# Patient Record
Sex: Male | Born: 1935 | Race: Black or African American | Hispanic: No | Marital: Married | Smoking: Never smoker
Health system: Southern US, Community
[De-identification: ages and names within clinical notes are randomized; demographics above are authoritative.]

## PROBLEM LIST (undated history)

## (undated) DIAGNOSIS — I651 Occlusion and stenosis of basilar artery: Secondary | ICD-10-CM

## (undated) DIAGNOSIS — R296 Repeated falls: Secondary | ICD-10-CM

## (undated) DIAGNOSIS — I639 Cerebral infarction, unspecified: Secondary | ICD-10-CM

## (undated) DIAGNOSIS — Q892 Congenital malformations of other endocrine glands: Secondary | ICD-10-CM

## (undated) DIAGNOSIS — IMO0002 Reserved for concepts with insufficient information to code with codable children: Secondary | ICD-10-CM

## (undated) DIAGNOSIS — D649 Anemia, unspecified: Secondary | ICD-10-CM

## (undated) DIAGNOSIS — I663 Occlusion and stenosis of cerebellar arteries: Secondary | ICD-10-CM

## (undated) DIAGNOSIS — F05 Delirium due to known physiological condition: Secondary | ICD-10-CM

## (undated) DIAGNOSIS — I10 Essential (primary) hypertension: Secondary | ICD-10-CM

## (undated) DIAGNOSIS — I6629 Occlusion and stenosis of unspecified posterior cerebral artery: Secondary | ICD-10-CM

## (undated) DIAGNOSIS — Z66 Do not resuscitate: Secondary | ICD-10-CM

## (undated) DIAGNOSIS — G9340 Encephalopathy, unspecified: Secondary | ICD-10-CM

## (undated) DIAGNOSIS — D696 Thrombocytopenia, unspecified: Secondary | ICD-10-CM

## (undated) DIAGNOSIS — F0390 Unspecified dementia without behavioral disturbance: Secondary | ICD-10-CM

## (undated) DIAGNOSIS — G5630 Lesion of radial nerve, unspecified upper limb: Secondary | ICD-10-CM

## (undated) DIAGNOSIS — R945 Abnormal results of liver function studies: Secondary | ICD-10-CM

## (undated) DIAGNOSIS — I2510 Atherosclerotic heart disease of native coronary artery without angina pectoris: Secondary | ICD-10-CM

## (undated) DIAGNOSIS — R7989 Other specified abnormal findings of blood chemistry: Secondary | ICD-10-CM

## (undated) DIAGNOSIS — G2401 Drug induced subacute dyskinesia: Secondary | ICD-10-CM

## (undated) DIAGNOSIS — K831 Obstruction of bile duct: Secondary | ICD-10-CM

## (undated) HISTORY — PX: CORONARY ANGIOPLASTY: SHX604

## (undated) HISTORY — PX: CARDIAC SURGERY: SHX584

---

## 2002-08-25 ENCOUNTER — Inpatient Hospital Stay (HOSPITAL_COMMUNITY): Admission: EM | Admit: 2002-08-25 | Discharge: 2002-08-27 | Payer: Self-pay | Admitting: Emergency Medicine

## 2002-08-25 ENCOUNTER — Encounter: Payer: Self-pay | Admitting: Emergency Medicine

## 2002-08-26 ENCOUNTER — Encounter: Payer: Self-pay | Admitting: Family Medicine

## 2002-08-26 ENCOUNTER — Encounter: Payer: Self-pay | Admitting: Neurology

## 2002-08-28 ENCOUNTER — Encounter (HOSPITAL_COMMUNITY): Admission: RE | Admit: 2002-08-28 | Discharge: 2002-09-27 | Payer: Self-pay | Admitting: Family Medicine

## 2002-09-30 ENCOUNTER — Encounter (HOSPITAL_COMMUNITY): Admission: RE | Admit: 2002-09-30 | Discharge: 2002-10-30 | Payer: Self-pay | Admitting: Family Medicine

## 2003-02-24 ENCOUNTER — Ambulatory Visit (HOSPITAL_COMMUNITY): Admission: RE | Admit: 2003-02-24 | Discharge: 2003-02-24 | Payer: Self-pay | Admitting: Family Medicine

## 2003-09-25 ENCOUNTER — Ambulatory Visit (HOSPITAL_COMMUNITY): Admission: RE | Admit: 2003-09-25 | Discharge: 2003-09-25 | Payer: Self-pay | Admitting: Family Medicine

## 2004-03-23 ENCOUNTER — Ambulatory Visit (HOSPITAL_COMMUNITY): Admission: RE | Admit: 2004-03-23 | Discharge: 2004-03-23 | Payer: Self-pay | Admitting: Family Medicine

## 2004-08-12 ENCOUNTER — Ambulatory Visit: Payer: Self-pay | Admitting: Internal Medicine

## 2004-08-24 ENCOUNTER — Ambulatory Visit (HOSPITAL_COMMUNITY): Admission: RE | Admit: 2004-08-24 | Discharge: 2004-08-24 | Payer: Self-pay | Admitting: Internal Medicine

## 2004-08-24 ENCOUNTER — Encounter (INDEPENDENT_AMBULATORY_CARE_PROVIDER_SITE_OTHER): Payer: Self-pay | Admitting: Internal Medicine

## 2004-08-24 ENCOUNTER — Ambulatory Visit: Payer: Self-pay | Admitting: Internal Medicine

## 2004-08-26 ENCOUNTER — Ambulatory Visit (HOSPITAL_COMMUNITY): Admission: RE | Admit: 2004-08-26 | Discharge: 2004-08-26 | Payer: Self-pay | Admitting: Internal Medicine

## 2004-09-10 DIAGNOSIS — I2510 Atherosclerotic heart disease of native coronary artery without angina pectoris: Secondary | ICD-10-CM

## 2004-09-10 HISTORY — DX: Atherosclerotic heart disease of native coronary artery without angina pectoris: I25.10

## 2004-09-22 ENCOUNTER — Encounter: Payer: Self-pay | Admitting: Emergency Medicine

## 2004-09-22 ENCOUNTER — Ambulatory Visit: Payer: Self-pay | Admitting: Internal Medicine

## 2004-09-22 ENCOUNTER — Inpatient Hospital Stay (HOSPITAL_COMMUNITY): Admission: AD | Admit: 2004-09-22 | Discharge: 2004-09-26 | Payer: Self-pay | Admitting: Cardiovascular Disease

## 2004-09-23 ENCOUNTER — Encounter (INDEPENDENT_AMBULATORY_CARE_PROVIDER_SITE_OTHER): Payer: Self-pay | Admitting: Cardiology

## 2005-05-26 ENCOUNTER — Ambulatory Visit: Payer: Self-pay | Admitting: Internal Medicine

## 2005-07-07 ENCOUNTER — Ambulatory Visit: Payer: Self-pay | Admitting: Internal Medicine

## 2005-09-18 IMAGING — US US ABDOMEN COMPLETE
1 series · 13 of 25 positions shown · non-contrast
Comparison: none

CLINICAL DATA: Anorexia.  Indigestion.  Weight loss.  Attention to gallbladder.
ABDOMEN ULTRASOUND:
TECHNIQUE: Complete abdominal ultrasound examination was performed including evaluation of the liver, gallbladder, bile ducts, pancreas, kidneys, spleen, IVC, and abdominal aorta.
Gallbladder is contracted and appears to have a thickened wall.  Small amount of pericholecystic fluid.  Negative ultrasonographic Murphy?s sign.  Reportedly, the patient has not eaten.  Common duct measures 3.1 mm in diameter.  Unremarkable pancreas and spleen.  Spleen measures 8.7 cm in length.  Right and left kidneys measure 10.8 and 10.2 cm in length respectively.  There are two cysts in the upper pole of the left kidney, one measuring 1.0 x 1.5 x 1.6 cm and the other 1.5 x 1.5 x 1.6 cm.  There is a cyst in the lower pole left kidney measuring 1.0 x 1.0 x 1.2 cm.  Right and left kidneys measure 10.8 cm and 10.2 cm in length respectively.  Patent IVC.  Abdominal aortic maximum transverse diameter is 1.9 cm.

[Series 1: unknown · 0.33mm/px · 13 of 93 slices shown]
[im 1/93]
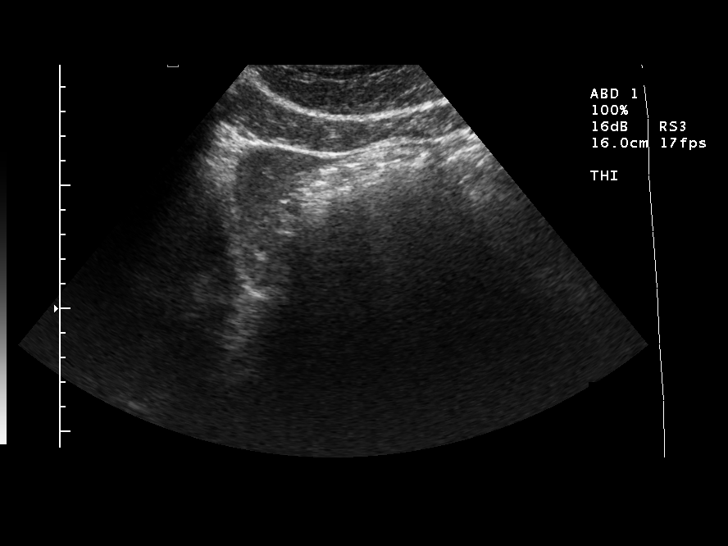
[im 8/93]
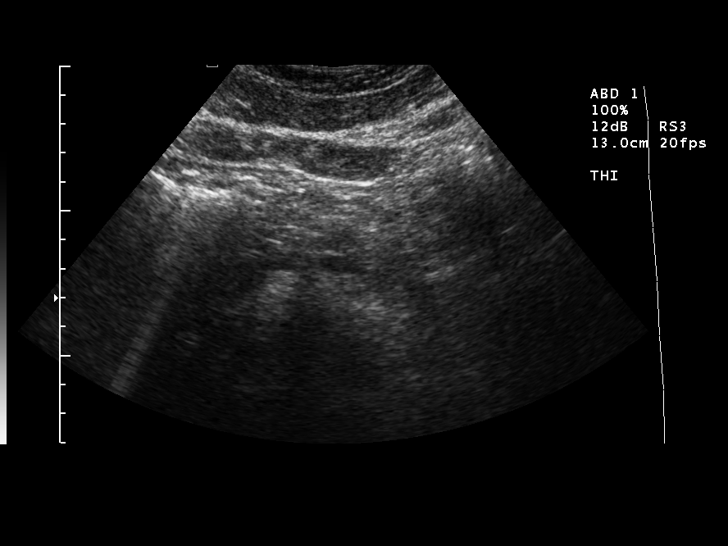
[im 16/93]
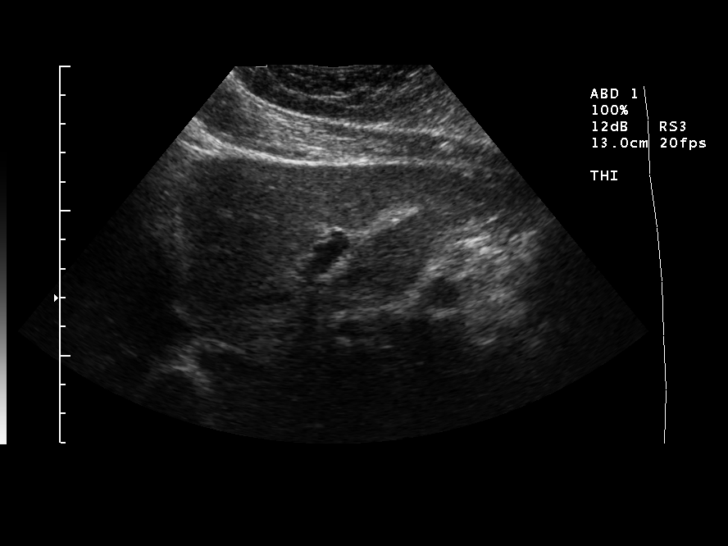
[im 24/93]
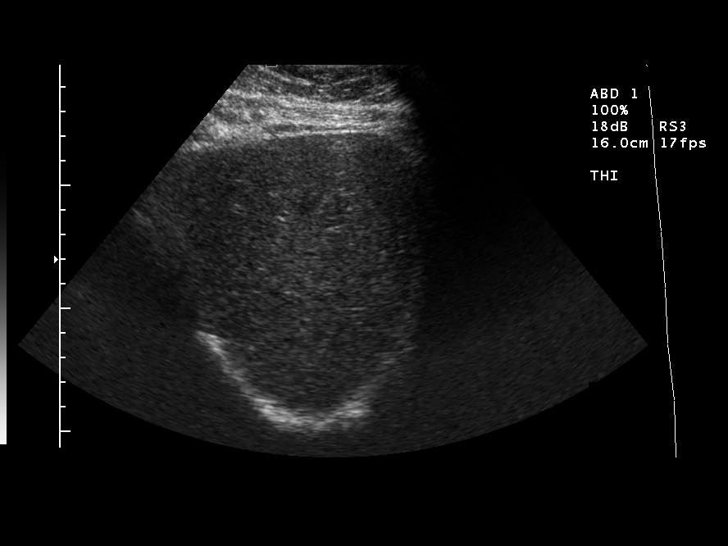
[im 31/93]
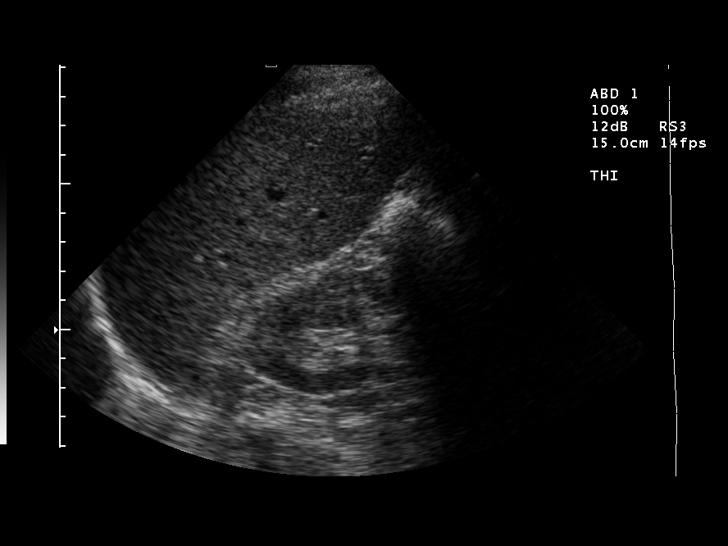
[im 39/93]
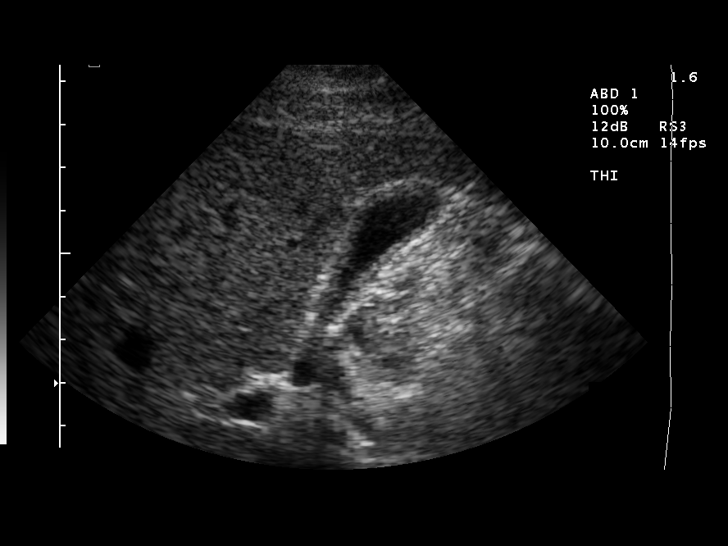
[im 47/93]
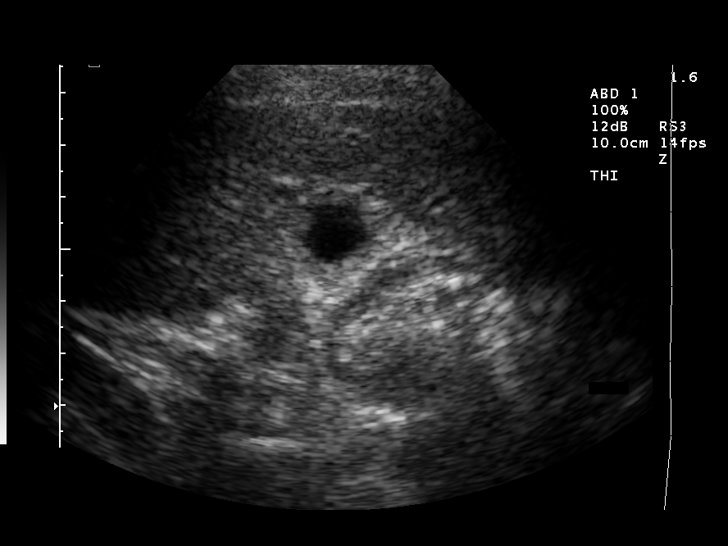
[im 54/93]
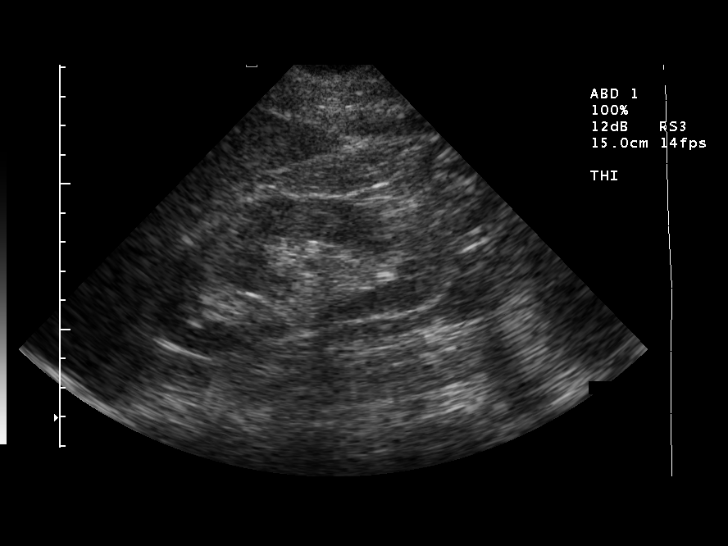
[im 62/93]
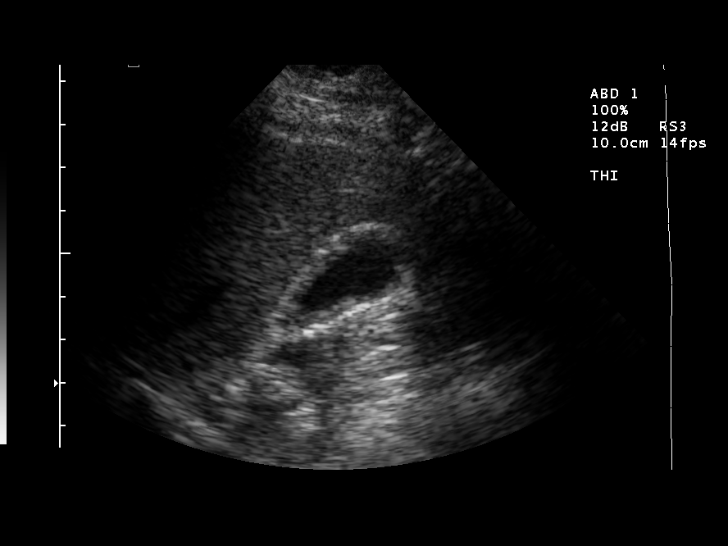
[im 70/93]
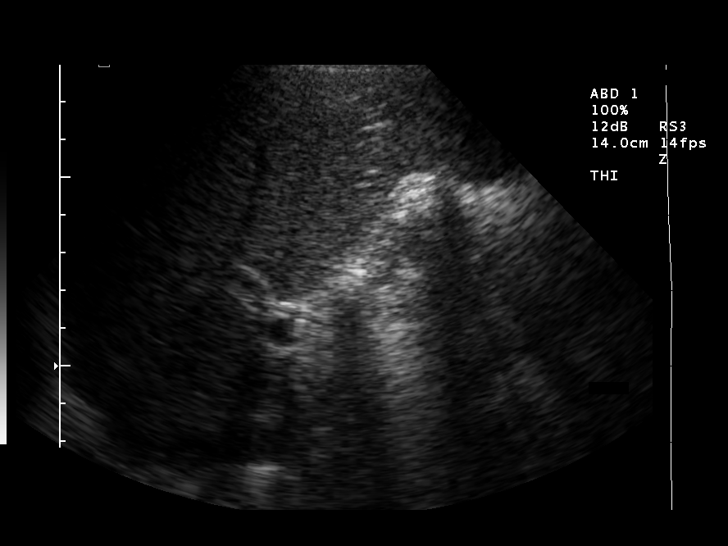
[im 77/93]
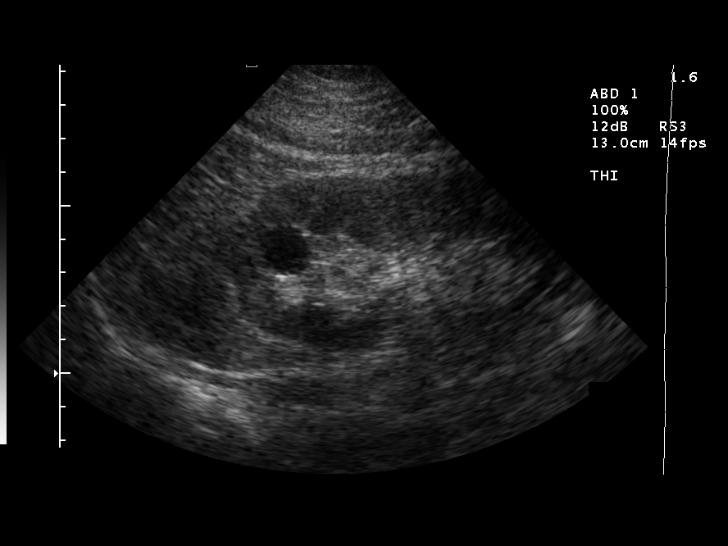
[im 85/93]
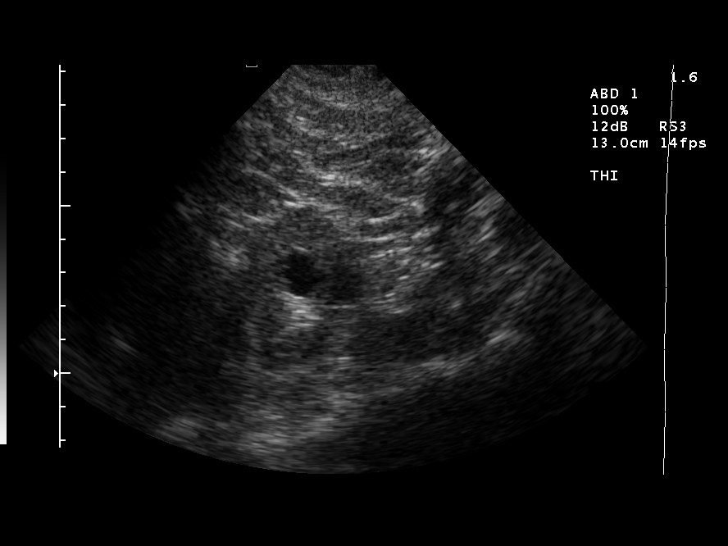
[im 93/93]
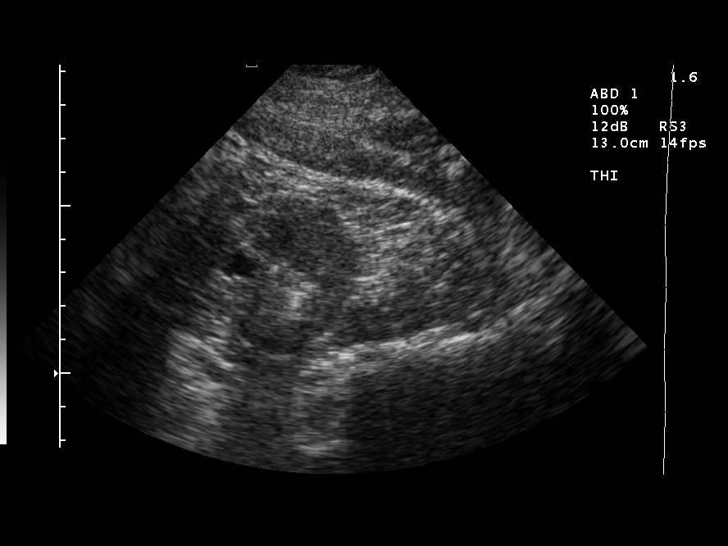

[13 of 25 positions shown; findings below may reference images not displayed]

IMPRESSION: Contracted thick walled gallbladder in a patient who is reportedly fasting.  Is there clinical concern for chronic gallbladder disease?  No gallstones.  No biliary ductal dilatation.  Unremarkable pancreas.  See comments above.

## 2005-12-23 ENCOUNTER — Ambulatory Visit: Payer: Self-pay | Admitting: Internal Medicine

## 2006-05-05 ENCOUNTER — Observation Stay (HOSPITAL_COMMUNITY): Admission: EM | Admit: 2006-05-05 | Discharge: 2006-05-08 | Payer: Self-pay | Admitting: Emergency Medicine

## 2006-09-13 ENCOUNTER — Ambulatory Visit: Payer: Self-pay | Admitting: Internal Medicine

## 2006-09-19 ENCOUNTER — Encounter: Payer: Self-pay | Admitting: Internal Medicine

## 2006-09-19 ENCOUNTER — Ambulatory Visit (HOSPITAL_COMMUNITY): Admission: RE | Admit: 2006-09-19 | Discharge: 2006-09-19 | Payer: Self-pay | Admitting: Internal Medicine

## 2006-09-19 ENCOUNTER — Ambulatory Visit: Payer: Self-pay | Admitting: Internal Medicine

## 2006-10-11 ENCOUNTER — Ambulatory Visit: Payer: Self-pay | Admitting: Internal Medicine

## 2007-01-17 ENCOUNTER — Ambulatory Visit (HOSPITAL_COMMUNITY): Admission: RE | Admit: 2007-01-17 | Discharge: 2007-01-17 | Payer: Self-pay | Admitting: Family Medicine

## 2007-12-14 ENCOUNTER — Inpatient Hospital Stay (HOSPITAL_COMMUNITY): Admission: EM | Admit: 2007-12-14 | Discharge: 2007-12-17 | Payer: Self-pay | Admitting: Emergency Medicine

## 2007-12-17 ENCOUNTER — Encounter (INDEPENDENT_AMBULATORY_CARE_PROVIDER_SITE_OTHER): Payer: Self-pay | Admitting: Internal Medicine

## 2007-12-25 ENCOUNTER — Encounter (HOSPITAL_COMMUNITY): Admission: RE | Admit: 2007-12-25 | Discharge: 2008-01-10 | Payer: Self-pay | Admitting: Internal Medicine

## 2008-01-15 ENCOUNTER — Encounter (HOSPITAL_COMMUNITY): Admission: RE | Admit: 2008-01-15 | Discharge: 2008-01-22 | Payer: Self-pay | Admitting: Internal Medicine

## 2008-11-28 ENCOUNTER — Emergency Department (HOSPITAL_COMMUNITY): Admission: EM | Admit: 2008-11-28 | Discharge: 2008-11-28 | Payer: Self-pay | Admitting: Emergency Medicine

## 2008-12-15 ENCOUNTER — Ambulatory Visit (HOSPITAL_COMMUNITY): Admission: RE | Admit: 2008-12-15 | Discharge: 2008-12-15 | Payer: Self-pay | Admitting: Family Medicine

## 2009-04-02 ENCOUNTER — Ambulatory Visit (HOSPITAL_COMMUNITY): Admission: RE | Admit: 2009-04-02 | Discharge: 2009-04-02 | Payer: Self-pay | Admitting: Family Medicine

## 2009-06-05 ENCOUNTER — Encounter (HOSPITAL_COMMUNITY): Admission: RE | Admit: 2009-06-05 | Discharge: 2009-07-05 | Payer: Self-pay | Admitting: Family Medicine

## 2009-06-05 ENCOUNTER — Ambulatory Visit (HOSPITAL_COMMUNITY): Admission: RE | Admit: 2009-06-05 | Discharge: 2009-06-05 | Payer: Self-pay | Admitting: Family Medicine

## 2009-09-07 ENCOUNTER — Emergency Department (HOSPITAL_COMMUNITY): Admission: EM | Admit: 2009-09-07 | Discharge: 2009-09-07 | Payer: Self-pay | Admitting: Emergency Medicine

## 2010-03-13 ENCOUNTER — Emergency Department (HOSPITAL_COMMUNITY)
Admission: EM | Admit: 2010-03-13 | Discharge: 2010-03-13 | Disposition: A | Discharge status: Home / Self Care | Payer: Medicare Other | Attending: Emergency Medicine | Admitting: Emergency Medicine

## 2010-03-13 ENCOUNTER — Emergency Department (HOSPITAL_COMMUNITY): Payer: Medicare Other

## 2010-03-13 DIAGNOSIS — W1809XA Striking against other object with subsequent fall, initial encounter: Secondary | ICD-10-CM | POA: Insufficient documentation

## 2010-03-13 DIAGNOSIS — Y92009 Unspecified place in unspecified non-institutional (private) residence as the place of occurrence of the external cause: Secondary | ICD-10-CM | POA: Insufficient documentation

## 2010-03-13 DIAGNOSIS — I10 Essential (primary) hypertension: Secondary | ICD-10-CM | POA: Insufficient documentation

## 2010-03-13 DIAGNOSIS — S0990XA Unspecified injury of head, initial encounter: Secondary | ICD-10-CM | POA: Insufficient documentation

## 2010-03-13 DIAGNOSIS — I252 Old myocardial infarction: Secondary | ICD-10-CM | POA: Insufficient documentation

## 2010-03-13 DIAGNOSIS — S0100XA Unspecified open wound of scalp, initial encounter: Secondary | ICD-10-CM | POA: Insufficient documentation

## 2010-03-13 DIAGNOSIS — F0390 Unspecified dementia without behavioral disturbance: Secondary | ICD-10-CM | POA: Insufficient documentation

## 2010-03-13 DIAGNOSIS — Z79899 Other long term (current) drug therapy: Secondary | ICD-10-CM | POA: Insufficient documentation

## 2010-04-14 LAB — DIFFERENTIAL
Basophils Absolute: 0 K/uL (ref 0.0–0.1)
Basophils Relative: 0 % (ref 0–1)
Eosinophils Absolute: 0 K/uL (ref 0.0–0.7)
Eosinophils Relative: 0 % (ref 0–5)
Lymphocytes Relative: 4 % — ABNORMAL LOW (ref 12–46)
Lymphs Abs: 0.5 K/uL — ABNORMAL LOW (ref 0.7–4.0)
Monocytes Absolute: 1.3 10*3/uL — ABNORMAL HIGH (ref 0.1–1.0)
Monocytes Relative: 10 % (ref 3–12)
Neutro Abs: 11.3 10*3/uL — ABNORMAL HIGH (ref 1.7–7.7)
Neutrophils Relative %: 86 % — ABNORMAL HIGH (ref 43–77)

## 2010-04-14 LAB — URINALYSIS, ROUTINE W REFLEX MICROSCOPIC
Bilirubin Urine: NEGATIVE
Glucose, UA: NEGATIVE mg/dL
Hgb urine dipstick: NEGATIVE
Nitrite: NEGATIVE
Protein, ur: NEGATIVE mg/dL
Specific Gravity, Urine: 1.015 (ref 1.005–1.030)
Urobilinogen, UA: 0.2 mg/dL (ref 0.0–1.0)
pH: 5.5 (ref 5.0–8.0)

## 2010-04-14 LAB — CBC
HCT: 39.1 % (ref 39.0–52.0)
Hemoglobin: 13.1 g/dL (ref 13.0–17.0)
MCHC: 33.6 g/dL (ref 30.0–36.0)
MCV: 95.2 fL (ref 78.0–100.0)
Platelets: 103 10*3/uL — ABNORMAL LOW (ref 150–400)
RBC: 4.1 MIL/uL — ABNORMAL LOW (ref 4.22–5.81)
RDW: 11.8 % (ref 11.5–15.5)
WBC: 13.1 K/uL — ABNORMAL HIGH (ref 4.0–10.5)

## 2010-04-14 LAB — COMPREHENSIVE METABOLIC PANEL
AST: 35 U/L (ref 0–37)
Albumin: 3.8 g/dL (ref 3.5–5.2)
BUN: 10 mg/dL (ref 6–23)
Creatinine, Ser: 1.21 mg/dL (ref 0.4–1.5)
GFR calc Af Amer: >60 mL/min (ref >60)
Potassium: 4 mEq/L (ref 3.5–5.1)
Total Protein: 7.1 g/dL (ref 6.0–8.3)

## 2010-04-14 LAB — COMPREHENSIVE METABOLIC PANEL WITH GFR
ALT: 48 U/L (ref 0–53)
Alkaline Phosphatase: 67 U/L (ref 39–117)
CO2: 29 meq/L (ref 19–32)
Calcium: 9.2 mg/dL (ref 8.4–10.5)
Chloride: 105 meq/L (ref 96–112)
GFR calc non Af Amer: 58 mL/min — ABNORMAL LOW (ref >60)
Glucose, Bld: 95 mg/dL (ref 70–99)
Sodium: 141 meq/L (ref 135–145)
Total Bilirubin: 0.9 mg/dL (ref 0.3–1.2)

## 2010-04-14 LAB — CULTURE, BLOOD (ROUTINE X 2)
Culture: NO GROWTH
Culture: NO GROWTH
Report Status: 11242010
Report Status: 11242010

## 2010-04-14 LAB — URINE CULTURE
Colony Count: NO GROWTH
Culture: NO GROWTH

## 2010-05-25 NOTE — Assessment & Plan Note (Signed)
NAMEMarland Kitchen  [name], [name]               CHART#:  [id number]   DATE:  10/11/2006                       DOB:  May 15, 1929   CHIEF COMPLAINT:  Followup colonoscopy.   The patient is a 77 year old African-American male with a history of  alternating bowel habits between normal and constipation with some  intermittent bright red blood per rectum.  He underwent a colonoscopy by  Dr. Jena Gauss on 09/19/2006.  He was found to have sigmoid diverticula and a  tubular adenoma in the mid sigmoid colon which was removed.  He had an  otherwise normal exam.  His wife tells me he has been doing well since  colonoscopy.  He denies any complaints today.  He has been eating less  and felt that he needed to lose weight due to his gastroesophageal  reflux disease.  His now down to 140 pounds.  He denies any abdominal  pain, denies any nausea or vomiting, denies any heartburn or indigestion  so long as he takes his Zegerid 40 mg daily.  He denies any further  rectal bleeding or melena.  He tells me he is trying to exercise daily  on the treadmill.   CURRENT MEDICATIONS:  See the list from October 11, 2006.   ALLERGIES:  VOLTAREN AND ARTHRITIS MEDS.   OBJECTIVE:  VITAL SIGNS:  Weight 140 pounds, height 63 inches, temp  97.7, blood pressure 110/60 and pulse 60.  GENERAL:  The patient is a 77 year old African-American male who is  alert, oriented, pleasant and cooperative, no acute distress.  HEENT:  Sclerae clear, anicteric.  Conjunctivae pink.  Oropharynx pink  and moist without any lesions.  NECK:  Supple.  He does have a large lipoma.  CHEST:  Heart regular rate and rhythm, normal S1-S2.  ABDOMEN:  Positive bowel sounds x4, no bruits auscultated.  Soft,  nontender, nondistended,.  No palpable masses or hepatosplenomegaly.  No  rebound, tenderness, guarding.  EXTREMITIES:  Without clubbing or edema bilaterally.  He does have right-  sided weakness.   ASSESSMENT:  1. The patient is a 77 year old male with  chronic gastroesophageal      reflux disease, well controlled on proton pump inhibitor.  2. He has chronic constipation, much better with the use of stool      softeners.  He can add dietary fiber.  3. I suspect his bright red rectal bleeding was related to benign      anorectal source.  Recent colonoscopy was reassuring.  He does have      a tubular adenoma and will need followup in 5 years.   PLAN:  1. Colonoscopy, September of 2013.  2. Continue Zegerid 40 mg daily.  I have given his wife a rebate      today.  3. Continue stool softeners and can add fiber supplement daily.  4. He is to attempt to maintain his weight as his ideal body weight at      this time.       Lorenza Burton, N.P.  Electronically Signed     R. Roetta Sessions, M.D.  Electronically Signed    KJ/MEDQ  D:  10/11/2006  T:  10/11/2006  Job:  [id number]   cc:   [name], M.D.

## 2010-05-25 NOTE — H&P (Signed)
NAMEGREGOR, [name]:  [id number]   MEDICAL RECORD NO.:  [id number]          PATIENT TYPE:  INP   LOCATION:  A318                          FACILITY:  [facility]   PHYSICIAN:  Osvaldo Shipper, MD     DATE OF BIRTH:  02-Jun-1929   DATE OF ADMISSION:  12/14/2007  DATE OF DISCHARGE:  LH                              HISTORY & PHYSICAL   The patient's family doctor is Dr. Yetta Numbers, and he also followed by  [facility] cardiologist on an as-needed basis.   ADMISSION DIAGNOSES:  1. Lower extremity weakness and confusion, possible stroke.  2. History of coronary artery disease, status-post myocardial      infraction.  3. History of hypertension.  4. History of stroke 3 or 4 years ago.   CHIEF COMPLAINT:  Unsteady gait and confusion since this morning.   HISTORY OF PRESENT ILLNESS:  This patient is a 77 year old African  American male who has a history of coronary artery disease status post  MI.  He sustained a stroke which affected his right side a few years  ago, maybe 5-6 years ago, and has a history of hypertension, who was in  his usual state of health until this morning when he woke and his wife  found him to be somewhat confused.  She felt that his speech was  abnormal although she is unable to clearly specify what kind of  abnormality it was.  She also found him unsteady on his gait with a  tendency to fall, and he indeed did fall when he was outside his house  this morning.  Did not have any syncopal episodes.  He denies any chest  pain, shortness of breath, headache, nausea, vomiting.  Denies any fever  or chills.  According to his wife, he was also disoriented since this  morning.  He had similar symptoms when he was diagnosed with a stroke a  few years ago.   MEDICATIONS AT HOME:  1. Simvastatin 40 mg once a day.  2. Xanax 0.5 mg b.i.d. as needed.  3. Ramipril 10 mg once a day.  4. Metoprolol 25 mg t.i.d.  5. Plavix 75 mg once a  day.  6. Aspirin 162 mg once a day.  7. Omeprazole 20 mg once a day.  8. Stool softeners once a day.   ALLERGIES:  NSAIDs WHICH CAUSE NEPHROTOXICITY.   PAST MEDICAL HISTORY:  1. Coronary artery disease status post MI in 2006.  He underwent a      stent placement to his proximal LAD.  This was done by [facility] cardiologist group.  2. He also was diagnosed with a stroke in August of 2004, and at that      time he had right hemiataxia.  He, during that admission, had an      MRA of his brain which revealed no evidence for large vessel      occlusion or significant stenosis, so basically no significant      abnormalities were noted at that time.  Carotid Dopplers were  done,      too, which revealed no significant stenosis.  He had an      echocardiogram done at that time which revealed no embolic source,      mild LVH was noted with normal systolic function.  3. He has had no other surgical procedures done.  4. He had a colonoscopy done last year, September 2008, which showed a      polyp in the midsigmoid colon.  He had the colonoscopy for rectal      bleeding. This polyp was found to be a tubular adenoma.   SOCIAL HISTORY:  He lives in [place] with his wife.  He, prior to  today, was independent with his daily activities, did not require any  assistive devices.  He does not smoke nor consumption of alcohol.  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   FAMILY HISTORY:  Consists of strokes in his sisters and mother.  One of  his brothers also had coronary artery disease.   REVIEW OF SYMPTOMS:  Unable to do because of the patient's confusion.   PHYSICAL EXAMINATION:  VITAL SIGNS:  Temperature 97.7, blood pressure  157/69, heart rate 56 to 65, respiratory rate 16, saturation 99% on room  air.  GENERAL:  Well-developed, well-nourished black male who appears to be  confused but in no distress.  HEENT:  There is pallor, no icterus, oral mucous  membrane is moist, no  oral lesions are noted.  NECK:  Soft and supple.  No thyromegaly is appreciated.  He has about an  8-cm x 8-cm cystic lesion over the anterior part of his upper chest  which is quite to soft palpation, apparently has been present since  childhood.  LUNGS:  Clear to auscultation bilaterally.  No wheezing, rales, or  rhonchi.  CARDIOVASCULAR:  S1, S2 are normal and regular.  No murmurs appreciated.  No S3, S4.  No rubs.  No carotid bruits noted.  No pedal edema is  present.  ABDOMEN:  Soft, nontender, nondistended.  Bowel sounds are present.  No  masses or organomegaly were appreciated.  GU:  Deferred.  MUSCULOSKELETAL:  Unremarkable.  NEUROLOGIC:  He is alert, oriented to place, person, month, but not to  year, date, or time.  His cranial nerve examination did not reveal any  abnormalities.  He did not have any pronator drift although his finger-  to-nose was somewhat deficient on the right side.  Cerebellar signs were  remarkably abnormal in the right lower extremity, not so much in the  right upper extremity compared to the left.  Sensory deficits are  appreciated in the right lower extremity. Motor strength:  He has 4/5  strength in the right lower extremity, 5/5 in the left.  Upper extremity  strength was equal.  Plantars were equivocal bilaterally.  Reflexes  normal bilaterally.  At this time I did not assess the gait.   LABORATORY DATA:  His CBC reveals hemoglobin of 12.7, MCV 96, platelet  count is 132.  Rest of the parameters are normal.  Coagulations are  normal.  Glucose was 82.  His CMET is basically unremarkable except for  an albumin of 3.3.  Cardiac markers are negative x2.  BNP is 31.  He had  a CT scan of his head which revealed advanced chronic small vessel  ischemic changes.  No evidence of acute stroke or other acute  intracranial process.  Chest x-ray did not show any acute disease as  well.  EKG was done which shows evidence for sinus rhythm  at a rate of  64.  Intervals appear to be in the normal range.  Normal axis is  present.  No Q-waves are identified.  No acute ST or T-wave changes are  identified.   ASSESSMENT:  This is a 77 year old African American male who was in his  usual state of health until this morning when he started developing  confusion and lower extremity weakness and then gait impairment.  His  symptomatology is consistent with possible acute stroke.   PLAN:  1. Gait abnormality and confusion:  Possible stroke.  We will obtain      an MRI of the brain in this individual.  Carotid Dopplers, 2-D      echocardiogram will also be checked.  We will check homocysteine      levels, B12, RPR.  Continue with aspirin and Plavix for now.      Depending on the MRI findings, we may have to obtain further input      from neurology.  PT, OT, and speech therapy will be involved as      well.  We will put on a dysphagia 3 diet for now.  Blood pressure      will be controlled using his current antihypertensive agent.  2. Coronary artery disease, status post MI, stable.  Continue with his      current cardiac medications.  3. DVT prophylaxis will be initiated.   Further management decisions will be depend on results of further  testing and patient's response to treatment.      Osvaldo Shipper, MD  Electronically Signed     GK/MEDQ  D:  12/14/2007  T:  12/14/2007  Job:  [id number]   cc:   Kirk Ruths, M.D.  Fax: [phone]

## 2010-05-25 NOTE — H&P (Signed)
NAME:  [name], [name]              ACCOUNT NO.:  [id number]   MEDICAL RECORD NO.:  [id number]          PATIENT TYPE:  AMB   LOCATION:                                FACILITY:  [facility]   PHYSICIAN:  R. Roetta Sessions, M.D. DATE OF BIRTH:  [birth year]   DATE OF ADMISSION:  DATE OF DISCHARGE:  LH                              HISTORY & PHYSICAL   CHIEF COMPLAINT:  Change in bowel habits, Hemoccult-positive stool.   [name] is a 77 year old gentleman followed here for  gastroesophageal reflux disease.  Also complains of progressing  constipation over the past year or so.  His reflux symptoms have been  well controlled on Zegerid 40 mg orally daily.  He notes progressive  constipation over the past year.  He has not really been taking anything  for it aside from a stool softener daily.  He has had some intermittent  blood per rectum on occasion but denies any in the past several months.  He was last seen here December 23, 2005.  He was instructed to return  some Hemoccult cards which never came back.  It is notable he had a  colonoscopy back in August 2006 at the time of the EGD.  He was found to  have a normal rectum and a mini-polyp in the hepatic flexure which was  removed, which turned out to be benign.  Otherwise, the rectum and colon  and terminal ileum appeared normal.  He has had issues with weight loss  in the past.  In fact, he has lost down from 164 pounds in December to  146 pounds currently.  He denies early satiety, nausea or vomiting.  He  says since his stroke, things have never really been the same.  He is  not having any odynophagia or dysphagia.   PAST MEDICAL HISTORY:  Significant for:  1. Hypertension.  2. Seasonal allergies.  3. GERD.  4. History of left hemisphere CVA, right-sided weakness.  5. Renal insufficiency.  6. 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   CURRENT MEDICATIONS:  1. Zegerid 40 mg daily.  2. Metoprolol 25 mg t.i.d.  3. Plavix 75 mg  once daily.  4. BuSpar 10 mg b.i.d.  5. Altace 10 mg daily.  6. NitroQuick p.r.n.  7. ASA 81 mg daily.  8. Xanax 0.5 mg b.i.d.  9. Zocor 40 mg daily.  10.Lyrica 50 mg t.i.d.  11.Stool softener twice daily.  12.Antivert 25 mg t.i.d.  13.Razadyne ER 8 mg daily.   ALLERGIES:  No known drug allergies aside from he is intolerant for  NSAIDs.   FAMILY HISTORY:  Negative for chronic GI or liver disease.  Father died  age 68 with head and neck cancer.  Mother died age 53 with CVA.   REVIEW OF SYSTEMS:  No chest pain, no dyspnea on exertion, no fever,  chills, weight loss as outlined above.   PHYSICAL EXAMINATION:  A pleasant 77 year old gentleman resting,  accompanied by his wife.  Weight 146, height 5 feet 3, temp 98, BP  118/68, pulse 60.  SKIN:  Warm and  dry.  HEENT EXAM:  No scleral icterus.  Conjunctivae are pink.  CHEST:  Lungs are clear to auscultation.  CARDIAC EXAM:  Regular rate and rhythm without murmur, gallop, rub.  ABDOMEN:  Nondistended.  Positive bowel sounds.  Soft, nontender,  without appreciable mass or organomegaly.  EXTREMITIES:  Have no edema.  RECTAL EXAM:  Good sphincter tone.  No mass in the rectal vault.  Scant  brown stool is Hemoccult positive.   ADMITTING IMPRESSION:  Mr. [name] is a 77 year old gentleman  with alteration in his bowel habits in the way of constipation.  He has  had some intermittent blood per rectum but none in the past several  months.  He is Hemoccult positive.  It is good to know he had a  colonoscopy just over 2 years ago.  However, in this setting, I think it  would be wise to go ahead and recheck his colon given that he is  Hemoccult positive at this time.  His gastroesophageal reflux disease  symptoms seem to be well controlled on Zegerid.   He has lost a notable amount of weight in less than a year, the reasons  for this unclear at this time.  Will plan to perform colonoscopy in the  very near future.  Potential risks,  benefits, alternatives have been  reviewed, questions answered, he is agreeable.  Will make further  recommendations once colonoscopy has been performed.      [name], M.D.  Electronically Signed     RMR/MEDQ  D:  09/13/2006  T:  09/13/2006  Job:  [id number]   cc:   [name], M.D.  Fax: [phone]

## 2010-05-25 NOTE — Consult Note (Signed)
[name], [name]              ACCOUNT NO.:  [id number]   MEDICAL RECORD NO.:  [id number]          PATIENT TYPE:  INP   LOCATION:  A318                          FACILITY:  [facility]   PHYSICIAN:  Kofi A. Gerilyn Pilgrim, M.D. DATE OF BIRTH:  01-29-1929   DATE OF CONSULTATION:  12/17/2007  DATE OF DISCHARGE:                                 CONSULTATION   REASON FOR CONSULTATION:  Altered mental status and gait impairment.   HISTORY OF PRESENT ILLNESS:  The patient is a 77 year old black male who  has a baseline history of stroke in 2004 with the patient having right  hemiataxia.  The patient has continued to have the ataxia on the right  side, but the wife also indicates that the patient has had memory  problems since his stroke in 2004.  However, he has been cognitively  intact without much problems.  A few days ago, the patient developed the  acute onset of worsening cognition and gait impairment.  This happened  relatively acutely after the patient apparently went outside and  apparently got really cold.  He also has had more problems with gait  impairment over baseline and, indeed, did have 1 fall outside.  No clear  syncopal episode or loss of consciousness, although it appears that the  patient may have been outside by himself.  No headaches are reported.  No shortness of breath, nausea, vomiting, and no clear worsening of  focal neurological deficits.   PAST MEDICAL HISTORY:  Significant for left thalamic infarct in 2004,  hypertension, coronary artery disease, status post myocardial infarction  and mild memory impairment.   MEDICATIONS:  1. Simvastatin 40 mg daily.  2. Xanax 0.25 mg b.i.d.  3. Ramipril 10 mg daily.  4. Metoprolol 25 mg t.i.d.  5. Plavix 75 mg daily.  6. Aspirin 160 mg daily.  7. Omeprazole 20 mg daily.  8. Stool softeners.   ALLERGIES:  NON-STEROIDAL ANTI-INFLAMMATORY DRUGS, which cause  nephrotoxicity.   FAMILY HISTORY:  Positive for strokes and coronary  artery disease.   SOCIAL HISTORY:  He lives in [place] with his wife.  Again, the  patient ambulates very well without assistive devices.  No alcohol or  tobacco use.  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   REVIEW OF SYSTEMS:  As stated in the history of present illness.  The  wife also reports that he has had some continued confusion and problems  after being hospitalized.  He has been in the hospital now for 2 to 3  days, which makes the symptoms lasting for about 3 days or so.   PHYSICAL EXAMINATION:  CONSTITUTIONAL:  This is a pleasant elderly white  man in no acute distress.  VITAL SIGNS:  Temperature 98.4, pulse 60, respirations 16, blood  pressure 169/76.  MENTAL STATUS:  The patient currently is awake, alert, and lucid.  He is  oriented x2.  No dysarthria is noted.  Follows commands well.  Seems  cognitively unremarkable.  He does sort of talk a lot and is somewhat  talkative and tangential  at times.  ABDOMEN:  Soft.  EXTREMITIES:  Normal.  No edema.  CRANIAL NERVES:  Pupils are equal, round, and reactive to light and  accommodation.  Extraocular movements are full.  Visual fields are  intact.  Face and muscle strength symmetric.  Tongue midline.  Uvula  midline.  Should shrug is normal.  MOTOR EXAMINATION:  Shows slightly increased tone involving the right  arm and to some extend in the right leg.  He has normal bulk and  strength, however, throughout.  No pronator drift.  COORDINATION:  The patient is noted to have mild dysmetria of the right  upper extremity.  There is some dysmetria to some extent involving the  right leg, although this is less.  There is mild postural and action  tremor, high frequency, low amplitude involving the right upper  extremity.  Reflexes are slightly brisk.  Both toes go down.  SENSATION:  Shows hyper-analgesia involving the right upper and lower  extremity.  Gait is relatively unrevealing.  He is  slightly unsteady  starting out initially.  Otherwise unrevealing.   DIAGNOSTIC STUDIES:  MRI of the brain shows severe periventricular and  white matter ischemic changes that are chronic.  This scan was reviewed  in person.  There is no significant atrophy noted.  There is no evidence  of ventriculomegaly.  No evidence of acute infarct on diffusion imaging.  Sodium 138, potassium 4.3, chloride 104, CO2 30, BUN 11, creatinine 1.2.  Glucose 88.  Calcium 8.9.  Liver enzymes normal.  Urinalysis negative.  INR 1.1.  WBC 7.7, hemoglobin 12.7, platelet count 132,000.  RPR  nonreactive.  Vitamin B12 level 546.  TSH 0.95.   ASSESSMENT:  Somewhat of an enigma given the unremarkable MRI scan.  There is also nothing metabolic to explain the patient's symptoms.  He  has improved spontaneously, always raising the possibility of possible  seizures, which can occur after an infarct.  The time frame seems  inconsistent with a transient ischemic attack.  This, however, does not  rule out ischemic event.   RECOMMENDATIONS:  1. Continue with aspirin and Plavix for now.  Certainly can get rid of      one of these after a couple months unless this is needed for      coronary protection.  2. EEG.  3. Low dose Keppra.  4. The patient does not have to be kept in the hospital for the EEG.      Should follow up with me in about 2 to 4 weeks.  Thanks for this      consultation.      Kofi A. Gerilyn Pilgrim, M.D.  Electronically Signed     KAD/MEDQ  D:  12/17/2007  T:  12/17/2007  Job:  [id number]

## 2010-05-25 NOTE — Discharge Summary (Signed)
NAMEVIVAN, [name]:  [id number]   MEDICAL RECORD NO.:  [id number]          PATIENT TYPE:  INP   LOCATION:  A318                          FACILITY:  [facility]   PHYSICIAN:  Osvaldo Shipper, MD     DATE OF BIRTH:  1929/02/01   DATE OF ADMISSION:  12/14/2007  DATE OF DISCHARGE:  12/07/2009LH                               DISCHARGE SUMMARY   PRIMARY CARE PHYSICIAN:  Kirk Ruths, M.D. with [facility]   DISCHARGE DIAGNOSES:  1. Altered mental status, etiology unclear, rule out seizure activity.  2. Ataxia, unclear etiology.  3. History of coronary artery disease status post myocardial      infarction.  4. History of hypertension.  5. History of stroke.   Please review H and P dictated at the time of admission.   HISTORY OF PRESENT ILLNESS:  The patient is a 77 year old African  American male who was brought in for unsteady gait and confusion.  According to the wife this was a sudden change though she was not able  to give a clear history.  The patient was evaluated in the ED where he  underwent a CT head which did not show any acute stroke.  The patient  did have disorientation, he did not know the year or the date.  He was  able to identify his wife and he did know where he was.  He did have  periods of confusion during the hospitalization as well.  He underwent  MRI of the brain which did not show any acute intracranial  abnormalities.  However, advanced cerebral white matter disease was  noted which was thought to be likely secondary to severe chronic small  vessel ischemic but was note changed since 2008.  The patient was  ambulated by myself 2 days ago and he was able to walk though he was a  little bit unsteady.  The PT evaluation is pending at this time.  The  patient was seen by Dr. Gerilyn Pilgrim today who has ordered Keppra and has  ordered an EEG to be done as an outpatient.  Carotid Dopplers also  pending.  An echocardiogram was  done today, this is also pending.   So, actually the patient can go home once the Doppler study is done.  There is no need to wait for the results because the patient did not  have a stroke at this time.  Etiology for his symptoms are not very  clear.  The patient could have developed dementia, though according to  wife these symptoms were quite sudden.  The patient needs follow-up with  Dr. Gerilyn Pilgrim in 2-3 weeks which will be arranged.  The patient will be  sent home after the carotid Dopplers are done and right after the  patient is seen by physical therapy.   At this time wife says that she feels comfortable taking care of the  patient at home. His other medical issues remained completely stable  during this admission.   DISCHARGE PHYSICAL EXAMINATION:  VITAL SIGNS:  On the day of discharge  the patient is feeling  well, is still confused at times.  His heart rate  is 79 and his temperature 98.4, respiratory rate 18 blood pressure is  160/78, saturation 96% on room air.  GENERAL:  This is a well-developed, well-nourished black male in no  apparent distress.  LUNGS:  Clear to auscultation bilaterally.  No wheezing, rales or  rhonchi.  CARDIOVASCULAR:  S1 and S2 normal regular.  No murmurs appreciated.  No  S3-S4, no rubs, no bruits.  ABDOMEN:  Soft, nontender, nondistended.  Bowel sounds present.  No  masses or organomegaly is appreciated.  NEUROLOGY:  He does not have any clear focal deficits that are changed  from the time of admission.  His gait appears to have improved.   No labs have been done today.   So the patient is okay for discharge with close follow-up with his PMD  and the neurologist.   DISCHARGE MEDICATIONS:  1. Keppra 250 mg p.o. b.i.d. this is a new medication.  Otherwise he      may continue his older medications which include:  2. Simvastatin 40 mg daily.  3. Xanax 0.5 mg b.i.d. as needed.  4. Ramipril 10 mg daily.  5. Nitroglycerin as needed for chest  pain.  6. Metoprolol 25 mg t.i.d.  7. Plavix 75 mg daily.  8. Aspirin 162 mg daily.  9. Omeprazole 20 mg daily.  10.Stool softener once a day.   FOLLOWUP:  1. With Dr. Regino Schultze by the end of this week.  2. With Dr. Gerilyn Pilgrim in 2-3 weeks.  3. EEG to be arranged for some time this week.  We will make an      appointment.   STUDIES PENDING:  Include carotid Doppler, 2-D echocardiogram.   DIET:  Heart healthy.   PHYSICAL ACTIVITY:  Depending on what physical therapy tells [place].   Total time on this discharge encounter 35 minutes.      Osvaldo Shipper, MD  Electronically Signed     GK/MEDQ  D:  12/17/2007  T:  12/17/2007  Job:  [id number]   cc:   Kirk Ruths, M.D.  Fax: [phone]   Kofi A. Gerilyn Pilgrim, M.D.  Fax: [phone]

## 2010-05-25 NOTE — Op Note (Signed)
NAME:  [name], [name]              ACCOUNT NO.:  [id number]   MEDICAL RECORD NO.:  [id number]          PATIENT TYPE:  AMB   LOCATION:  DAY                           FACILITY:  [facility]   PHYSICIAN:  R. Roetta Sessions, M.D. DATE OF BIRTH:  11-Jul-1929   DATE OF PROCEDURE:  09/19/2006  DATE OF DISCHARGE:                               OPERATIVE REPORT   PROCEDURE:  Colonoscopy and snare polypectomy.   INDICATIONS FOR PROCEDURE:  A 77 year old gentleman with alternating  bowel habits between normal and constipation, some intermittent blood  per rectum recently.  He is hemoccult positive.  Colonoscopy a couple of  years ago demonstrated a tiny polyp with __________  which turned out to  be benign.  He has lost some weight recently.  He has no upper GI tract  symptoms.  Colonoscopy is now being done.  This approach has been  discussed with the patient at length.  Potential risks, benefits, and  alternatives have been reviewed and questions answered.  He is  agreeable.  Please see documentation in medical record.   DESCRIPTION OF PROCEDURE:  O2 saturation, blood pressure, pulse on this  patient were monitored throughout the entire procedure.  Conscious  sedation was Versed 4 mg IV, Demerol 100 mg IV in divided doses.   INSTRUMENT:  Pentax video chip system.   FINDINGS:  Digital rectal examination revealed no abnormalities.   Endoscopic findings; prep was adequate.  Colonic mucosa was surveyed  from the retrosigmoid junction through the left transverse and right  colon to the appendiceal orifice, ileocecal valve, and cecum and these  structures were well seen and photographed for the record.  Terminal  ileum was then noted at 10 cm.  From this level the scope was slowly and  cautiously withdrawn.  All previously mentioned mucosal surfaces were  again seen.  The patient had sigmoid diverticula and a 6 mm sessile  polyp in the mid sigmoid colon that was resected with hot snare cautery,  recovered through the scope.  The scope was pulled down to the rectum  and thorough examination of the rectal mucosa including a retroflexed  view of the anal verge demonstrated no abnormalities.  The patient  tolerated the procedure well and was reactivated in endoscopy.   IMPRESSION:  1. Normal rectum.  2. Sigmoid diverticula.  3. Polyp in mid sigmoid colon, status post hot snare resection.  4. Otherwise normal colon and normal terminal ileum.   I suspect the scant brown stool he has seen per rectum is likely  secondary to benign anorectal bleeding.  These findings are reassuring.   RECOMMENDATIONS:  Diverticulosis literature is provided to [name].  No aspirin or arthritis medications for 10 days.  I will plan to see him  back in 1 month and see how he is doing and we will go from there.      [name], M.D.  Electronically Signed     RMR/MEDQ  D:  09/19/2006  T:  09/20/2006  Job:  [id number]   cc:   Kirk Ruths, M.D.  Fax: [phone]

## 2010-05-28 NOTE — H&P (Signed)
NAME:  [name], [name]                        ACCOUNT NO.:  [id number]   MEDICAL RECORD NO.:  [id number]                   PATIENT TYPE:  INP   LOCATION:  A211                                 FACILITY:  [facility]   PHYSICIAN:  Corrie Mckusick, M.D.               DATE OF BIRTH:  1929-10-05   DATE OF ADMISSION:  08/25/2002  DATE OF DISCHARGE:                                HISTORY & PHYSICAL   ADMITTING DIAGNOSIS:  Question transient ischemic attack, right sided  numbness.   ADDITIONAL DIAGNOSIS:  Hypertension.   PRIMARY PHYSICIAN:  Kirk Ruths, M.D.   HISTORY OF PRESENT ILLNESS:  This 77 year old gentleman saw Dr. Regino Schultze last  week with right-sided upper and lower extremity numbness.  It was thought  that this could be a TIA and he was started on Plavix and aspirin.  He came  in to the emergency department with continued numbness and it was felt like  he should be admitted for further evaluation.  He had no other motor  deficits whatsoever.  He has no weakness, no visual changes, no speech  difficulties.  No other neurological complaints, at all, other than the  right upper and lower extremity numbness which is somewhat subjective.  No  headaches or other symptomatology. No chest pain or shortness of breath.  No  GI or GU symptomatology.   The patient does not visit the doctor too frequently and has not had blood  work or other workups done in the past.   PAST MEDICAL HISTORY:  Hypertension.   PAST SURGICAL HISTORY:  None.   MEDICATIONS:  1. Norvasc 5 mg daily.  2. Aspirin 325 daily.  3. Plavix 75 daily.   ALLERGIES:  No known drug allergies.   SOCIAL HISTORY:  Does not drink or smoke.   FAMILY HISTORY:  Significant for diabetes and cardiovascular including MIs  as well as CVA in his mother.   PHYSICAL EXAMINATION:  VITAL SIGNS:  Blood pressure 145/67.  O2 saturation  100% on room air.  Pulse 62, respirations 18.  GENERAL:  When I saw the patient he was  pleasant, talkative in no acute  distress.  HEENT:  Normocephalic, atraumatic.  Pupils equal react.  Extraocular  movements are intact.  Nasal and oropharynx are clear.  NECK:  Supple.  No lymphadenopathy.  There is a 5 cm clearly, fluid  collected cyst right at the sternal notch.  He said that this has been there  for his life.  CHEST:  Clear to auscultation bilaterally.  CARDIOVASCULAR:  Regular rate and rhythm.  Normal S1 and S2. No S3, S4,  murmurs or rubs.  ABDOMEN:  Soft, nontender, nondistended.  EXTREMITIES:  No signs of clubbing or erythema.  No edema.  NEUROLOGIC:  Cranial nerves II-XII are intact.  Strength is 5/5 throughout  and quite equal.  Reflexes +1 throughout.  Negative Babinski's.  More of a  subjective  numbness on the right side.  Pinprick and gross sensation is  intact.   LABORATORIES ON ADMISSION:  Head CT reported by the emergency department as  normal.  Negative for intracranial bleeds per Dr. Ernestina Penna.  White blood cell  count 7.9, hemoglobin 13.3, hematocrit 41.3, platelets 171.  PT 13.0, PTT  37, sodium 138, potassium 3.6, chloride 106, bicarb 28, glucose 95, BUN 14,  creatinine 1.1. LFTs normal.  CPK 112, CK/MB 1.6, troponin 0.05.   ASSESSMENT:  A 77 year old gentleman with hypertension and question of  hyperlipidemia who presents with right-sided numbness.   PLAN:  1. Admit for low-molecular weight heparin, 1 mg/kg dose q.12h.  2. Continue on current medications.  3. MRI of the brain.  4. Carotid Dopplers.  5. Ultrasound of the cyst in the neck and possible drainage even though it     has been lifelong.  6. Check TSH.  7. Echocardiogram.  8. Consult neurology.  9. Will continue to check 2 additional CPK, MB, and troponins.  10.      Will monitor.                                               Corrie Mckusick, M.D.    JCG/MEDQ  D:  08/25/2002  T:  08/25/2002  Job:  [id number]

## 2010-05-28 NOTE — Discharge Summary (Signed)
[name], [name] NO.:  [id number]   MEDICAL RECORD NO.:  [id number]          PATIENT TYPE:  OBV   LOCATION:  A214                          FACILITY:  [facility]   PHYSICIAN:  [name], M.D.    DATE OF BIRTH:  Aug 22, 1929   DATE OF ADMISSION:  05/05/2006  DATE OF DISCHARGE:  04/28/2008LH                               DISCHARGE SUMMARY   DISCHARGE DIAGNOSES:  1. Acute vertigo of questionable etiology.  No recurrence.  2. Intermittent mental status change compatible sundowning.  3. 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  4. History of atherosclerotic cardiovascular disease status post      stenting of his left anterior descending in 2006 following an      episode of successfully cardioverted ventricular tachycardia.  5. Gastroesophageal reflux disease.  6. Longstanding hypertension.  7. Mild anxiety disorder.   HISTORY OF PRESENT ILLNESS:  For details regarding admission, please  refer to the admitter.  Briefly, this is a 77 year old male with a  history as noted above who has had intermittent episodes of vertigo  possibly related to Lyrica after being put on Lyrica for apparent  neuropathic symptoms.  His dose had been recently decreased.   The day of admission, the patient took his medication and promptly fell  asleep.  Upon awakening, he went to mow the grass and while performing  this, he became overheated and very dizzy.  He was unable to stand and  was brought to the emergency room by his wife.  He had no chest pain,  shortness of breath, palpitations, headache, neurologic deficits other  than what is noted.   In the Emergency department, he underwent CT scanning of the brain which  was unremarkable except for old thalamic infarct with white matter  ischemic changes.  His blood pressure was essentially normal with no  significant orthostatic changes.  Hemogram and chemistries were  normal  except for a hematocrit of 0.8 which is baseline.  Cardiac markers  negative.  EKG nonacute.   The patient's symptoms did respond spontaneously.   The patient was admitted for further evaluation and therapy of above  noted symptom complex.   COURSE IN THE HOSPITAL:  The patient has had no significant recurrence  of his vertigo.  He underwent MRI scanning of his brain to rule out  recurrent infarct.  This was negative except for progressive increase in  small vessel disease type changes and old infarct in left thalamus.  He  also has some polypoid opacification of the inferior aspect of the right  maxillary sinus.   The patient has been getting confused in the evenings and been alert  during the day.  There has been no recurrent vertigo, syncope.  His rule  out MI protocol is negative and his rhythm has remained stable.   Currently, the patient is stable for discharge and will be followed  closely as an outpatient.  He may require neurologic consultation in the  future, though currently it appears that it would not be helpful.  DISCHARGE MEDICATIONS:  He is to continue his home medications which  include:  1. Zocor 40 mg daily.  2. Xanax 0.5 daily.  3. Docusate sodium 100 mg b.i.d.  4. Nitroglycerin p.r.n.  5. Zegerid 40 daily.  6. Ramipril 40/10 once daily.  7. Toprol succinate 100 mg once daily.  8. Plavix 75 mg daily.  9. Aspirin 81 daily.  He will discontinue Lyrica.   DISCHARGE FOLLOWUP:  He will be followed in 3 weeks as an outpatient.      [name], M.D.  Electronically Signed     MC/MEDQ  D:  05/08/2006  T:  05/08/2006  Job:  [id number]

## 2010-05-28 NOTE — Discharge Summary (Signed)
[name], BREW NO.:  [id number]   MEDICAL RECORD NO.:  [id number]          PATIENT TYPE:  OBV   LOCATION:  A214                          FACILITY:  [facility]   PHYSICIAN:  [name], M.D.    DATE OF BIRTH:  [birth year]   DATE OF ADMISSION:  05/05/2006  DATE OF DISCHARGE:  04/28/2008LH                               DISCHARGE SUMMARY   DISCHARGE DIAGNOSES:  1. Acute vertigo of questionable etiology.  No recurrence.  2. Intermittent mental status change compatible sundowning.  3. 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  4. History of atherosclerotic cardiovascular disease status post      stenting of his left anterior descending in 2006 following an      episode of successfully cardioverted ventricular tachycardia.  5. Gastroesophageal reflux disease.  6. Longstanding hypertension.  7. Mild anxiety disorder.   HISTORY OF PRESENT ILLNESS:  For details regarding admission, please  refer to the admitter.  Briefly, this is a 77 year old male with a  history as noted above who has had intermittent episodes of vertigo  possibly related to Lyrica after being put on Lyrica for apparent  neuropathic symptoms.  His dose had been recently decreased.   The day of admission, the patient took his medication and promptly fell  asleep.  Upon awakening, he went to mow the grass and while performing  this, he became overheated and very dizzy.  He was unable to stand and  was brought to the emergency room by his wife.  He had no chest pain,  shortness of breath, palpitations, headache, neurologic deficits other  than what is noted.   In the Emergency department, he underwent CT scanning of the brain which  was unremarkable except for old thalamic infarct with white matter  ischemic changes.  His blood pressure was essentially normal with no  significant orthostatic changes.  Hemogram and chemistries were  normal  except for a hematocrit of 0.8 which is baseline.  Cardiac markers  negative.  EKG nonacute.   The patient's symptoms did respond spontaneously.   The patient was admitted for further evaluation and therapy of above  noted symptom complex.   COURSE IN THE HOSPITAL:  The patient has had no significant recurrence  of his vertigo.  He underwent MRI scanning of his brain to rule out  recurrent infarct.  This was negative except for progressive increase in  small vessel disease type changes and old infarct in left thalamus.  He  also has some polypoid opacification of the inferior aspect of the right  maxillary sinus.   The patient has been getting confused in the evenings and been alert  during the day.  There has been no recurrent vertigo, syncope.  His rule  out MI protocol is negative and his rhythm has remained stable.   Currently, the patient is stable for discharge and will be followed  closely as an outpatient.  He may require neurologic consultation in the  future, though currently it appears that it would not be helpful.  DISCHARGE MEDICATIONS:  He is to continue his home medications which  include:  1. Zocor 40 mg daily.  2. Xanax 0.5 daily.  3. Docusate sodium 100 mg b.i.d.  4. Nitroglycerin p.r.n.  5. Zegerid 40 daily.  6. Ramipril 40/10 once daily.  7. Toprol succinate 100 mg once daily.  8. Plavix 75 mg daily.  9. Aspirin 81 daily.  He will discontinue Lyrica.   DISCHARGE FOLLOWUP:  He will be followed in 3 weeks as an outpatient.      [name], M.D.     MC/MEDQ  D:  05/08/2006  T:  05/08/2006  Job:  [id number]

## 2010-05-28 NOTE — Consult Note (Signed)
NAME:  [name], [name]              ACCOUNT NO.:  [id number]   MEDICAL RECORD NO.:  [id number]          PATIENT TYPE:  AMB   LOCATION:                                FACILITY:  [facility]   PHYSICIAN:  R. Roetta Sessions, M.D. DATE OF BIRTH:  31-Mar-1929   DATE OF CONSULTATION:  08/12/2004  DATE OF DISCHARGE:                                   CONSULTATION   REASON FOR CONSULTATION:  Indigestion, anorexia, nausea, abdominal pain.   HISTORY OF PRESENT ILLNESS:  [name] is a 77 year old African-American  male who reports a 52-month history of anorexia.  He also has had some  abdominal pain, although today he notes that he is not having much to  describe to me.  His wife, however, reports that he had about 7 months of  severe indigestion and nonspecific abdominal pain, along with anorexia and  nausea, without any emesis.  He had several weeks of diarrhea, as well.  He  lost a total of about 15 pounds.  He was found to have a kidney problem,  was seen by a urologist, and this is resolved per the patient's wife.  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  He was having daily nausea.  He had been taking Tums at  home with minimal relief.  He was started Zegerid 40 mg daily, and has had  some relief in his abdominal symptoms.  He denies any heartburn today.  Denies any regurgitation, dysphagia, or odynophagia.  His bowel movements  have been normal, soft, and brown, without any rectal bleeding or melena.  He does, however, complain of abdominal bloating, flatus, and belching.  Mr.  [name] has a history of CVA with right-sided deficits, and he is a very  difficult historian.   PAST MEDICAL HISTORY:  1.  Hypertension.  2.  Seasonal allergies.  3.  GERD.  4.  CVA with right-sided weakness.  5.  Renal insufficiency.  6.  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   CURRENT MEDICATIONS:  1.  Zegerid 40 mg daily.  2.  Allegra 180 mg daily.  3.   Metoprolol 25 mg daily.  4.  Plavix 75 mg daily.  5.  BuSpar 10 mg b.i.d.   ALLERGIES:  1.  No known drug allergies.  2.  He is unable to take NSAIDS.   FAMILY HISTORY:  Noncontributory.  Father deceased at age 22 secondary to  HEENT cancer.  Mother deceased at age 6 with history of CVA.  He has one  brother deceased secondary to myocardial infarction.  Two siblings with  CVAs, as well.   SOCIAL HISTORY:  Mr. Stilley has been married for 43 years.  He has one grown  healthy child.  He is currently retired.  He denies any tobacco, alcohol, or  drug use.   REVIEW OF SYSTEMS:  CONSTITUTIONAL:  Denies any fever or chills.  See HPI.  CARDIOVASCULAR:  Denies any chest pain or palpitations.  PULMONARY:  Denies  any shortness of breath, cough, or hemoptysis.  GI:  See HPI.  MUSCULOSKELETAL:  He does complain of significant stiffness and constant  right-sided pain.   PHYSICAL EXAMINATION:  VITAL SIGNS:  Weight 151 pounds, height 63 inches,  temperature 98.2, blood pressure 152/78, pulse 64.  GENERAL:  [name] is a 77 year old African-American male who is alert,  oriented, pleasant, cooperative, in no acute distress.  HEENT:  Sclerae are clear, nonicteric.  Conjunctivae pink.  Oropharynx pink  and moist without any lesions.  NECK:  Supple.  He does have a large fatty tumor at the base of his neck.  CHEST:  Heart regular rate and rhythm.  Normal S1 and S2.  Without murmurs,  rubs, or gallops.  LUNGS:  Clear to auscultation bilaterally.  ABDOMEN:  Positive bowel sounds x4.  No bruits auscultated.  Soft,  nontender, nondistended, without palpable mass or hepatosplenomegaly.  No  rebound tenderness or guarding.  RECTAL:  Deferred.  EXTREMITIES:  Without edema or clubbing bilaterally.   LABORATORY STUDIES:  He had a CT of the abdomen and pelvis with contrast on  March 23, 2004.  There are several simple cysts on the left kidney, and  aortoiliac calcification.  Otherwise, normal CT.    IMPRESSION:  [name] is a 77 year old African-American male with a 7-  month history of nausea, anorexia, 15 pound loss (some of which he has  regained), and what sounds like indigestion.  He has had a good response to  Zegerid, although it is very difficult to ascertain Mr. Soth symptoms.  I suspect he may have had indigestion or refractory GERD-type symptoms.  He  possibly could have even had peptic ulcer disease.  Of course, gallbladder  etiology should remain in the differential, as well.  Will proceed with  further evaluation of the upper GI tract, and, given his age, will do a  screening colonoscopy at the same time, as well.   PLAN:  1.  EGD and screening colonoscopy to be performed by Dr. Jena Gauss in the near      future.  I have discussed both procedures, including risks and benefits      which include, but are limited to, bleeding, infection, perforation,      drug reaction.  He agrees with the plan and consent will be obtained.      We will hold his Plavix 3 days prior to the procedure.  2.  Zegerid 40 mg daily.  Two weeks worth of samples.  A prescription for      #30 with 3 refills.  3.  Further recommendations pending procedures.   We would like to thank Dr. Regino Schultze for allowing [place] to participate in the  care of [name].       KC/MEDQ  D:  08/12/2004  T:  08/12/2004  Job:  [id number]   cc:   Kirk Ruths, M.D.  [address]  [place]  [place] [address]  Fax: [phone]

## 2010-05-28 NOTE — Discharge Summary (Signed)
[name], [name] NO.:  [id number]   MEDICAL RECORD NO.:  [id number]          PATIENT TYPE:  INP   LOCATION:  2033                         FACILITY:  [facility]   PHYSICIAN:  [name], M.D.   DATE OF BIRTH:  [birth year]   DATE OF ADMISSION:  09/22/2004  DATE OF DISCHARGE:  09/26/2004                                 DISCHARGE SUMMARY   DISCHARGE DIAGNOSES:  1.  Acute anterior myocardial infarction.      1.  Emergent cardiac catheterization with rescue percutaneous          transluminal coronary angioplasty and stent deployment to the left          anterior descending.  2.  Hypertension.  3.  Dyslipidemia.  4.  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  5.  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  6.  History of lipoma on his right shoulder, asymptomatic.  7.  Chronic renal insufficiency with aggravation of this by nonsteroidals.   DISCHARGE CONDITION:  Improved.   PROCEDURES:  1.  On September 22, 2004, emergent cardiac catheterization by Dr. Julieanne [name].  2.  September 22, 2004, percutaneous transluminal coronary angioplasty and      stent deployment to the left anterior descending using drug-eluting      stent, both CYPHER and TAXUS.  Please see catheterization report.   DISCHARGE MEDICATIONS:  1.  BuSpar 10 mg twice a day as before.  2.  Plavix 75 mg daily.  3.  Enteric-coated aspirin 81 mg daily.  4.  Zegerid 40 mg daily as before.  5.  Metoprolol 50 mg one-half tab twice a day.  6.  Zocor 40 mg once daily at bedtime.  7.  Altace 5 mg daily.  8.  Fexofenadine HCl 180 mg as needed as before.  9.  Nitroglycerin sublingual 1/150 under tongue q.5 min. p.r.n. chest pain.   DISCHARGE INSTRUCTIONS:  1.  Low-fat, low-salt diet.  2.  Follow cardiac rehab instructions for activity.  No driving until you      see Dr. Alanda [name].  3.  Wash right groin cath site with soap and water.   Call if any bleeding,      swelling or drainage.  4.  Follow up with Dr. Alanda [name] in [place]; our office will call you for      that date and time.  You should see him in the next 2 weeks.  5.  Cardiac Rehab should call you for an appointment date and time.   HISTORY OF PRESENT ILLNESS:  [age]-year-old married African American  male presented to [facility] ER with acute chest pain; it had been going on  since Sunday.   Initially, it occurred with exertion, would rest and would get better, but  then it started coming at rest and the night before admission, the pain  increased; he was short of breath and diaphoretic, no nausea and vomiting,  but he would not come to the hospital with both wife and daughter asking him  to.  He did present to [facility], September 22, 2004, with chest pain.  He  had positive enzymes, ST elevation in anterolateral leads.  He was sent to  [facility] with [facility].  En route, he had a V-tach arrest; he was cardioverted  into a sinus bradycardia and then in sinus rhythm.  The patient was on IV  nitroglycerin, IV heparin and he had gotten IV morphine in the ER, also  received his aspirin at [facility].  On arrival to the cath lab, he was  without symptoms of chest pain.  He was alert and oriented x3, answered  questions appropriately.   PAST MEDICAL HISTORY:  1.  The patient denies any prior cardiac history.  2.  Hypertension.  3.  History of left thalamic CVA with right-sided weakness he had in      February 2005.  4.  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  5.  He also has a lipoma of his right shoulder.  6.  He, at times in the past, has been on nonsteroidals with increased renal      insufficiency; he has chronic renal insufficiency and at the time of      admission, had been taken off his Avapro.  7.  His cholesterol had been stable.   ALLERGIES:  NONSTEROIDALS have caused renal  insufficiency.  The patient gets  nonsteroidals confused with aspirin.  He does not, per his wife, have an  allergy to aspirin.   OUTPATIENT MEDICATIONS:  1.  Metoprolol 50 mg daily.  2.  Allegra 180 mg daily.  3.  BuSpar 10 mg b.i.d.  4.  Zegerid 40 mg daily.   FAMILY HISTORY:  See H&P.   SOCIAL HISTORY:  See H&P.   REVIEW OF SYSTEMS:  See H&P.   PHYSICAL EXAMINATION AT DISCHARGE:  VITAL SIGNS:  Blood pressure 129/74,  pulse 76, respirations 20, temperature 98.3.  Oxygen saturation on room air  98%.  HEART:  S1 and S2, regular rate and rhythm.  LUNGS:  Clear.  ABDOMEN:  Abdomen soft and nontender.  Positive bowel sounds.  EXTREMITIES:  Extremities without edema.   LABORATORY DATA:  Hemoglobin on admission 9.8, hematocrit 29.1, WBC 10.8,  platelets 164,000.  Followup hemoglobin 10.1, hematocrit 29.8.   Chemistries:  Sodium 137, potassium 3.9, chloride 106, CO2 25, glucose 91,  BUN 11, creatinine 1.2, calcium 8.5, magnesium 1.8 and these remained  stable.  Glycosylated hemoglobin was 5.1.   Cardiac enzymes:  CK 518, MB 29.6 and troponin 16.42.  On followup prior to  discharge, troponin was down 5.16.  Lipids:  Total cholesterol 145,  triglycerides 63, HDL 33 and LDL 99; he was started on Zocor.   TSH 0.930.   Urinalysis was clear.   IMAGING STUDIES:  Chest x-ray:  No acute abnormalities.   Two-dimensional echo was done on September 23, 2004.  Overall LV systolic  function was normal, EF 55% to 65% with akinesis of the periapical wall,  mild hypokinesis of the mid-distal anterolateral wall, moderate focal basal  septal hypertrophy.  There was no systolic anterior motion of the mitral  valve, consider papillary muscle dysfunction.  There was mild mitral valve  regurgitation.  Mitral regurgitation jet was eccentric and directed  posteriorly.   ELECTROCARDIOGRAMS:  Initial EKG at [facility] revealed sinus rhythm with ST elevations in V2 and 3 and V6.  On arrival at [facility],  ST elevations, V2, V3,  V6, and depression in V4 and 5.   Followup EKGs revealed evolving MI, anterolateral.   HOSPITAL COURSE:  Mr. [name] was admitted by Dr. Allyson Sabal and Dr. Clarene Duke after  presenting to [facility] with an acute anterior MI.  He was brought  to [facility] by [facility] and transported directly to the cath lab, where he  underwent cardiac cath by Dr. Caprice Kluver and was found to have significant  LAD stenosis.  He underwent PTCA and drug-eluting stents to the LAD.  He has  continual disease, both in the RCA and the circumflex.   The patient tolerated the procedure well.  En route, he did have a V-tach  arrest in [facility]; 1 cardioversion resulted in sinus rhythm/sinus  bradycardia.  The patient was admitted to the CCU, continued to do well.   The patient was ambulated with Cardiac Rehab without problems.  By the 16th,  he was essentially stable and then transferred to telemetry, but his blood  pressure remained somewhat elevated and his ACE was titrated up.  By  September 26, 2004, Dr. Tresa Endo saw him and felt he was ready for discharge.  He will need his renal function followed as an outpatient secondary to renal  insufficiency and now back on ACE.  He will follow up with Dr. Alanda [name] in  [place].      Darcella Gasman. Annie Paras, N.P.      [name], M.D.  Electronically Signed    LRI/MEDQ  D:  09/26/2004  T:  09/27/2004  Job:  [id number]   cc:   Thereasa Solo. Little, M.D.  Fax: [phone]   Richard A. [name], M.D.  Fax: [phone]   Kirk Ruths, M.D.  Fax: [phone]

## 2010-05-28 NOTE — Procedures (Signed)
   NAME:  [name], [name]                        ACCOUNT NO.:  [id number]   MEDICAL RECORD NO.:  [id number]                   PATIENT TYPE:  INP   LOCATION:  A211                                 FACILITY:  [facility]   PHYSICIAN:  Madaline Savage, M.D.             DATE OF BIRTH:  09/03/1929   DATE OF PROCEDURE:  08/26/2002  DATE OF DISCHARGE:                                  ECHOCARDIOGRAM   INDICATIONS FOR PROCEDURE:  Transient ischemic attack.   TECHNICAL QUALITY:  Technically, this study is adequate for interpretation.   RESULTS:  1. Aortic valve. The aortic valve is basically tri-leaflet. There is mild to     moderate calcification on 1 of the commissures between 2 valve leaflets     that is not stenotic, only sclerotic.  2. Mitral valve.  Mild mitral annular calcification. Normal mitral valve     movement. No stenosis. No significant regurgitation.  3. Tricuspid valve. Structurally normal. No regurgitation.  4. Pulmonic valve. Incompletely seen. Probably normal.  5. Left atrium. Left atrial size normal at 3.9. No evidence of atrial myxoma     or atrial thrombus.  6. Aorta. Normal aortic root dimension of 3.1.  7. Right ventricle. Normal RV end-systolic dimension. There is a prominent     papillary muscle seen in the RV, which is a variant of normal.  8. Left ventricle. The left ventricular end-systolic dimension is 2.8. The     end-diastolic dimension is 3.8. There is mild concentric left ventricular     hypertrophy with septum and posterior wall, both measuring 1.2 to 1.3. No     LV thrombus noted.  9. Pericardium. No effusion seen.   FINAL DIAGNOSES:  1. No embolic source noted.  2. Mild left ventricular hypertrophy concentric with normal left ventricular     systolic function.  3. Unremarkable valvular function. See specific description above.                                               Madaline Savage, M.D.    WHG/MEDQ  D:  08/27/2002  T:  08/27/2002  Job:   [id number]   cc:   Corrie Mckusick, M.D.  [address] [place]urell Josephs. [place]  [place]  [place] [address]  Fax: [phone]

## 2010-05-28 NOTE — Op Note (Signed)
NAME:  [name], [name]              ACCOUNT NO.:  [id number]   MEDICAL RECORD NO.:  [id number]          PATIENT TYPE:  AMB   LOCATION:  DAY                           FACILITY:  [facility]   PHYSICIAN:  R. Roetta Sessions, M.D. DATE OF BIRTH:  April 11, 1929   DATE OF PROCEDURE:  08/24/2004  DATE OF DISCHARGE:                                 OPERATIVE REPORT   PROCEDURES:  Esophagogastroduodenoscopy, diagnostic, followed by colonoscopy  with biopsy.   INDICATION FOR PROCEDURE:  A 77 year old gentleman with a seven-month  history of anorexia, nausea, 15-pound weight loss, and symptoms consistent  with dyspepsia/reflux.  EGD and colonoscopy are now being done.  Colonoscopy  is done predominantly as a screening maneuver.  This approach has been  discussed with the patient at length, the potential risks, benefits, and  alternatives have been reviewed, questions answered.  Please see  documentation in the medical record.   PROCEDURE NOTE:  O2 saturation, blood pressure, pulse, and respiration were  monitored throughout the entirety of the procedure.   CONSCIOUS SEDATION:  Versed 3 mg IV, Demerol 75 mg IV in divided doses.   INSTRUMENT USED:  Olympus video chip system.   FINDINGS:  EGD:  Examination of the tubular esophagus revealed an  incomplete, noncritical Schatzki's ring.  Otherwise the esophageal mucosa  appeared normal.  EG junction easily traversed.   Stomach:  The gastric cavity was empty and insufflated well with air.  A  thorough examination of the gastric mucosa including a retroflexed view of  the proximal stomach and esophagogastric junction demonstrated only a small  hiatal hernia.  Pylorus patent and easily traversed.  Examination of the  bulb and second portion revealed multiple bulbar erosions.  There was no  ulcer.   Therapeutic/diagnostic maneuvers performed:  None.   The patient tolerated the procedure well, was prepared for colonoscopy.  Digital rectal exam revealed no  abnormalities.  Endoscopic findings:  Prep  was excellent.   Rectum:  Examination of the rectal mucosa including retroflexed view of the  anal verge revealed no abnormalities.   Colon:  The colonic mucosa was surveyed from the rectosigmoid junction  through the left, transverse and right colon to the area of the appendiceal  orifice, ileocecal valve and cecum.  There was a straight shot to the cecum.  The colonoscope was very easily advanced to the cecum.  These structures  were well-seen and photographed for the record.  From this level the scope  was slowly and cautiously withdrawn.  All previously-mentioned mucosal  surfaces were again seen.  The terminal ileum, too, was intubated to 10 cm.  The terminal ileum mucosa appeared normal.  The colonic mucosa appeared  entirely normal except for a 4 mm pedunculated polyp at the hepatic flexure,  which was removed totally with cold biopsy forceps technique.  The patient  tolerated both procedures well, was reacted in endoscopy.   IMPRESSION:  1.  Normal esophagus aside from an incomplete, noncritical Schatzki's ring.  2.  Small hiatal hernia, otherwise normal stomach.  3.  Bulbar erosions, otherwise normal D1, D2.   Colonoscopy findings:  1.  Normal rectum.  2.  Diminutive polyp of hepatic flexure, removed with cold biopsy forceps      technique.  The remainder of the colonic mucosa appeared normal.  3.  Normal terminal ileum.   RECOMMENDATIONS:  1.  Today's findings do not explain patient's recent symptoms.  Will obtain      H. pylori serologies, obtain a hepatic profile, CBC, amylase, and pursue      a gallbladder ultrasound in the very near future.  2.  Follow up on pathology.  3.  Further recommendations to follow.      [name], M.D.  Electronically Signed     RMR/MEDQ  D:  08/24/2004  T:  08/24/2004  Job:  [id number]   cc:   Kirk Ruths, M.D.  [address]  [place]  [place] [address]  Fax: [phone]

## 2010-05-28 NOTE — Consult Note (Signed)
NAME:  [name], [name]                        ACCOUNT NO.:  [id number]   MEDICAL RECORD NO.:  [id number]                   PATIENT TYPE:  INP   LOCATION:  A211                                 FACILITY:  [facility]   PHYSICIAN:  Kofi A. Gerilyn Pilgrim, M.D.              DATE OF BIRTH:  02-Apr-1929   DATE OF CONSULTATION:  DATE OF DISCHARGE:                                   CONSULTATION   IMPRESSION:  Acute cerebrovascular event with right hemiataxia likely  representing a right cerebellar and/or right brain stem stroke.   RECOMMENDATIONS:  I think aspirin and Plavix are sufficient.  I would  therefore discontinue the Lovenox as this increases the risk of bleeding  without any significant benefit.  We are also going to go ahead and add a  magnetic resonance angiography to evaluate for intracranial occlusive  disease.  Lipid-lowering agents, in particular statins, reduce the risk of  recurrent stroke and we will therefore add this regardless of the lipid  profile.  Will go ahead and also do a lipid profile.  Homocystine levels  will be ordered.  He has a carotid and an echocardiogram ordered.  Will  follow the results of this.   HISTORY:  This is a 77 year old, left-handed, African American gentleman who  has a history of hypertension.  He reports developing onset of left foot  numbness about two days ago.  He saw his primary care physician and was  started on aspirin and Plavix combination, but decided to come to the  emergency room when the symptoms progressed and got worse.  It not only  involves the right lower extremity, but also involves the right upper  extremity.  No headaches are reported.  There is no dysphagia, no  dysarthria, and no diplopia.  He does report having some sensory symptoms in  the right lower facial area.  No history of recurrent strokes.   PAST MEDICAL HISTORY:  Relatively unremarkable.  There is a history of  hypertension.  No other medical problems are  reported.   SOCIAL HISTORY:  He is married.  He does not smoke or use tobacco.   REVIEW OF SYSTEMS:  As stated in the history of present illness.   CURRENT MEDICATIONS:  1. Lovenox 1 mg/kg every 12 hours.  2. Plavix 75 mg once a day.  3. Aspirin 325 mg daily.   PHYSICAL EXAMINATION:  GENERAL APPEARANCE:  A pleasant gentleman in no acute  distress.  NECK:  Supple.  VITAL SIGNS:  He is afebrile.  The blood pressure is 145/67.  Saturation 98%  on room air.  NEUROLOGIC:  He is awake and alert.  He converse fluently and coherently  with no dysarthria or language problems.  The cranial nerve evaluation shows  that visual fields are intact.  Extraocular movements are full.  Facial  muscle strength is normal.  The tongue is midline.  The uvula is midline.  He has slightly reduced sensation in the right lower facial region to light  touch, but does not appear to be impaired to temperature.  Shoulder shrugs  are normal.  The motor examination shows normal tone, bulk, and strength.  There is no pronator drift.  Coordination indicates marked dysmetria of the  right upper extremity and mild to marked dysmetria of the right lower  extremity.  Reflexes +2.  Plantar reflexes downgoing.  The sensory  examination is normal to temperature, however, impaired to light touch in  the right upper extremity, right lower extremity, and right facial region.  Gait is slightly ataxic.   CT scan of the brain shows severe chronic ischemic white matter changes and  no acute intracranial pathology noted.   Thanks for this consultation.                                               Kofi A. Gerilyn Pilgrim, M.D.    KAD/MEDQ  D:  08/25/2002  T:  08/25/2002  Job:  [id number]

## 2010-05-28 NOTE — H&P (Signed)
NAMEEMMITTE, SURGEON NO.:  [id number]   MEDICAL RECORD NO.:  [id number]          PATIENT TYPE:  OBV   LOCATION:  A214                          FACILITY:  [facility]   PHYSICIAN:  [name], M.D.    DATE OF BIRTH:  [birth year]   DATE OF ADMISSION:  05/05/2006  DATE OF DISCHARGE:  LH                              HISTORY & PHYSICAL   CHIEF COMPLAINT:  Vertigo.   HISTORY OF PRESENT ILLNESS:  This is a very pleasant 77 year old African  American male with a history of myocardial infarction in 2006.  En route  from [facility] to [facility], the patient experienced an episode of  ventricular tachycardia and was successfully resuscitated.  An emergent  cardiac catheterization with rescue percutaneous angioplasty and stent  deployment to the left anterior descending was undertaken successfully.  He also has hypertension, dyslipidemia and chronic renal insufficiency.  There is a history of a left thalamic cerebrovascular accident with  right-sided weakness in 2006 as well.   The patient has been doing quite well and remaining active.  He was put  on Lyrica for apparent neuropathic symptoms.  He stated this made him  dizzyas well as hypersomnolent.  The dose was recently decreased to  37.5 mg.   The patient took his medication and promptly fell asleep.  Upon  awakening he went to mow the grass.  While performing this,  the patient  became overheated and very dizzy.  He was unable to stand and was  brought to the emergency department by his wife.   There is no history of chest pain, shortness of breath, palpitations,  headache, neurologic deficits other than what is noted above.   In the emergency department the patient underwent CT scanning of the  brain, which was essentially unremarkable except for the remote left  thalamic infarct and white matter ischemic changes.  His blood pressure  was essentially normal without significant orthostatic changes.  Hemogram and  chemistries normal except for a creatinine of 1.8, which is  near his baseline.  Cardiac markers were also negative, and EKG was not  acute.   The patient's symptoms resolved spontaneously.  Of note, the patient  states that he thinks that the Lyrica has caused his symptoms  recurrently.   The patient is admitted with acute vertigo, which currently is resolved.  Consider recurrent CVA, though medication effect seems most likely.   There is no history of nausea, vomiting, abdominal pain, melena,  hematochezia or hematemesis.  It is noted that he denies any chest pain,  palpitations, shortness of breath.   CURRENT MEDICATIONS:  1. Zocor 40 mg daily.  2. Lyrica 75 mg (one-half tablet) t.i.d.  3. Xanax 0.5 mg b.i.d. p.r.n.  4. Docusate sodium 100 mg b.i.d.  5. Nitroglycerin p.r.n. (not used)I.  6. Zegerid 40 mg daily.  7. Ramipril 10 mg daily.  8. Metoprolol tartrate 100 mg daily.  9. Plavix 75 mg daily.  10.Aspirin 81 mg daily.   ALLERGIES:  None, except nonsteroidals have aggravated his renal  insufficiency.   PAST HISTORY:  As noted above.  He also has gastroesophageal reflux  disease and longstanding hypertension.  No surgeries in the past.   REVIEW OF SYSTEMS:  Negative except as mentioned.   FAMILY HISTORY:  Noncontributory.   SOCIAL HISTORY:  Nonsmoker, nondrinker.   PHYSICAL EXAMINATION:  GENERAL:  A very pleasant, fully alert male in no  acute distress.  VITAL SIGNS:  Currently BP is 120/80.  He is afebrile, heart rate 60 and  regular.  There has been no arrhythmia noted on the monitor overnight.  Respirations 20 and unlabored.  HEENT: Normocephalic, atraumatic.  The pupils are equal.  Ears, nose,  throat are benign.  NECK:  Supple.  No audible bruits or masses except for a large  thyroglossal duct cyst, which is chronic.  LUNGS:  Clear to AP.  CARDIAC:  Heart sounds are somewhat distant but no murmurs, rubs or  gallops were audible at this time.  ABDOMEN:   Nontender, nondistended.  Bowel sounds intact.  EXTREMITIES:  No clubbing, cyanosis or edema.  NEUROLOGIC:  Some weakness and hyporeflexivity of the right upper and  lower extremities, which is chronic.  He does have some past-pointing,  but no other cerebellar signs noted on the right.   ASSESSMENT:  Acute vertigo, which has been recurrent since beginning  Lyrica, and this seems the most likely culprit.  He is at risk for  recurrent cerebrovascular accident.  There is no evidence of acute  coronary syndrome or arrhythmia contributing to his symptoms.   PLAN:  Monitor closely.  MRI of the brain to rule out CVA, though this  seems unlikely.  Will continue his current medications except for  discontinuation of the Lyrica.  Will follow and treat expectantly.      [name], M.D.  Electronically Signed     MC/MEDQ  D:  05/06/2006  T:  05/06/2006  Job:  [id number]

## 2010-05-28 NOTE — Cardiovascular Report (Signed)
[name], [name]:  [id number]   MEDICAL RECORD NO.:  [id number]          PATIENT TYPE:  INP   LOCATION:  2931                         FACILITY:  [facility]   PHYSICIAN:  Thereasa Solo. Little, M.D. DATE OF BIRTH:  10-06-1929   DATE OF PROCEDURE:  09/22/2004  DATE OF DISCHARGE:                              CARDIAC CATHETERIZATION   INDICATIONS:  This 77 year old male that began having chest pain three days  prior to this. It would wax and wane but the night prior to this  catheterization had more severe pain all night long and finally presented to  [facility] Emergency Room where he had positive cardiac enzymes and ST-T  changes in the anterior leads consistent with a non-Q-wave anterior  myocardial infarction. His cardiac enzymes were positive. En route to [facility] he had a ventricular fibrillation event and had to  be defibrillated. Upon arrival in the cath lab, he is now painfree. Vital  signs are stable.   The patient is in a sinus rhythm.   After discussing the cardiac catheterization with the patient, he was  prepped and draped in the usual sterile fashion exposing the right groin.  Following local anesthetic with 1% Xylocaine, the Seldinger technique was  employed and the 6-French introducer sheath was placed into the right  femoral vein and a 6-French introducer sheath in the right femoral artery.   Left and right coronary arteriography, ventriculography:  A descending  aortic root injection was performed and then a complex percutaneous  intervention to his entire mid and proximal LAD was performed.   COMPLICATIONS:  None.   A total contrast 330 mL of Omnipaque.   RESULTS:  1.  Hemodynamic monitoring:  Central aortic pressure was 140/74. Left      ventricular pressure was 142/13 with no significant aortic valve      gradient at the time of pullback.  2.  Ventriculography:  Ventriculography in the RAO projection revealed the      anterior and posterior basilar segments to be normal to hyperdynamic.      The mid anterior and the distal posterior wall of the left ventricle was      severely hypokinetic and the apex was akinetic. No obvious thrombus was      seen. The ejection fraction was around 35% and the left ventricular end-      diastolic pressure was 31.  3.  Distal aorta: A distal aortogram was performed. I was concerned the      patient may need an intra-aortic balloon pump. No evidence of a      descending or an abdominal aneurysm was noted. His iliacs only had mild      irregularities.   CORONARY ARTERIOGRAPHY:  On fluoroscopy calcification was seen in the  distribution of the mid and distal LAD.  1.  Left main normal and bifurcated.  2.  Circumflex: The circumflex gave rise to a very large proximal OM vessel      which had only mild irregularities. OM2 was free of disease but right  after OM2 was an area of 50% focal narrowing that proceeded on the OM3.  3.  LAD:  The LAD was a large vessel that crossed the apex of the heart. In      the proximal segment was a long area of moderate to severe      irregularities ranging from 40-60% was some aneurysmal dilatation. This      extended all the way down into the midportion of the vessel. There was      an ulcerated plaque just after the first diagonal that was probably      culprit event for this infarct. There was flow through there at about      TIMI I. Distal to this, after another 15 mm segment, was a 90% focal      area of narrowing with a distal LAD having only mild irregularities. The      ostium of the second diagonal had an area of 60% narrowing. This was a      small vessel and it came off in the middle of the high-grade stenosis of      the ulcerated plaque.  4.  Right coronary artery:  The right coronary had minimal mid      irregularities. The PDA was free of significant disease. The posterior      lateral branch with a long vessel of  about 2 mm max in diameter. There      was a 20 mm long area of 70-80% narrowing.   After trying to obtain surgical opinion regarding bypass surgery versus  percutaneous intervention, I finally proceeded on with intervention. Surgery  was not available to discuss this with me.   A 6-French JL-4 guide catheter was used and a short Luge was used. The  patient's ACT at the beginning of the procedure was 220. He was given an  additional 1000 units of heparin and the ACT at the termination of the case  was 242. He was also given double bolus Integrilin and maintained on an  Integrilin drip which will be continued for 18 additional hours.   A short Luge wire was placed on the LAD. There was considerable difficulty  getting it pass the ulcerated plaque. Once the short Luge wire was in the  distal portion of the vessel, a 2.0 x 12 Voyager balloon was made ready. The  ulcerated plaque area had a single inflation of 11 atmospheres of 55  seconds, the distal 90% focal area had two inflations 11 x 60 and 12 x 60.  There were still high-grade stenoses after the angioplasties but there was  TIMI II flow and the vessel appeared to be large enough to accommodate  stenting.   This was a long diffuse area that extended all the way back past the first  septal perforator and in reality represented around 50-55 mm in length. The  most distal segment was stented with a 2.5 x 28 Cypher stent. It was  deployed at 13 x 60 with a final inflation 14 x 60. The second area was 2.75  x 32 Taxus stent was used (we had no 2.75 Cypher stents). With appropriate  overlap, the stent was extended back all way past the first septal  perforators so that the entire proximal and mid segments were well covered  with stents. The initial inflation was 13 atmospheres for 60 seconds with a  final inflation being 12 atmospheres for 60 seconds.   Once the stents were placed, there was  brisk TIMI III flow.   Post dilatation was  performed with a 3.0 x 8 Maverick balloon. The  overlapped area was treated with 12 atmospheres for 45 seconds and the most  proximal segment of the Taxus stent had a single inflation of 12 atmospheres  for 50 seconds in an attempt to flare at the more proximal segment.   Once stenting was accomplished, the vessel appeared to be basically normal.  There was brisk TIMI III flow without any evidence of dissection or  thrombus. The area of narrowing that involved the ostium of the small  diagonal branch was still unchanged about 60-70%. Attempts to enter this was  not undertaken.   The patient tolerated the procedure well and was painfree, alert and  oriented when he left the cath lab. The sheaths were sewn in place. They  will be removed later today. He is already on Plavix and I will continue  this.   His apical abnormality is worrisome to me. I do not think that Coumadin  would be an additional add-on drug since he will have to be on aspirin and  Plavix for the stents and I did not see anything that represented a left  ventricular thrombus at the time of his cath. Follow up with an  echocardiogram will be of benefit.           ______________________________  Thereasa Solo. Little, M.D.     ABL/MEDQ  D:  09/22/2004  T:  09/22/2004  Job:  [id number]   cc:   Catheter Lab   Kirk Ruths, M.D.  [id number]  [place]  [place] [address]  Fax: [phone]   Dani Gobble, MD  Fax: [phone]

## 2010-05-28 NOTE — Discharge Summary (Signed)
NAME:  [name], [name]                        ACCOUNT NO.:  [id number]   MEDICAL RECORD NO.:  [id number]                   PATIENT TYPE:  INP   LOCATION:  A211                                 FACILITY:  [facility]   PHYSICIAN:  Corrie Mckusick, M.D.               DATE OF BIRTH:  Jul 18, 1929   DATE OF ADMISSION:  08/25/2002  DATE OF DISCHARGE:  08/27/2002                                 DISCHARGE SUMMARY   DISCHARGE DIAGNOSIS:  Left thalamic cerebrovascular accident.   HISTORY OF PRESENTING ILLNESS, PAST MEDICAL HISTORY:  Please see admission  H&P.   HOSPITAL COURSE:  This is a 77 year old gentleman with hypertension who  presented with right upper and lower extremity numbness.  He was admitted  for proper workup and was started on low molecular weight heparin.  Neurology was also consulted.  CPK-MB and troponins were drawn q.8h. x3,  which were all negative.  The troponins all were 0.05.  The initial CT on  admission showed no acute changes.  The MRI subsequently showed an acute  left thalamic stroke.  It also showed some diffuse chronic small-vessel  disease in the periventricular white matter.  There was no apparent large-  vessel occlusion.  The carotid Doppler showed scattered plaque and intimal  thickening.  No definitive hemodynamically significant stenosis.  There was  slight increased velocity in the left common carotid artery.  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  Echocardiogram done and reported, not on the chart but  will be obtained prior to discharge.  The patient has done remarkably well  in the hospital.  The numbness on the right side has improved during  hospital stay.  Also, his gait has near normalized.  Will set up for  physical therapy as an outpatient.   DISCHARGE PHYSICAL:  VITAL SIGNS:  T-max 99, blood pressure is 129-140/79-  85, heart rate in the 60s-70s, respiratory rate 20s.  GENERAL:   Pleasant, talkative gentleman in no acute distress.  CHEST:  Clear to auscultation bilaterally.  CARDIOVASCULAR:  Regular rate and rhythm.  With no murmurs.  ABDOMEN:  Soft, nontender.  NEUROLOGIC:  Intact.  No focal deficits appreciated.   DISCHARGE MEDICATIONS:  1. Coated aspirin 81 mg daily.  2. Plavix 75 mg daily.  3. Norvasc 5 mg daily.  4. Vitorin 10/20 p.o. q.p.m. daily.   DISCHARGE CONDITION:  Improved and stable.    FOLLOW-UP:  Discharge follow-up ith Dr. Gerilyn Pilgrim in one to two weeks.  Discharge follow-up with [facility] in one week.  Discharge physical  therapy as an outpatient.                                               Ryan Ruiz  Dionisio [name], M.D.    [name]  D:  08/27/2002  T:  08/27/2002  Job:  [id number]

## 2010-09-03 ENCOUNTER — Other Ambulatory Visit (HOSPITAL_COMMUNITY): Payer: Self-pay | Admitting: Internal Medicine

## 2010-09-03 ENCOUNTER — Ambulatory Visit (HOSPITAL_COMMUNITY)
Admission: RE | Admit: 2010-09-03 | Discharge: 2010-09-03 | Disposition: A | Discharge status: Home / Self Care | Payer: Medicare Other | Source: Ambulatory Visit | Attending: Internal Medicine | Admitting: Internal Medicine

## 2010-09-03 DIAGNOSIS — R1084 Generalized abdominal pain: Secondary | ICD-10-CM

## 2010-09-03 DIAGNOSIS — R109 Unspecified abdominal pain: Secondary | ICD-10-CM | POA: Insufficient documentation

## 2010-09-03 DIAGNOSIS — N200 Calculus of kidney: Secondary | ICD-10-CM | POA: Insufficient documentation

## 2010-09-03 MED ORDER — IOHEXOL 300 MG/ML  SOLN
100.0000 mL | Freq: Once | INTRAMUSCULAR | Status: AC | PRN
  Administered 2010-09-03: 100 mL via INTRAVENOUS

## 2010-09-04 ENCOUNTER — Emergency Department (HOSPITAL_COMMUNITY): Payer: Medicare Other

## 2010-09-04 ENCOUNTER — Encounter: Payer: Self-pay | Admitting: *Deleted

## 2010-09-04 ENCOUNTER — Other Ambulatory Visit: Payer: Self-pay

## 2010-09-04 ENCOUNTER — Inpatient Hospital Stay (HOSPITAL_COMMUNITY)
Admission: EM | Admit: 2010-09-04 | Discharge: 2010-09-06 | DRG: 872 | Disposition: A | Discharge status: Home / Self Care | Payer: Medicare Other | Attending: Internal Medicine | Admitting: Internal Medicine

## 2010-09-04 DIAGNOSIS — R509 Fever, unspecified: Secondary | ICD-10-CM

## 2010-09-04 DIAGNOSIS — I69959 Hemiplegia and hemiparesis following unspecified cerebrovascular disease affecting unspecified side: Secondary | ICD-10-CM

## 2010-09-04 DIAGNOSIS — B9689 Other specified bacterial agents as the cause of diseases classified elsewhere: Secondary | ICD-10-CM | POA: Diagnosis present

## 2010-09-04 DIAGNOSIS — I10 Essential (primary) hypertension: Secondary | ICD-10-CM | POA: Diagnosis present

## 2010-09-04 DIAGNOSIS — R1084 Generalized abdominal pain: Secondary | ICD-10-CM | POA: Diagnosis present

## 2010-09-04 DIAGNOSIS — I69922 Dysarthria following unspecified cerebrovascular disease: Secondary | ICD-10-CM

## 2010-09-04 DIAGNOSIS — F05 Delirium due to known physiological condition: Secondary | ICD-10-CM | POA: Diagnosis present

## 2010-09-04 DIAGNOSIS — I69328 Other speech and language deficits following cerebral infarction: Secondary | ICD-10-CM | POA: Insufficient documentation

## 2010-09-04 DIAGNOSIS — R7881 Bacteremia: Principal | ICD-10-CM | POA: Diagnosis present

## 2010-09-04 DIAGNOSIS — I2510 Atherosclerotic heart disease of native coronary artery without angina pectoris: Secondary | ICD-10-CM | POA: Diagnosis present

## 2010-09-04 HISTORY — DX: Cerebral infarction, unspecified: I63.9

## 2010-09-04 HISTORY — DX: Atherosclerotic heart disease of native coronary artery without angina pectoris: I25.10

## 2010-09-04 HISTORY — DX: Essential (primary) hypertension: I10

## 2010-09-04 LAB — DIFFERENTIAL
Basophils Absolute: 0 10*3/uL (ref 0.0–0.1)
Lymphocytes Relative: 5 % — ABNORMAL LOW (ref 12–46)
Lymphs Abs: 0.6 10*3/uL — ABNORMAL LOW (ref 0.7–4.0)
Monocytes Absolute: 2 10*3/uL — ABNORMAL HIGH (ref 0.1–1.0)
Neutro Abs: 9.8 10*3/uL — ABNORMAL HIGH (ref 1.7–7.7)

## 2010-09-04 LAB — CBC
HCT: 34.5 % — ABNORMAL LOW (ref 39.0–52.0)
Hemoglobin: 11.6 g/dL — ABNORMAL LOW (ref 13.0–17.0)
RBC: 3.63 MIL/uL — ABNORMAL LOW (ref 4.22–5.81)
RDW: 12.1 % (ref 11.5–15.5)
WBC: 12.4 10*3/uL — ABNORMAL HIGH (ref 4.0–10.5)

## 2010-09-04 LAB — BASIC METABOLIC PANEL
Chloride: 96 mEq/L (ref 96–112)
GFR calc Af Amer: >60 mL/min (ref >60)
Potassium: 3.4 mEq/L — ABNORMAL LOW (ref 3.5–5.1)
Sodium: 134 mEq/L — ABNORMAL LOW (ref 135–145)

## 2010-09-04 LAB — URINE MICROSCOPIC-ADD ON

## 2010-09-04 LAB — URINALYSIS, ROUTINE W REFLEX MICROSCOPIC
Bilirubin Urine: NEGATIVE
Leukocytes, UA: NEGATIVE
Nitrite: NEGATIVE
Specific Gravity, Urine: 1.015 (ref 1.005–1.030)
pH: 6.5 (ref 5.0–8.0)

## 2010-09-04 MED ORDER — RAMIPRIL 10 MG PO CAPS
10.0000 mg | ORAL_CAPSULE | Freq: Every day | ORAL | Status: DC
  Administered 2010-09-04 – 2010-09-06 (×3): 10 mg via ORAL
  Filled 2010-09-04 (×3): qty 1

## 2010-09-04 MED ORDER — BISACODYL 10 MG RE SUPP: 10.0000 mg | RECTAL | Status: DC | PRN

## 2010-09-04 MED ORDER — POLYETHYLENE GLYCOL 3350 17 G PO PACK: 17.0000 g | PACK | Freq: Every day | ORAL | Status: DC | PRN

## 2010-09-04 MED ORDER — SIMVASTATIN 20 MG PO TABS
40.0000 mg | ORAL_TABLET | Freq: Every day | ORAL | Status: DC
  Administered 2010-09-04 – 2010-09-05 (×2): 40 mg via ORAL
  Filled 2010-09-04 (×2): qty 2

## 2010-09-04 MED ORDER — ONDANSETRON HCL 4 MG/2ML IJ SOLN: 4.0000 mg | Freq: Four times a day (QID) | INTRAMUSCULAR | Status: DC | PRN

## 2010-09-04 MED ORDER — SODIUM CHLORIDE 0.9 % IV SOLN
INTRAVENOUS | Status: DC
  Administered 2010-09-04 – 2010-09-05 (×2): via INTRAVENOUS
  Filled 2010-09-04 (×6): qty 1000

## 2010-09-04 MED ORDER — ENOXAPARIN SODIUM 40 MG/0.4ML ~~LOC~~ SOLN
40.0000 mg | Freq: Every day | SUBCUTANEOUS | Status: DC
  Administered 2010-09-04 – 2010-09-05 (×2): 40 mg via SUBCUTANEOUS
  Filled 2010-09-04 (×3): qty 0.4

## 2010-09-04 MED ORDER — ACETAMINOPHEN 650 MG RE SUPP: 650.0000 mg | Freq: Four times a day (QID) | RECTAL | Status: DC | PRN

## 2010-09-04 MED ORDER — ALPRAZOLAM 1 MG PO TABS
1.0000 mg | ORAL_TABLET | Freq: Every evening | ORAL | Status: DC | PRN
  Administered 2010-09-05: 1 mg via ORAL
  Filled 2010-09-04: qty 1

## 2010-09-04 MED ORDER — ASPIRIN EC 81 MG PO TBEC
81.0000 mg | DELAYED_RELEASE_TABLET | Freq: Every day | ORAL | Status: DC
  Administered 2010-09-04 – 2010-09-06 (×3): 81 mg via ORAL
  Filled 2010-09-04 (×3): qty 1

## 2010-09-04 MED ORDER — DEXTROSE 5 % IV SOLN
INTRAVENOUS | Status: AC
  Filled 2010-09-04: qty 1

## 2010-09-04 MED ORDER — LEVOFLOXACIN IN D5W 500 MG/100ML IV SOLN
500.0000 mg | INTRAVENOUS | Status: DC
  Administered 2010-09-04 – 2010-09-06 (×3): 500 mg via INTRAVENOUS
  Filled 2010-09-04 (×4): qty 100

## 2010-09-04 MED ORDER — ONDANSETRON HCL 4 MG PO TABS: 4.0000 mg | ORAL_TABLET | Freq: Four times a day (QID) | ORAL | Status: DC | PRN

## 2010-09-04 MED ORDER — FLEET ENEMA 7-19 GM/118ML RE ENEM: 1.0000 | ENEMA | RECTAL | Status: DC | PRN

## 2010-09-04 MED ORDER — DOCUSATE SODIUM 100 MG PO CAPS
100.0000 mg | ORAL_CAPSULE | Freq: Two times a day (BID) | ORAL | Status: DC
  Administered 2010-09-04 – 2010-09-06 (×5): 100 mg via ORAL
  Filled 2010-09-04 (×5): qty 1

## 2010-09-04 MED ORDER — ACETAMINOPHEN 325 MG PO TABS
650.0000 mg | ORAL_TABLET | Freq: Four times a day (QID) | ORAL | Status: DC | PRN
  Administered 2010-09-04: 650 mg via ORAL
  Filled 2010-09-04: qty 2

## 2010-09-04 MED ORDER — POTASSIUM CHLORIDE IN NACL 40-0.9 MEQ/L-% IV SOLN
INTRAVENOUS | Status: AC
  Filled 2010-09-04: qty 1000

## 2010-09-04 MED ORDER — VANCOMYCIN HCL IN DEXTROSE 1-5 GM/200ML-% IV SOLN
1000.0000 mg | INTRAVENOUS | Status: DC
  Administered 2010-09-04 – 2010-09-05 (×2): 1000 mg via INTRAVENOUS
  Filled 2010-09-04: qty 200

## 2010-09-04 MED ORDER — PANTOPRAZOLE SODIUM 40 MG PO TBEC
40.0000 mg | DELAYED_RELEASE_TABLET | Freq: Two times a day (BID) | ORAL | Status: DC
  Administered 2010-09-04 – 2010-09-06 (×5): 40 mg via ORAL
  Filled 2010-09-04 (×5): qty 1

## 2010-09-04 MED ORDER — ACETAMINOPHEN 650 MG RE SUPP
650.0000 mg | Freq: Once | RECTAL | Status: AC
  Administered 2010-09-04: 650 mg via RECTAL
  Filled 2010-09-04: qty 1

## 2010-09-04 MED ORDER — SENNOSIDES-DOCUSATE SODIUM 8.6-50 MG PO TABS: 1.0000 | ORAL_TABLET | Freq: Every day | ORAL | Status: DC | PRN

## 2010-09-04 MED ORDER — METOPROLOL TARTRATE 50 MG PO TABS
50.0000 mg | ORAL_TABLET | Freq: Two times a day (BID) | ORAL | Status: DC
  Administered 2010-09-04 – 2010-09-06 (×5): 50 mg via ORAL
  Filled 2010-09-04 (×5): qty 1

## 2010-09-04 MED ORDER — DEXTROSE 5 % IV SOLN
1.0000 g | Freq: Every day | INTRAVENOUS | Status: DC
  Administered 2010-09-04: 1 g via INTRAVENOUS
  Filled 2010-09-04 (×3): qty 1

## 2010-09-04 NOTE — ED Notes (Signed)
Patient given jello; patient wanting to go home. Explained to patient that he needed to stay for additional testing and treatment.

## 2010-09-04 NOTE — Progress Notes (Signed)
Subjective: This man was admitted yesterday with abdominal pain and was found to be feverish. The etiology of the fever is not clear. He does not appear to have pneumonia. CT scan of his abdomen does not show any abnormalities. He has no specific complaints this morning.           Physical Exam: Blood pressure 172/80, pulse 72, temperature 101.2 F (38.4 C), temperature source Oral, resp. rate 20, height 5\' 4"  (1.626 m), weight 51.4 kg (113 lb 5.1 oz), SpO2 98.00%. He is feverish again this morning. He is possibly slightly delirious as he seems to be somewhat agitated. Heart sounds are present and normal without murmurs. Lung fields are clear, abdomen is soft and nontender. He does have a cystic structure in the anterior neck which I am sure has been present for a long time.   Investigations: Results for orders placed during the hospital encounter of 09/04/10 (from the past 48 hour(s))  BASIC METABOLIC PANEL     Status: Abnormal   Collection Time   09/04/10 12:42 AM      Component Value Range Comment   Sodium 134 (*) 135 - 145 (mEq/L)    Potassium 3.4 (*) 3.5 - 5.1 (mEq/L)    Chloride 96  96 - 112 (mEq/L)    CO2 27  19 - 32 (mEq/L)    Glucose, Bld 93  70 - 99 (mg/dL)    BUN 19  6 - 23 (mg/dL)    Creatinine, Ser 1.19  0.50 - 1.35 (mg/dL)    Calcium 8.7  8.4 - 10.5 (mg/dL)    GFR calc non Af Amer >60  >60 (mL/min)    GFR calc Af Amer >60  >60 (mL/min)   CBC     Status: Abnormal   Collection Time   09/04/10 12:42 AM      Component Value Range Comment   WBC 12.4 (*) 4.0 - 10.5 (K/uL)    RBC 3.63 (*) 4.22 - 5.81 (MIL/uL)    Hemoglobin 11.6 (*) 13.0 - 17.0 (g/dL)    HCT 14.7 (*) 82.9 - 52.0 (%)    MCV 95.0  78.0 - 100.0 (fL)    MCH 32.0  26.0 - 34.0 (pg)    MCHC 33.6  30.0 - 36.0 (g/dL)    RDW 56.2  13.0 - 86.5 (%)    Platelets 87 (*) 150 - 400 (K/uL)   DIFFERENTIAL     Status: Abnormal   Collection Time   09/04/10 12:42 AM      Component Value Range Comment   Neutrophils  Relative 79 (*) 43 - 77 (%)    Neutro Abs 9.8 (*) 1.7 - 7.7 (K/uL)    Lymphocytes Relative 5 (*) 12 - 46 (%)    Lymphs Abs 0.6 (*) 0.7 - 4.0 (K/uL)    Monocytes Relative 17 (*) 3 - 12 (%)    Monocytes Absolute 2.0 (*) 0.1 - 1.0 (K/uL)    Eosinophils Relative 0  0 - 5 (%)    Eosinophils Absolute 0.0  0.0 - 0.7 (K/uL)    Basophils Relative 0  0 - 1 (%)    Basophils Absolute 0.0  0.0 - 0.1 (K/uL)   URINALYSIS, ROUTINE W REFLEX MICROSCOPIC     Status: Abnormal   Collection Time   09/04/10  1:43 AM      Component Value Range Comment   Color, Urine YELLOW  YELLOW     Appearance CLEAR  CLEAR     Specific  Gravity, Urine 1.015  1.005 - 1.030     pH 6.5  5.0 - 8.0     Glucose, UA NEGATIVE  NEGATIVE (mg/dL)    Hgb urine dipstick LARGE (*) NEGATIVE     Bilirubin Urine NEGATIVE  NEGATIVE     Ketones, ur NEGATIVE  NEGATIVE (mg/dL)    Protein, ur 30 (*) NEGATIVE (mg/dL)    Urobilinogen, UA 1.0  0.0 - 1.0 (mg/dL)    Nitrite NEGATIVE  NEGATIVE     Leukocytes, UA NEGATIVE  NEGATIVE    URINE MICROSCOPIC-ADD ON     Status: Normal   Collection Time   09/04/10  1:43 AM      Component Value Range Comment   Squamous Epithelial / LPF RARE  RARE     WBC, UA 0-2  <3 (WBC/hpf)    RBC / HPF 3-6  <3 (RBC/hpf)    Bacteria, UA RARE  RARE    CULTURE, BLOOD (ROUTINE X 2)     Status: Normal (Preliminary result)   Collection Time   09/04/10  2:34 AM      Component Value Range Comment   Specimen Description BLOOD LEFT ANTECUBITAL      Special Requests BOTTLES DRAWN AEROBIC AND ANAEROBIC 8CC      Culture NO GROWTH <24 HRS      Report Status PENDING     CULTURE, BLOOD (ROUTINE X 2)     Status: Normal (Preliminary result)   Collection Time   09/04/10  2:41 AM      Component Value Range Comment   Specimen Description BLOOD RIGHT HAND      Special Requests BOTTLES DRAWN AEROBIC AND ANAEROBIC 6CC      Culture NO GROWTH <24 HRS      Report Status PENDING      Recent Results (from the past 240 hour(s))  CULTURE,  BLOOD (ROUTINE X 2)     Status: Normal (Preliminary result)   Collection Time   09/04/10  2:34 AM      Component Value Range Status Comment   Specimen Description BLOOD LEFT ANTECUBITAL   Final    Special Requests BOTTLES DRAWN AEROBIC AND ANAEROBIC 8CC   Final    Culture NO GROWTH <24 HRS   Final    Report Status PENDING   Incomplete   CULTURE, BLOOD (ROUTINE X 2)     Status: Normal (Preliminary result)   Collection Time   09/04/10  2:41 AM      Component Value Range Status Comment   Specimen Description BLOOD RIGHT HAND   Final    Special Requests BOTTLES DRAWN AEROBIC AND ANAEROBIC 6CC   Final    Culture NO GROWTH <24 HRS   Final    Report Status PENDING   Incomplete     Dg Chest 2 View  09/04/2010  *RADIOLOGY REPORT*  Clinical Data: Fever.  CHEST - 2 VIEW  Comparison: 04/02/2009  Findings: Mild hyperinflation of the lungs. Heart and mediastinal contours are within normal limits.  No focal opacities or effusions.  No acute bony abnormality.  IMPRESSION: Hyperinflation. No active cardiopulmonary disease.  Original Report Authenticated By: Cyndie Chime, M.D.   Ct Abdomen Pelvis W Contrast  09/03/2010  *RADIOLOGY REPORT*  Clinical Data: Generalized abdominal pain, confusion  CT ABDOMEN AND PELVIS WITH CONTRAST  Technique:  Multidetector CT imaging of the abdomen and pelvis was performed following the standard protocol during bolus administration of intravenous contrast. Sagittal and coronal MPR images reconstructed from axial  data set.  Contrast: Dilute oral contrast. 100 ml Omnipaque 300 IV.  Comparison: 03/23/2004  Findings: Lung bases clear. Atherosclerotic calcifications aorta, iliac arteries, coronary arteries, and SMA. Multiple bilateral renal cysts. Tiny nonobstructing calculus left kidney image 30. Liver, spleen, pancreas, kidneys, and adrenal glands otherwise unremarkable. Normal appendix. Enlarged prostate gland.  Numerous pelvic phleboliths. Minimal bladder wall thickening, question  related underdistension versus chronic outlet obstruction. Small amount of dependent free pelvic fluid. Large and small bowel loops unremarkable. Food debris, fluid and contrast in stomach. No definite mass, adenopathy, free fluid, or inflammatory process. Bones unremarkable.  IMPRESSION: Scattered atherosclerotic disease. Bilateral renal cysts and tiny nonobstructing left renal calculus. Prostatic enlargement. No acute intra-abdominal or intrapelvic abnormalities.  Original Report Authenticated By: Lollie Marrow, M.D.      Medications: I have reviewed the patient's current medications.  Impression: 1. Fever of unclear etiology. 2. Abdominal pain, generalized, now no longer a concern. 3. Hypertension. 4. Coronary artery disease. 5. Cerebrovascular disease with history of strokes, multiple in the past.     Plan: 1. Stop intravenous ceftriaxone and replace with intravenous Levaquin for better gram-negative coverage. 2. Ultrasound of the cystic structure in his anterior neck.     LOS: 0 days   GOSRANI,NIMISH C 09/04/2010, 10:03 AM

## 2010-09-04 NOTE — ED Provider Notes (Signed)
History     CSN: [id number] Arrival date & time: 09/04/2010 12:19 AM  Chief Complaint  Patient presents with  . Cerebrovascular Accident   HPI Comments: Per wife, pt came to hospital today for outpatient workup including ct abd/pelvis as he has had recent abd pain Pt was at home tonight and "rolled out of bed" and appeared confused, and also she was concerned he was having a stroke.  He has h/o CVA in the past. Pt is now back to baseline per wife.  Patient is a 77 y.o. male presenting with Acute Neurological Problem. The history is provided by the patient and the spouse. The history is limited by the condition of the patient.  Cerebrovascular Accident This is a new problem. The current episode started 6 to 12 hours ago. The problem occurs constantly. The problem has been gradually improving. Pertinent negatives include no chest pain, no abdominal pain, no headaches and no shortness of breath. The symptoms are aggravated by nothing. The symptoms are relieved by nothing.    Past Medical History  Diagnosis Date  . Stroke   . Hypertension   . Coronary artery disease     Past Surgical History  Procedure Date  . Cardiac surgery     No family history on file.  History  Substance Use Topics  . Smoking status: Never Smoker   . Smokeless tobacco: Not on file  . Alcohol Use: No      Review of Systems  Unable to perform ROS: Dementia  Respiratory: Negative for shortness of breath.   Cardiovascular: Negative for chest pain.  Gastrointestinal: Negative for abdominal pain.  Neurological: Negative for headaches.    Physical Exam  BP 172/78  Pulse 85  Temp(Src) 102.9 F (39.4 C) (Oral)  Resp 18  Ht 5\' 4"  (1.626 m)  Wt 153 lb (69.4 kg)  BMI 26.26 kg/m2  SpO2 100%  Physical Exam CONSTITUTIONAL: Well developed/well nourished HEAD AND FACE: Normocephalic/atraumatic EYES: EOMI/PERRL ENMT: Mucous membranes dry NECK: supple no meningeal signs, goiter noted (baseline for  patient) SPINE:entire spine nontender CV: S1/S2 noted, no murmurs/rubs/gallops noted LUNGS: Lungs are clear to auscultation bilaterally, no apparent distress ABDOMEN: soft, nontender, no rebound or guarding  NEURO: Pt is awake/alert, moves all extremitiesx4, no focal motor deficit is noted, no arm/leg drift.  He is a&O x2 (baseline per wife) EXTREMITIES: pulses normal, full ROM SKIN: warm, color normal PSYCH: no abnormalities of mood noted    ED Course  Procedures  MDM Nursing notes reviewed and considered in documentation Previous records reviewed and considered xrays reviewed and considered All labs/vitals reviewed and considered   12:50 AM pt has no focal motor weakness, baseline mental status per wife, but he is febrile at this time, no signs of CVA at this time Ct abd/pelvis from earlier today was negative      Date: 09/04/2010  Rate: 81  Rhythm: normal sinus rhythm  QRS Axis: normal  Intervals: normal  ST/T Wave abnormalities: nonspecific ST changes  Conduction Disutrbances:none  Narrative Interpretation:   Old EKG Reviewed: unchanged  On reassessment, no clear source of fever.  Wife admits he has had fever for past 24 hours, and had ct performed today due to abd pain Pt is at baseline per wife, he denies headache/cp/sob.   They do not want any further ct head imaging However, she is uncomfortable taking him home due to unclear course of fever I checked his prostate and it was nontender Will admit  D/w dr Orvan Falconer,  will admit  Joya Gaskins, MD 09/04/10 [phone]

## 2010-09-04 NOTE — ED Notes (Signed)
Dr. Orvan Falconer at bedside to assess for admission.

## 2010-09-04 NOTE — H&P (Signed)
PCP:   [name]., MD   Chief Complaint:  fever and abdominal pain since Thursday  HPI: 77yo AAM with h/o multiple strokes, htn, cad, presents with above.Was evaluated by his PCP earlier and CT of abdoment done, Thsi showed no acte problems; Pt on keflex. Pt themn today ahs been showing signs  Of worsenening hysarthria, weakness, so he was brought to the Ed. He is already started on Keflex .     Review of Systems:  The patient denies anorexia, fever, weight loss,, vision loss, decreased hearing, hoarseness, chest pain, syncope, dyspnea on exertion, peripheral edema, balance deficits, hemoptysis, abdominal pain, melena, hematochezia, severe indigestion/heartburn, hematuria, incontinence, genital sores, muscle weakness, suspicious skin lesions, transient blindness, difficulty walking, depression, unusual weight change, abnormal bleeding, enlarged lymph nodes, angioedema, and breast masses.  Past Medical History: Past Medical History  Diagnosis Date  . Stroke   . Hypertension   . Coronary artery disease    Past Surgical History  Procedure Date  . Cardiac surgery     Medications: Prior to Admission medications   Medication Sig Start Date End Date Taking? Authorizing Provider  ALPRAZolam Prudy Feeler) 1 MG tablet Take 1 mg by mouth at bedtime as needed.     Yes Historical Provider, MD  aspirin 81 MG tablet Take 81 mg by mouth daily.     Yes Historical Provider, MD  cephALEXin (KEFLEX) 500 MG capsule Take 500 mg by mouth 4 (four) times daily.     Yes Historical Provider, MD  metoprolol (LOPRESSOR) 50 MG tablet Take 50 mg by mouth 2 (two) times daily.     Yes Historical Provider, MD  omeprazole (PRILOSEC) 20 MG capsule Take 20 mg by mouth daily.     Yes Historical Provider, MD  ramipril (ALTACE) 10 MG tablet Take 10 mg by mouth daily.     Yes Historical Provider, MD  simvastatin (ZOCOR) 40 MG tablet Take 40 mg by mouth at bedtime.     Yes Historical Provider, MD    Allergies:  No  Known Allergies  Social History:  reports that he has never smoked. He does not have any smokeless tobacco history on file. He reports that he does not drink alcohol. His drug history not on file.  Family History: No family history on file.  Physical Exam: Filed Vitals:   09/04/10 0016 09/04/10 0331  BP: 172/78 141/74  Pulse: 85 76  Temp: 102.9 F (39.4 C) 99.1 F (37.3 C)  TempSrc: Oral Oral  Resp: 18 16  Height: 5\' 4"  (1.626 m)   Weight: 69.4 kg (153 lb)   SpO2: 100% 96%   General appearance: alert and slowed mentation Head: Normocephalic, without obvious abnormality, atraumatic Eyes: conjunctivae/corneas clear. PERRL mm dry Nose: Nares normal. . No drainage or sinus tenderness. Neck: no adenopathy, no carotid bruit, no JVD, supple, symmetrical; 6cm cyst/lipoma anterior neck. Back: symmetric, no curvature. ROM normal. No CVA tenderness. Resp: clear to auscultation bilaterally Chest wall: no tenderness Cardio: regular rate and rhythm, S1, S2 normal, no murmur, click, rub or gallop GI: soft, non-tender; bowel sounds increased; no masses,  no organomegaly Extremities: extremities normal, atraumatic, no cyanosis or edema Pulses: 2+ and symmetric Skin: Skin color, texture, turgor normal. No rashes or lesions Neurologic: dysarthric speech; mild right hemiplegia.   Labs on Admission:   [facility] 09/04/10 0042  NA 134*  K 3.4*  CL 96  CO2 27  GLUCOSE 93  BUN 19  CREATININE 1.12  CALCIUM 8.7  MG --  PHOS --    Basename 09/04/10 0042  WBC 12.4*  NEUTROABS 9.8*  HGB 11.6*  HCT 34.5*  MCV 95.0  PLT 87*     Radiological Exams on Admission: Dg Chest 2 View  09/04/2010  *RADIOLOGY REPORT*  Clinical Data: Fever.  CHEST - 2 VIEW  Comparison: 04/02/2009  Findings: Mild hyperinflation of the lungs. Heart and mediastinal contours are within normal limits.  No focal opacities or effusions.  No acute bony abnormality.  IMPRESSION: Hyperinflation. No active cardiopulmonary  disease.  Original Report Authenticated By: Cyndie Chime, M.D.   Ct Abdomen Pelvis W Contrast  09/03/2010  *RADIOLOGY REPORT*  Clinical Data: Generalized abdominal pain, confusion  CT ABDOMEN AND PELVIS WITH CONTRAST  Technique:  Multidetector CT imaging of the abdomen and pelvis was performed following the standard protocol during bolus administration of intravenous contrast. Sagittal and coronal MPR images reconstructed from axial data set.  Contrast: Dilute oral contrast. 100 ml Omnipaque 300 IV.  Comparison: 03/23/2004  Findings: Lung bases clear. Atherosclerotic calcifications aorta, iliac arteries, coronary arteries, and SMA. Multiple bilateral renal cysts. Tiny nonobstructing calculus left kidney image 30. Liver, spleen, pancreas, kidneys, and adrenal glands otherwise unremarkable. Normal appendix. Enlarged prostate gland.  Numerous pelvic phleboliths. Minimal bladder wall thickening, question related underdistension versus chronic outlet obstruction. Small amount of dependent free pelvic fluid. Large and small bowel loops unremarkable. Food debris, fluid and contrast in stomach. No definite mass, adenopathy, free fluid, or inflammatory process. Bones unremarkable.  IMPRESSION: Scattered atherosclerotic disease. Bilateral renal cysts and tiny nonobstructing left renal calculus. Prostatic enlargement. No acute intra-abdominal or intrapelvic abnormalities.  Original Report Authenticated By: Lollie Marrow, M.D.    Assessment/Plan Present on Admission:  .Fever .Abdominal pain, generalized .HTN (hypertension) .CAD (coronary artery disease)  No obvious cause for pts symptoms; possible viral ilness; Wife uncomfortable taking him home; will bring in on obsv and await culture results. Will switch Keflex to Ceftriaxone.  Other plans as per orders.   [name] 09/04/2010, 4:44 AM

## 2010-09-04 NOTE — ED Notes (Signed)
Wife reports pt had lab work and x rays earlier today to eval abd pain, it is reported pt fell out of the bed tonight and appeared to be very weak and unable to ambulate, pt has hx of cva in past with left sided weakness, pt A&0x4 at this time, c/o abd pain

## 2010-09-05 LAB — COMPREHENSIVE METABOLIC PANEL
Albumin: 3 g/dL — ABNORMAL LOW (ref 3.5–5.2)
BUN: 19 mg/dL (ref 6–23)
CO2: 25 mEq/L (ref 19–32)
Chloride: 100 mEq/L (ref 96–112)
Creatinine, Ser: 1 mg/dL (ref 0.50–1.35)
GFR calc Af Amer: >60 mL/min (ref >60)
GFR calc non Af Amer: >60 mL/min (ref >60)
Total Bilirubin: 0.9 mg/dL (ref 0.3–1.2)

## 2010-09-05 LAB — CBC
HCT: 36.4 % — ABNORMAL LOW (ref 39.0–52.0)
MCV: 95.8 fL (ref 78.0–100.0)
RDW: 12.2 % (ref 11.5–15.5)
WBC: 10.8 10*3/uL — ABNORMAL HIGH (ref 4.0–10.5)

## 2010-09-05 MED ORDER — HALOPERIDOL LACTATE 5 MG/ML IJ SOLN
1.0000 mg | Freq: Four times a day (QID) | INTRAMUSCULAR | Status: DC | PRN
  Administered 2010-09-05 – 2010-09-06 (×3): 1 mg via INTRAVENOUS
  Filled 2010-09-05 (×3): qty 1

## 2010-09-05 MED ORDER — VANCOMYCIN HCL IN DEXTROSE 1-5 GM/200ML-% IV SOLN
INTRAVENOUS | Status: AC
  Filled 2010-09-05: qty 200

## 2010-09-05 NOTE — Progress Notes (Signed)
Subjective: This man was admitted with abdominal pain and was found to be feverish. The etiology of the fever is not clear although he is growing gram-positive cocci in his blood in one bottle. He does not appear to have pneumonia. CT scan of his abdomen does not show any abnormalities. He is trying to get out of his bed and walk and is clearly unsteady on his feet. He remains delirious.          Physical Exam: Blood pressure 141/77, pulse 80, temperature 98.2 F (36.8 C), temperature source Oral, resp. rate 18, height 5\' 4"  (1.626 m), weight 51.4 kg (113 lb 5.1 oz), SpO2 95.00%. He has been afebrile for over 24 hour as. He is delirious. Heart sounds are present and normal without murmurs. Lung fields are clear, abdomen is soft and nontender. He does have a cystic structure in the anterior neck which I am sure has been present for a long time.   Investigations: Results for orders placed during the hospital encounter of 09/04/10 (from the past 48 hour(s))  BASIC METABOLIC PANEL     Status: Abnormal   Collection Time   09/04/10 12:42 AM      Component Value Range Comment   Sodium 134 (*) 135 - 145 (mEq/L)    Potassium 3.4 (*) 3.5 - 5.1 (mEq/L)    Chloride 96  96 - 112 (mEq/L)    CO2 27  19 - 32 (mEq/L)    Glucose, Bld 93  70 - 99 (mg/dL)    BUN 19  6 - 23 (mg/dL)    Creatinine, Ser 1.61  0.50 - 1.35 (mg/dL)    Calcium 8.7  8.4 - 10.5 (mg/dL)    GFR calc non Af Amer >60  >60 (mL/min)    GFR calc Af Amer >60  >60 (mL/min)   CBC     Status: Abnormal   Collection Time   09/04/10 12:42 AM      Component Value Range Comment   WBC 12.4 (*) 4.0 - 10.5 (K/uL)    RBC 3.63 (*) 4.22 - 5.81 (MIL/uL)    Hemoglobin 11.6 (*) 13.0 - 17.0 (g/dL)    HCT 09.6 (*) 04.5 - 52.0 (%)    MCV 95.0  78.0 - 100.0 (fL)    MCH 32.0  26.0 - 34.0 (pg)    MCHC 33.6  30.0 - 36.0 (g/dL)    RDW 40.9  81.1 - 91.4 (%)    Platelets 87 (*) 150 - 400 (K/uL)   DIFFERENTIAL     Status: Abnormal   Collection Time   09/04/10 12:42 AM      Component Value Range Comment   Neutrophils Relative 79 (*) 43 - 77 (%)    Neutro Abs 9.8 (*) 1.7 - 7.7 (K/uL)    Lymphocytes Relative 5 (*) 12 - 46 (%)    Lymphs Abs 0.6 (*) 0.7 - 4.0 (K/uL)    Monocytes Relative 17 (*) 3 - 12 (%)    Monocytes Absolute 2.0 (*) 0.1 - 1.0 (K/uL)    Eosinophils Relative 0  0 - 5 (%)    Eosinophils Absolute 0.0  0.0 - 0.7 (K/uL)    Basophils Relative 0  0 - 1 (%)    Basophils Absolute 0.0  0.0 - 0.1 (K/uL)   TSH     Status: Normal   Collection Time   09/04/10 12:42 AM      Component Value Range Comment   TSH 1.832  0.350 - 4.500 (  uIU/mL)   URINALYSIS, ROUTINE W REFLEX MICROSCOPIC     Status: Abnormal   Collection Time   09/04/10  1:43 AM      Component Value Range Comment   Color, Urine YELLOW  YELLOW     Appearance CLEAR  CLEAR     Specific Gravity, Urine 1.015  1.005 - 1.030     pH 6.5  5.0 - 8.0     Glucose, UA NEGATIVE  NEGATIVE (mg/dL)    Hgb urine dipstick LARGE (*) NEGATIVE     Bilirubin Urine NEGATIVE  NEGATIVE     Ketones, ur NEGATIVE  NEGATIVE (mg/dL)    Protein, ur 30 (*) NEGATIVE (mg/dL)    Urobilinogen, UA 1.0  0.0 - 1.0 (mg/dL)    Nitrite NEGATIVE  NEGATIVE     Leukocytes, UA NEGATIVE  NEGATIVE    URINE MICROSCOPIC-ADD ON     Status: Normal   Collection Time   09/04/10  1:43 AM      Component Value Range Comment   Squamous Epithelial / LPF RARE  RARE     WBC, UA 0-2  <3 (WBC/hpf)    RBC / HPF 3-6  <3 (RBC/hpf)    Bacteria, UA RARE  RARE    CULTURE, BLOOD (ROUTINE X 2)     Status: Normal (Preliminary result)   Collection Time   09/04/10  2:34 AM      Component Value Range Comment   Specimen Description BLOOD LEFT ANTECUBITAL      Special Requests BOTTLES DRAWN AEROBIC AND ANAEROBIC 8CC      Culture        Value: GRAM POSITIVE COCCI     Gram Stain Report Called to,Read Back By and Verified With: GIBSON,K @ 1845 ON 09/04/10 BY WOODIE,J     GS DONE @ [facility]   Report Status PENDING     CULTURE, BLOOD (ROUTINE X  2)     Status: Normal (Preliminary result)   Collection Time   09/04/10  2:41 AM      Component Value Range Comment   Specimen Description BLOOD RIGHT HAND      Special Requests BOTTLES DRAWN AEROBIC AND ANAEROBIC 6CC      Culture NO GROWTH 1 DAY      Report Status PENDING     CBC     Status: Abnormal   Collection Time   09/05/10  4:35 AM      Component Value Range Comment   WBC 10.8 (*) 4.0 - 10.5 (K/uL)    RBC 3.80 (*) 4.22 - 5.81 (MIL/uL)    Hemoglobin 12.1 (*) 13.0 - 17.0 (g/dL)    HCT 08.6 (*) 57.8 - 52.0 (%)    MCV 95.8  78.0 - 100.0 (fL)    MCH 31.8  26.0 - 34.0 (pg)    MCHC 33.2  30.0 - 36.0 (g/dL)    RDW 46.9  62.9 - 52.8 (%)    Platelets 104 (*) 150 - 400 (K/uL)   COMPREHENSIVE METABOLIC PANEL     Status: Abnormal   Collection Time   09/05/10  4:35 AM      Component Value Range Comment   Sodium 135  135 - 145 (mEq/L)    Potassium 3.5  3.5 - 5.1 (mEq/L)    Chloride 100  96 - 112 (mEq/L)    CO2 25  19 - 32 (mEq/L)    Glucose, Bld 82  70 - 99 (mg/dL)    BUN 19  6 -  23 (mg/dL)    Creatinine, Ser 1.61  0.50 - 1.35 (mg/dL)    Calcium 8.5  8.4 - 10.5 (mg/dL)    Total Protein 6.4  6.0 - 8.3 (g/dL)    Albumin 3.0 (*) 3.5 - 5.2 (g/dL)    AST 64 (*) 0 - 37 (U/L)    ALT 41  0 - 53 (U/L)    Alkaline Phosphatase 71  39 - 117 (U/L)    Total Bilirubin 0.9  0.3 - 1.2 (mg/dL)    GFR calc non Af Amer >60  >60 (mL/min)    GFR calc Af Amer >60  >60 (mL/min)    Recent Results (from the past 240 hour(s))  CULTURE, BLOOD (ROUTINE X 2)     Status: Normal (Preliminary result)   Collection Time   09/04/10  2:34 AM      Component Value Range Status Comment   Specimen Description BLOOD LEFT ANTECUBITAL   Final    Special Requests BOTTLES DRAWN AEROBIC AND ANAEROBIC 8CC   Final    Culture     Final    Value: GRAM POSITIVE COCCI     Gram Stain Report Called to,Read Back By and Verified With: GIBSON,K @ 1845 ON 09/04/10 BY WOODIE,J     GS DONE @ [facility]   Report Status PENDING   Incomplete     CULTURE, BLOOD (ROUTINE X 2)     Status: Normal (Preliminary result)   Collection Time   09/04/10  2:41 AM      Component Value Range Status Comment   Specimen Description BLOOD RIGHT HAND   Final    Special Requests BOTTLES DRAWN AEROBIC AND ANAEROBIC 6CC   Final    Culture NO GROWTH 1 DAY   Final    Report Status PENDING   Incomplete     Dg Chest 2 View  09/04/2010  *RADIOLOGY REPORT*  Clinical Data: Fever.  CHEST - 2 VIEW  Comparison: 04/02/2009  Findings: Mild hyperinflation of the lungs. Heart and mediastinal contours are within normal limits.  No focal opacities or effusions.  No acute bony abnormality.  IMPRESSION: Hyperinflation. No active cardiopulmonary disease.  Original Report Authenticated By: Cyndie Chime, M.D.   Ct Abdomen Pelvis W Contrast  09/03/2010  *RADIOLOGY REPORT*  Clinical Data: Generalized abdominal pain, confusion  CT ABDOMEN AND PELVIS WITH CONTRAST  Technique:  Multidetector CT imaging of the abdomen and pelvis was performed following the standard protocol during bolus administration of intravenous contrast. Sagittal and coronal MPR images reconstructed from axial data set.  Contrast: Dilute oral contrast. 100 ml Omnipaque 300 IV.  Comparison: 03/23/2004  Findings: Lung bases clear. Atherosclerotic calcifications aorta, iliac arteries, coronary arteries, and SMA. Multiple bilateral renal cysts. Tiny nonobstructing calculus left kidney image 30. Liver, spleen, pancreas, kidneys, and adrenal glands otherwise unremarkable. Normal appendix. Enlarged prostate gland.  Numerous pelvic phleboliths. Minimal bladder wall thickening, question related underdistension versus chronic outlet obstruction. Small amount of dependent free pelvic fluid. Large and small bowel loops unremarkable. Food debris, fluid and contrast in stomach. No definite mass, adenopathy, free fluid, or inflammatory process. Bones unremarkable.  IMPRESSION: Scattered atherosclerotic disease. Bilateral renal cysts  and tiny nonobstructing left renal calculus. Prostatic enlargement. No acute intra-abdominal or intrapelvic abnormalities.  Original Report Authenticated By: Lollie Marrow, M.D.      Medications: I have reviewed the patient's current medications.  Impression: 1. Fever of unclear etiology. Gram-positive cocci growing in one bottle. He was started on intravenous vancomycin yesterday  in addition to the Levaquin. 2. Abdominal pain, generalized, now no longer a concern. 3. Hypertension. 4. Coronary artery disease. 5. Cerebrovascular disease with history of strokes, multiple in the past.     Plan: 1. Continue with intravenous antibiotics. 2. Intravenous haloperidol when necessary for agitation/hearing. 3. Continue to monitor.     LOS: 1 day   Sharnetta Gielow C 09/05/2010, 9:05 AM

## 2010-09-05 NOTE — Progress Notes (Signed)
Patient  confused. Oriented only to person. Appears to have some type of dementia.  Unscrewed IV several times. Pulling on IV repeatedly. Very agitated. Talking about "working here." Pulling on tubes from IV site. Stating that he is getting in his truck and going home. Tried to reorient. Unsuccessful. Gave Xanax. Continued monitoring. Rolm Bookbinder. Lakina Mcintire,RN

## 2010-09-05 NOTE — Progress Notes (Signed)
ANTIBIOTIC CONSULT NOTE -   Pharmacy Consult for Vancomycin Indication: Fever. GPC 1/2 BC  Patient Measurements: Height: 5\' 4"  (162.6 cm) Weight: 113 lb 5.1 oz (51.4 kg) IBW/kg (Calculated) : 59.2  Adjusted Body Weight: N/A  Vital Signs: Temp: 98.2 F (36.8 C) (08/26 0627) Temp src: Oral (08/26 0627) BP: 141/77 mmHg (08/26 0627) Pulse Rate: 80  (08/26 0627) Intake/Output from previous day: 08/25 0701 - 08/26 0700 In: 1553 [P.O.:640; I.V.:813; IV Piggyback:100] Out: 100 [Urine:100] Intake/Output from this shift:    Labs:  [facility] 09/05/10 0435 09/04/10 0042  WBC 10.8* 12.4*  HGB 12.1* 11.6*  PLT 104* 87*  LABCREA -- --  CREATININE 1.00 1.12  CRCLEARANCE -- --      Microbiology: Recent Results (from the past 720 hour(s))  CULTURE, BLOOD (ROUTINE X 2)     Status: Normal (Preliminary result)   Collection Time   09/04/10  2:34 AM      Component Value Range Status Comment   Specimen Description BLOOD LEFT ANTECUBITAL   Final    Special Requests BOTTLES DRAWN AEROBIC AND ANAEROBIC 8CC   Final    Culture     Final    Value: GRAM POSITIVE COCCI     Gram Stain Report Called to,Read Back By and Verified With: GIBSON,K @ 1845 ON 09/04/10 BY WOODIE,J     GS DONE @ [facility]   Report Status PENDING   Incomplete   CULTURE, BLOOD (ROUTINE X 2)     Status: Normal (Preliminary result)   Collection Time   09/04/10  2:41 AM      Component Value Range Status Comment   Specimen Description BLOOD RIGHT HAND   Final    Special Requests BOTTLES DRAWN AEROBIC AND ANAEROBIC 6CC   Final    Culture NO GROWTH 1 DAY   Final    Report Status PENDING   Incomplete     Medical History: Past Medical History  Diagnosis Date  . Stroke   . Hypertension   . Coronary artery disease     Medications:   In addition to Vancomycin patient is receiving Levaquin IV.  Assessment: Empiric therapy.  Goal of Therapy:   Vancomycin trough level 15-20 mcg/ml  Plan:  Measure antibiotic drug levels at  steady state Follow up culture results Vancomycin 1gram IV q24hrs.  Gilman Buttner, [place] J 09/05/2010,9:24 AM

## 2010-09-06 ENCOUNTER — Observation Stay (HOSPITAL_COMMUNITY)
Admit: 2010-09-06 | Discharge: 2010-09-06 | Disposition: A | Discharge status: Home / Self Care | Payer: Medicare Other | Attending: Internal Medicine | Admitting: Internal Medicine

## 2010-09-06 LAB — CBC
Platelets: 98 10*3/uL — ABNORMAL LOW (ref 150–400)
RBC: 3.59 MIL/uL — ABNORMAL LOW (ref 4.22–5.81)
WBC: 6.5 10*3/uL (ref 4.0–10.5)

## 2010-09-06 LAB — COMPREHENSIVE METABOLIC PANEL
ALT: 35 U/L (ref 0–53)
AST: 45 U/L — ABNORMAL HIGH (ref 0–37)
CO2: 27 mEq/L (ref 19–32)
Chloride: 102 mEq/L (ref 96–112)
GFR calc non Af Amer: >60 mL/min (ref >60)
Potassium: 4 mEq/L (ref 3.5–5.1)
Sodium: 135 mEq/L (ref 135–145)
Total Bilirubin: 0.8 mg/dL (ref 0.3–1.2)

## 2010-09-06 MED ORDER — HALOPERIDOL 0.5 MG PO TABS: 0.5000 mg | ORAL_TABLET | Freq: Two times a day (BID) | ORAL | Status: AC | PRN

## 2010-09-06 MED ORDER — LEVOFLOXACIN 750 MG PO TABS: 750.0000 mg | ORAL_TABLET | Freq: Every day | ORAL | Status: AC

## 2010-09-06 NOTE — Discharge Summary (Signed)
Physician Discharge Summary  Patient ID: [name] MRN: [id number] DOB/AGE: Jan 10, 1930 77 y.o. Primary Care Physician:FUSCO,LAWRENCE J., MD Admit date: 09/04/2010 Discharge date: 09/06/2010    Discharge Diagnoses:  1. Fever likely secondary to gram-positive cocci bacteremia, specific organism identification pending. Afebrile and back to baseline. 2. Delirium, secondary to #1 and also underlying dementia her per wife. Back to baseline. 3. Cerebrovascular disease, with multiple strokes in the past. 4. Hypertension. 5. Abdominal pain, resolved, unclear etiology. 6. Coronary artery disease, stable   Current Discharge Medication List    START taking these medications   Details  haloperidol (HALDOL) 0.5 MG tablet Take 1 tablet (0.5 mg total) by mouth 2 (two) times daily as needed. Qty: 60 tablet, Refills: 0    levofloxacin (LEVAQUIN) 750 MG tablet Take 1 tablet (750 mg total) by mouth daily. Qty: 7 tablet, Refills: 0      CONTINUE these medications which have NOT CHANGED   Details  ALPRAZolam (XANAX) 1 MG tablet Take 1 mg by mouth 2 (two) times daily.      aspirin 81 MG tablet Take 162 mg by mouth daily.      docusate sodium (COLACE) 100 MG capsule Take 100 mg by mouth 3 (three) times daily.      metoprolol (LOPRESSOR) 50 MG tablet Take 50 mg by mouth daily.      omeprazole (PRILOSEC) 20 MG capsule Take 20 mg by mouth daily.      ramipril (ALTACE) 10 MG tablet Take 10 mg by mouth daily.      simvastatin (ZOCOR) 40 MG tablet Take 40 mg by mouth at bedtime.        STOP taking these medications     cephALEXin (KEFLEX) 500 MG capsule      cephALEXin (KEFLEX) 500 MG capsule         Discharged Condition: Stable and improved.    Consults: None.  Significant Diagnostic Studies: Dg Chest 2 View  09/04/2010  *RADIOLOGY REPORT*  Clinical Data: Fever.  CHEST - 2 VIEW  Comparison: 04/02/2009  Findings: Mild hyperinflation of the lungs. Heart and mediastinal contours  are within normal limits.  No focal opacities or effusions.  No acute bony abnormality.  IMPRESSION: Hyperinflation. No active cardiopulmonary disease.  Original Report Authenticated By: Cyndie Chime, M.D.   Ct Abdomen Pelvis W Contrast  09/03/2010  *RADIOLOGY REPORT*  Clinical Data: Generalized abdominal pain, confusion  CT ABDOMEN AND PELVIS WITH CONTRAST  Technique:  Multidetector CT imaging of the abdomen and pelvis was performed following the standard protocol during bolus administration of intravenous contrast. Sagittal and coronal MPR images reconstructed from axial data set.  Contrast: Dilute oral contrast. 100 ml Omnipaque 300 IV.  Comparison: 03/23/2004  Findings: Lung bases clear. Atherosclerotic calcifications aorta, iliac arteries, coronary arteries, and SMA. Multiple bilateral renal cysts. Tiny nonobstructing calculus left kidney image 30. Liver, spleen, pancreas, kidneys, and adrenal glands otherwise unremarkable. Normal appendix. Enlarged prostate gland.  Numerous pelvic phleboliths. Minimal bladder wall thickening, question related underdistension versus chronic outlet obstruction. Small amount of dependent free pelvic fluid. Large and small bowel loops unremarkable. Food debris, fluid and contrast in stomach. No definite mass, adenopathy, free fluid, or inflammatory process. Bones unremarkable.  IMPRESSION: Scattered atherosclerotic disease. Bilateral renal cysts and tiny nonobstructing left renal calculus. Prostatic enlargement. No acute intra-abdominal or intrapelvic abnormalities.  Original Report Authenticated By: Lollie Marrow, M.D.    Lab Results: Results for orders placed during the hospital encounter of 09/04/10 (from the  past 48 hour(s))  CBC     Status: Abnormal   Collection Time   09/05/10  4:35 AM      Component Value Range Comment   WBC 10.8 (*) 4.0 - 10.5 (K/uL)    RBC 3.80 (*) 4.22 - 5.81 (MIL/uL)    Hemoglobin 12.1 (*) 13.0 - 17.0 (g/dL)    HCT 14.7 (*) 82.9 - 52.0  (%)    MCV 95.8  78.0 - 100.0 (fL)    MCH 31.8  26.0 - 34.0 (pg)    MCHC 33.2  30.0 - 36.0 (g/dL)    RDW 56.2  13.0 - 86.5 (%)    Platelets 104 (*) 150 - 400 (K/uL)   COMPREHENSIVE METABOLIC PANEL     Status: Abnormal   Collection Time   09/05/10  4:35 AM      Component Value Range Comment   Sodium 135  135 - 145 (mEq/L)    Potassium 3.5  3.5 - 5.1 (mEq/L)    Chloride 100  96 - 112 (mEq/L)    CO2 25  19 - 32 (mEq/L)    Glucose, Bld 82  70 - 99 (mg/dL)    BUN 19  6 - 23 (mg/dL)    Creatinine, Ser 7.84  0.50 - 1.35 (mg/dL)    Calcium 8.5  8.4 - 10.5 (mg/dL)    Total Protein 6.4  6.0 - 8.3 (g/dL)    Albumin 3.0 (*) 3.5 - 5.2 (g/dL)    AST 64 (*) 0 - 37 (U/L)    ALT 41  0 - 53 (U/L)    Alkaline Phosphatase 71  39 - 117 (U/L)    Total Bilirubin 0.9  0.3 - 1.2 (mg/dL)    GFR calc non Af Amer >60  >60 (mL/min)    GFR calc Af Amer >60  >60 (mL/min)   CBC     Status: Abnormal   Collection Time   09/06/10  4:51 AM      Component Value Range Comment   WBC 6.5  4.0 - 10.5 (K/uL)    RBC 3.59 (*) 4.22 - 5.81 (MIL/uL)    Hemoglobin 11.5 (*) 13.0 - 17.0 (g/dL)    HCT 69.6 (*) 29.5 - 52.0 (%)    MCV 94.7  78.0 - 100.0 (fL)    MCH 32.0  26.0 - 34.0 (pg)    MCHC 33.8  30.0 - 36.0 (g/dL)    RDW 28.4  13.2 - 44.0 (%)    Platelets 98 (*) 150 - 400 (K/uL)   COMPREHENSIVE METABOLIC PANEL     Status: Abnormal   Collection Time   09/06/10  4:51 AM      Component Value Range Comment   Sodium 135  135 - 145 (mEq/L)    Potassium 4.0  3.5 - 5.1 (mEq/L)    Chloride 102  96 - 112 (mEq/L)    CO2 27  19 - 32 (mEq/L)    Glucose, Bld 106 (*) 70 - 99 (mg/dL)    BUN 15  6 - 23 (mg/dL)    Creatinine, Ser 1.02  0.50 - 1.35 (mg/dL)    Calcium 8.6  8.4 - 10.5 (mg/dL)    Total Protein 6.0  6.0 - 8.3 (g/dL)    Albumin 2.8 (*) 3.5 - 5.2 (g/dL)    AST 45 (*) 0 - 37 (U/L)    ALT 35  0 - 53 (U/L)    Alkaline Phosphatase 62  39 - 117 (U/L)  Total Bilirubin 0.8  0.3 - 1.2 (mg/dL)    GFR calc non Af Amer >60   >60 (mL/min)    GFR calc Af Amer >60  >60 (mL/min)    Recent Results (from the past 240 hour(s))  CULTURE, BLOOD (ROUTINE X 2)     Status: Normal (Preliminary result)   Collection Time   09/04/10  2:34 AM      Component Value Range Status Comment   Specimen Description BLOOD LEFT ANTECUBITAL   Final    Special Requests BOTTLES DRAWN AEROBIC AND ANAEROBIC 8CC   Final    Setup Time [id number]   Final    Culture     Final    Value: GRAM POSITIVE COCCI     Note: Gram Stain Report Called to,Read Back By and Verified With: GIBSON K @ 1845 ON 09/04/10 BY Cristela Blue J Performed at [facility]   Report Status PENDING   Incomplete   URINE CULTURE     Status: Normal (Preliminary result)   Collection Time   09/04/10  2:35 AM      Component Value Range Status Comment   Specimen Description URINE, CATHETERIZED   Final    Special Requests NONE   Final    Setup Time [id number]   Final    Colony Count 45,000 COLONIES/ML   Final    Culture ENTEROCOCCUS SPECIES   Final    Report Status PENDING   Incomplete   CULTURE, BLOOD (ROUTINE X 2)     Status: Normal (Preliminary result)   Collection Time   09/04/10  2:41 AM      Component Value Range Status Comment   Specimen Description BLOOD RIGHT HAND   Final    Special Requests BOTTLES DRAWN AEROBIC AND ANAEROBIC 6CC   Final    Culture NO GROWTH 1 DAY   Final    Report Status PENDING   Incomplete      Hospital Course: This 77 year old man who has a history of multiple CVAs and, according to his wife, also has dementia, presented with abdominal pain and fever. The etiology of the abdominal pain was not clear and blood cultures in one bottle are growing gram-positive cocci. Since he is improved and back to his baseline, with delirium having improved, I think it is safe to convert him into oral antibiotics. The patient's wife feels very comfortable taking him home now. 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 The wife tells me that this has been present since birth and therefore I do not feel it is of any major significance pathologically.  Discharge Exam: Blood pressure 156/86, pulse 96, temperature 97.9 F (36.6 C), temperature source Oral, resp. rate 20, height 5\' 4"  (1.626 m), weight 51.4 kg (113 lb 5.1 oz), SpO2 98.00%. He looks systemically well. There are no new physical findings except his lung fields are clear, heart sounds are present and normal without murmurs. His abdomen is soft and nontender. Neurologically he has no new focal neurological signs.  Disposition: Home. He will followup with his primary care physician in the next week or so.  Discharge Orders    Future Orders Please Complete By Expires   Diet - low sodium heart healthy      Increase activity slowly      Discharge instructions      Comments:   Please make sure that your primary care physician checks on the results  of the ultrasound of the cyst in your neck and also the results of blood cultures.        SignedWilson Singer 09/06/2010, 10:52 AM

## 2010-09-06 NOTE — Progress Notes (Signed)
ivf d/ced site wnl,prescriptions given,states understanding of discharge. 

## 2010-09-07 LAB — CULTURE, BLOOD (ROUTINE X 2)

## 2010-09-07 LAB — URINE CULTURE

## 2010-09-09 LAB — CULTURE, BLOOD (ROUTINE X 2)

## 2010-09-14 ENCOUNTER — Other Ambulatory Visit (HOSPITAL_COMMUNITY): Payer: Self-pay | Admitting: Internal Medicine

## 2010-09-14 DIAGNOSIS — Z79899 Other long term (current) drug therapy: Secondary | ICD-10-CM

## 2010-09-17 ENCOUNTER — Ambulatory Visit (HOSPITAL_COMMUNITY): Payer: Medicare Other

## 2010-09-20 NOTE — Progress Notes (Signed)
Encounter addended by: Clarene Critchley on: 09/20/2010 12:20 PM<BR>     Documentation filed: Flowsheet VN

## 2010-10-15 LAB — PROTIME-INR
INR: 1.1 (ref 0.00–1.49)
Prothrombin Time: 14.2 seconds (ref 11.6–15.2)

## 2010-10-15 LAB — POCT CARDIAC MARKERS
Myoglobin, poc: 125 ng/mL (ref 12–200)
Troponin i, poc: <0.05 ng/mL (ref 0.00–0.09)

## 2010-10-15 LAB — COMPREHENSIVE METABOLIC PANEL
AST: 22 U/L (ref 0–37)
CO2: 30 mEq/L (ref 19–32)
Calcium: 8.9 mg/dL (ref 8.4–10.5)
Creatinine, Ser: 1.24 mg/dL (ref 0.4–1.5)
GFR calc Af Amer: >60 mL/min (ref >60)
GFR calc non Af Amer: 57 mL/min — ABNORMAL LOW (ref >60)
Total Protein: 6.5 g/dL (ref 6.0–8.3)

## 2010-10-15 LAB — DIFFERENTIAL
Eosinophils Relative: 1 % (ref 0–5)
Eosinophils Relative: 2 % (ref 0–5)
Lymphocytes Relative: 15 % (ref 12–46)
Lymphocytes Relative: 21 % (ref 12–46)
Lymphs Abs: 0.8 10*3/uL (ref 0.7–4.0)
Lymphs Abs: 1.6 10*3/uL (ref 0.7–4.0)
Monocytes Absolute: 0.1 10*3/uL (ref 0.1–1.0)
Monocytes Relative: 12 % (ref 3–12)

## 2010-10-15 LAB — CBC
HCT: 39.8 % (ref 39.0–52.0)
Hemoglobin: 13.3 g/dL (ref 13.0–17.0)
MCHC: 33.1 g/dL (ref 30.0–36.0)
MCV: 96.2 fL (ref 78.0–100.0)
Platelets: 132 10*3/uL — ABNORMAL LOW (ref 150–400)
RBC: 3.98 MIL/uL — ABNORMAL LOW (ref 4.22–5.81)
RDW: 11.9 % (ref 11.5–15.5)
WBC: 5.5 10*3/uL (ref 4.0–10.5)

## 2010-10-15 LAB — RPR: RPR Ser Ql: NONREACTIVE

## 2010-10-15 LAB — BASIC METABOLIC PANEL
GFR calc non Af Amer: 59 mL/min — ABNORMAL LOW (ref >60)
Potassium: 4.6 mEq/L (ref 3.5–5.1)
Sodium: 141 mEq/L (ref 135–145)

## 2010-10-15 LAB — VITAMIN B12: Vitamin B-12: 546 pg/mL (ref 211–911)

## 2010-10-15 LAB — URINALYSIS, ROUTINE W REFLEX MICROSCOPIC
Bilirubin Urine: NEGATIVE
Glucose, UA: NEGATIVE mg/dL
Hgb urine dipstick: NEGATIVE
Protein, ur: NEGATIVE mg/dL
Urobilinogen, UA: 0.2 mg/dL (ref 0.0–1.0)

## 2010-10-15 LAB — B-NATRIURETIC PEPTIDE (CONVERTED LAB): Pro B Natriuretic peptide (BNP): 31.4 pg/mL (ref 0.0–100.0)

## 2010-10-15 LAB — TSH: TSH: 0.95 u[IU]/mL (ref 0.350–4.500)

## 2010-12-28 ENCOUNTER — Other Ambulatory Visit: Payer: Self-pay

## 2010-12-28 ENCOUNTER — Encounter (HOSPITAL_COMMUNITY): Payer: Self-pay | Admitting: *Deleted

## 2010-12-28 ENCOUNTER — Emergency Department (HOSPITAL_COMMUNITY): Payer: Medicare Other

## 2010-12-28 ENCOUNTER — Inpatient Hospital Stay (HOSPITAL_COMMUNITY)
Admission: EM | Admit: 2010-12-28 | Discharge: 2011-01-05 | DRG: 057 | Disposition: A | Discharge status: Home Health | Payer: Medicare Other | Attending: Internal Medicine | Admitting: Internal Medicine

## 2010-12-28 DIAGNOSIS — F05 Delirium due to known physiological condition: Secondary | ICD-10-CM | POA: Diagnosis present

## 2010-12-28 DIAGNOSIS — R296 Repeated falls: Secondary | ICD-10-CM

## 2010-12-28 DIAGNOSIS — D696 Thrombocytopenia, unspecified: Secondary | ICD-10-CM

## 2010-12-28 DIAGNOSIS — R531 Weakness: Secondary | ICD-10-CM

## 2010-12-28 DIAGNOSIS — R627 Adult failure to thrive: Secondary | ICD-10-CM | POA: Diagnosis present

## 2010-12-28 DIAGNOSIS — E876 Hypokalemia: Secondary | ICD-10-CM

## 2010-12-28 DIAGNOSIS — I651 Occlusion and stenosis of basilar artery: Secondary | ICD-10-CM

## 2010-12-28 DIAGNOSIS — Q892 Congenital malformations of other endocrine glands: Secondary | ICD-10-CM

## 2010-12-28 DIAGNOSIS — G5630 Lesion of radial nerve, unspecified upper limb: Secondary | ICD-10-CM

## 2010-12-28 DIAGNOSIS — I10 Essential (primary) hypertension: Secondary | ICD-10-CM

## 2010-12-28 DIAGNOSIS — R509 Fever, unspecified: Secondary | ICD-10-CM

## 2010-12-28 DIAGNOSIS — I672 Cerebral atherosclerosis: Secondary | ICD-10-CM | POA: Diagnosis present

## 2010-12-28 DIAGNOSIS — I69328 Other speech and language deficits following cerebral infarction: Secondary | ICD-10-CM

## 2010-12-28 DIAGNOSIS — F0390 Unspecified dementia without behavioral disturbance: Secondary | ICD-10-CM

## 2010-12-28 DIAGNOSIS — R1084 Generalized abdominal pain: Secondary | ICD-10-CM

## 2010-12-28 DIAGNOSIS — I663 Occlusion and stenosis of cerebellar arteries: Secondary | ICD-10-CM | POA: Diagnosis present

## 2010-12-28 DIAGNOSIS — R001 Bradycardia, unspecified: Secondary | ICD-10-CM | POA: Diagnosis present

## 2010-12-28 DIAGNOSIS — G2401 Drug induced subacute dyskinesia: Secondary | ICD-10-CM

## 2010-12-28 DIAGNOSIS — R1310 Dysphagia, unspecified: Secondary | ICD-10-CM | POA: Diagnosis present

## 2010-12-28 DIAGNOSIS — I498 Other specified cardiac arrhythmias: Secondary | ICD-10-CM | POA: Diagnosis present

## 2010-12-28 DIAGNOSIS — Z66 Do not resuscitate: Secondary | ICD-10-CM | POA: Diagnosis present

## 2010-12-28 DIAGNOSIS — T434X5A Adverse effect of butyrophenone and thiothixene neuroleptics, initial encounter: Secondary | ICD-10-CM | POA: Diagnosis present

## 2010-12-28 DIAGNOSIS — I6629 Occlusion and stenosis of unspecified posterior cerebral artery: Secondary | ICD-10-CM

## 2010-12-28 DIAGNOSIS — R6251 Failure to thrive (child): Secondary | ICD-10-CM | POA: Diagnosis present

## 2010-12-28 DIAGNOSIS — I2510 Atherosclerotic heart disease of native coronary artery without angina pectoris: Secondary | ICD-10-CM

## 2010-12-28 DIAGNOSIS — T43505A Adverse effect of unspecified antipsychotics and neuroleptics, initial encounter: Secondary | ICD-10-CM | POA: Diagnosis present

## 2010-12-28 DIAGNOSIS — R1312 Dysphagia, oropharyngeal phase: Secondary | ICD-10-CM | POA: Diagnosis present

## 2010-12-28 DIAGNOSIS — D649 Anemia, unspecified: Secondary | ICD-10-CM

## 2010-12-28 DIAGNOSIS — R269 Unspecified abnormalities of gait and mobility: Secondary | ICD-10-CM

## 2010-12-28 DIAGNOSIS — G259 Extrapyramidal and movement disorder, unspecified: Principal | ICD-10-CM | POA: Diagnosis present

## 2010-12-28 DIAGNOSIS — R29898 Other symptoms and signs involving the musculoskeletal system: Secondary | ICD-10-CM

## 2010-12-28 DIAGNOSIS — I69998 Other sequelae following unspecified cerebrovascular disease: Secondary | ICD-10-CM

## 2010-12-28 DIAGNOSIS — G5690 Unspecified mononeuropathy of unspecified upper limb: Secondary | ICD-10-CM | POA: Diagnosis present

## 2010-12-28 DIAGNOSIS — Z9181 History of falling: Secondary | ICD-10-CM

## 2010-12-28 HISTORY — DX: Occlusion and stenosis of basilar artery: I65.1

## 2010-12-28 HISTORY — DX: Anemia, unspecified: D64.9

## 2010-12-28 HISTORY — DX: Thrombocytopenia, unspecified: D69.6

## 2010-12-28 HISTORY — DX: Congenital malformations of other endocrine glands: Q89.2

## 2010-12-28 HISTORY — DX: Delirium due to known physiological condition: F05

## 2010-12-28 HISTORY — DX: Lesion of radial nerve, unspecified upper limb: G56.30

## 2010-12-28 HISTORY — DX: Drug induced subacute dyskinesia: G24.01

## 2010-12-28 HISTORY — DX: Unspecified dementia, unspecified severity, without behavioral disturbance, psychotic disturbance, mood disturbance, and anxiety: F03.90

## 2010-12-28 HISTORY — DX: Occlusion and stenosis of unspecified posterior cerebral artery: I66.29

## 2010-12-28 HISTORY — DX: Repeated falls: R29.6

## 2010-12-28 HISTORY — DX: Occlusion and stenosis of cerebellar arteries: I66.3

## 2010-12-28 LAB — URINALYSIS, ROUTINE W REFLEX MICROSCOPIC
Bilirubin Urine: NEGATIVE
Glucose, UA: NEGATIVE mg/dL
Hgb urine dipstick: NEGATIVE
Ketones, ur: NEGATIVE mg/dL
Nitrite: NEGATIVE
pH: 6 (ref 5.0–8.0)

## 2010-12-28 LAB — URINE MICROSCOPIC-ADD ON

## 2010-12-28 LAB — BASIC METABOLIC PANEL
BUN: 19 mg/dL (ref 6–23)
Chloride: 105 mEq/L (ref 96–112)
Creatinine, Ser: 0.97 mg/dL (ref 0.50–1.35)
GFR calc Af Amer: 89 mL/min — ABNORMAL LOW (ref >90)
Glucose, Bld: 74 mg/dL (ref 70–99)

## 2010-12-28 LAB — DIFFERENTIAL
Basophils Relative: 1 % (ref 0–1)
Monocytes Absolute: 1.1 10*3/uL — ABNORMAL HIGH (ref 0.1–1.0)
Monocytes Relative: 19 % — ABNORMAL HIGH (ref 3–12)
Neutro Abs: 3.3 10*3/uL (ref 1.7–7.7)

## 2010-12-28 LAB — CBC
HCT: 36.1 % — ABNORMAL LOW (ref 39.0–52.0)
Hemoglobin: 12 g/dL — ABNORMAL LOW (ref 13.0–17.0)
MCH: 32.9 pg (ref 26.0–34.0)
MCHC: 33.2 g/dL (ref 30.0–36.0)

## 2010-12-28 LAB — CARDIAC PANEL(CRET KIN+CKTOT+MB+TROPI)
Relative Index: INVALID (ref 0.0–2.5)
Troponin I: <0.3 ng/mL (ref <0.30)

## 2010-12-28 MED ORDER — POTASSIUM CHLORIDE IN NACL 20-0.9 MEQ/L-% IV SOLN
INTRAVENOUS | Status: DC
  Administered 2010-12-29 – 2010-12-30 (×2): via INTRAVENOUS

## 2010-12-28 MED ORDER — BISACODYL 10 MG RE SUPP: 10.0000 mg | Freq: Every day | RECTAL | Status: DC | PRN

## 2010-12-28 MED ORDER — PANTOPRAZOLE SODIUM 40 MG PO TBEC
40.0000 mg | DELAYED_RELEASE_TABLET | Freq: Every day | ORAL | Status: DC
  Administered 2010-12-29 (×2): 40 mg via ORAL
  Filled 2010-12-28 (×2): qty 1

## 2010-12-28 MED ORDER — HYDRALAZINE HCL 20 MG/ML IJ SOLN: 5.0000 mg | INTRAMUSCULAR | Status: DC | PRN

## 2010-12-28 MED ORDER — HALOPERIDOL 0.5 MG PO TABS
0.5000 mg | ORAL_TABLET | Freq: Every day | ORAL | Status: DC
  Filled 2010-12-28 (×3): qty 1

## 2010-12-28 MED ORDER — METOPROLOL SUCCINATE ER 50 MG PO TB24
50.0000 mg | ORAL_TABLET | Freq: Every day | ORAL | Status: DC
  Filled 2010-12-28: qty 1

## 2010-12-28 MED ORDER — FLEET ENEMA 7-19 GM/118ML RE ENEM: 1.0000 | ENEMA | Freq: Once | RECTAL | Status: AC | PRN

## 2010-12-28 MED ORDER — OLANZAPINE 5 MG PO TABS: 2.5000 mg | ORAL_TABLET | ORAL | Status: DC

## 2010-12-28 MED ORDER — ONDANSETRON HCL 4 MG/2ML IJ SOLN: 4.0000 mg | Freq: Four times a day (QID) | INTRAMUSCULAR | Status: DC | PRN

## 2010-12-28 MED ORDER — SENNOSIDES-DOCUSATE SODIUM 8.6-50 MG PO TABS: 1.0000 | ORAL_TABLET | Freq: Every evening | ORAL | Status: DC | PRN

## 2010-12-28 MED ORDER — RAMIPRIL 10 MG PO CAPS
10.0000 mg | ORAL_CAPSULE | Freq: Every day | ORAL | Status: DC
  Administered 2010-12-29: 10 mg via ORAL
  Filled 2010-12-28: qty 1

## 2010-12-28 MED ORDER — ACETAMINOPHEN 650 MG RE SUPP: 650.0000 mg | Freq: Four times a day (QID) | RECTAL | Status: DC | PRN

## 2010-12-28 MED ORDER — ASPIRIN 81 MG PO CHEW
162.0000 mg | CHEWABLE_TABLET | Freq: Every day | ORAL | Status: DC
  Administered 2010-12-29: 162 mg via ORAL
  Filled 2010-12-28: qty 2

## 2010-12-28 MED ORDER — ALPRAZOLAM 1 MG PO TABS
1.0000 mg | ORAL_TABLET | Freq: Two times a day (BID) | ORAL | Status: DC
  Administered 2010-12-29 (×2): 1 mg via ORAL
  Filled 2010-12-28 (×2): qty 1

## 2010-12-28 MED ORDER — ACETAMINOPHEN 325 MG PO TABS: 650.0000 mg | ORAL_TABLET | ORAL | Status: DC | PRN

## 2010-12-28 MED ORDER — SIMVASTATIN 20 MG PO TABS: 40.0000 mg | ORAL_TABLET | Freq: Every day | ORAL | Status: DC

## 2010-12-28 MED ORDER — ENOXAPARIN SODIUM 40 MG/0.4ML ~~LOC~~ SOLN
40.0000 mg | SUBCUTANEOUS | Status: DC
  Administered 2010-12-28: 40 mg via SUBCUTANEOUS
  Filled 2010-12-28: qty 0.4

## 2010-12-28 NOTE — ED Notes (Signed)
Dr. Strand at bedside. 

## 2010-12-28 NOTE — H&P (Signed)
PCP:   Kirk Ruths, MD   Chief Complaint:  Right sided weakness since yesterday  HPI: [name] is an 77 y.o. African American male.  History of coronary artery disease hypertension and stroke with residual dysphagia and mild right-sided weakness. Patient also has a history of dementia.  Ambulates with the assistance of a walker or cane and very occasionally without any assistance; occasionally has mechanical falls maybe once per week, yesterday fell 3 times and his wife noted that he was weak in the right upper extremity. Usually able to feed himself using his right hand and now cannot manipulate the arm and hand sufficiently to feed himself. He is having no swallowing difficulties; he has chronic speech impairment since his previous stroke.  No history of fever cough or cold no trauma. No headache nausea or vomiting.  She was evaluated in the emergency room in no acute changes have been found on head CT.  His wife reports that his blood pressure is usually well controlled, around 120/80, since being in the emergency room his blood pressure is remarkably elevated.Marland Kitchen  His wife and medication list indicates he takes Lopressor 50 mg daily; his daughter says he takes half a tablet 3 times daily.  His primary physician has added Zyprexa in the morning, tablet twice daily Haldol he received at his last discharge, and he is also taking twice daily Xanax   Rewiew of Systems:  The patient denies anorexia, fever, weight loss,, vision loss, decreased hearing, hoarseness, chest pain, syncope, dyspnea on exertion, peripheral edema, balance deficits, hemoptysis, abdominal pain, melena, hematochezia, severe indigestion/heartburn, hematuria, incontinence, genital sores, muscle weakness, suspicious skin lesions, transient blindness, depression, unusual weight change, abnormal bleeding, enlarged lymph nodes, angioedema, and breast masses.   Past Medical History  Diagnosis Date  . Stroke   .  Hypertension   . Coronary artery disease     Past Surgical History  Procedure Date  . Cardiac surgery; cardiac stents      Medications:  HOME MEDS: Prior to Admission medications   Medication Sig Start Date End Date Taking? Authorizing Provider  ALPRAZolam Prudy Feeler) 1 MG tablet Take 1 mg by mouth 2 (two) times daily.     Yes Historical Provider, MD  aspirin 81 MG tablet Take 162 mg by mouth daily.     Yes Historical Provider, MD  docusate sodium (COLACE) 100 MG capsule Take 100 mg by mouth 3 (three) times daily.     Yes Historical Provider, MD  haloperidol (HALDOL) 0.5 MG tablet Take 0.5 mg by mouth 2 (two) times daily.     Yes Historical Provider, MD  metoprolol (LOPRESSOR) 50 MG tablet Take 50 mg by mouth daily.     Yes Historical Provider, MD  OLANZapine (ZYPREXA) 5 MG tablet Take 5 mg by mouth every morning.     Yes Historical Provider, MD  omeprazole (PRILOSEC) 20 MG capsule Take 20 mg by mouth daily.     Yes Historical Provider, MD  ramipril (ALTACE) 10 MG tablet Take 10 mg by mouth daily.     Yes Historical Provider, MD  simvastatin (ZOCOR) 40 MG tablet Take 40 mg by mouth at bedtime.     Yes Historical Provider, MD     Allergies:  No Known Allergies  Social History:   reports that he has never smoked. He has never used smokeless tobacco. He reports that he does not drink alcohol or use illicit drugs.  Family History: History reviewed. No pertinent family history.  Physical Exam: Filed Vitals:   12/28/10 1830 12/28/10 2057 12/28/10 2133 12/28/10 2134  BP: 151/51 185/73  185/73  Pulse:  53  53  Temp:  97.6 F (36.4 C)  94.6 F (34.8 C)  TempSrc:    Oral  Resp: 13 16  16   Height:  5\' 2"  (1.575 m)  5\' 2"  (1.575 m)  Weight:  48.7 kg (107 lb 5.8 oz) 48.3 kg (106 lb 7.7 oz) 48.3 kg (106 lb 7.7 oz)  SpO2:  97%  97%   Blood pressure 185/73, pulse 53, temperature 94.6 F (34.8 C), temperature source Oral, resp. rate 16, height 5\' 2"  (1.575 m), weight 48.3 kg (106 lb  7.7 oz), SpO2 97.00%.  GEN:  Pleasantly confused elderly African American gentleman sitting up in the stretcher in no acute distress; cooperative with exam; just finished eating supper. PSYCH:  alert and oriented x1; does not appear anxious does not appear depressed; affect is normal HEENT: Mucous membranes pink and anicteric; PERRLA; EOM intact; multiple missing teeth; no cervical lymphadenopathy nor thyromegaly or carotid bruit; no JVD; no facial weakness Breasts:: Not examined CHEST WALL: No tenderness CHEST: Normal respiration, clear to auscultation bilaterally HEART: Bradycardic rate and normal rhythm; no murmurs rubs or gallops BACK: No kyphosis or scoliosis; no CVA tenderness ABDOMEN: Scaphoid soft non-tender; no masses, no organomegaly, normal abdominal bowel sounds;  Rectal Exam: Not done EXTREMITIES:  age-appropriate arthropathy of the hands and knees; no edema; no ulcerations. Right wrist drop noted Genitalia: not examined PULSES: 2+ and symmetric SKIN: Normal hydration no rash or ulceration CNS: Cranial nerves 2-12 grossly intact; 3-4/5 weakness of the right upper extremity poor grip in the right hand. 5 over 5 power in the left upper extremity. Not cooperating well with the examination of the lower extremity, but deep tendon reflexes seem equal, since he is downgoing bilaterally, there was no clonus, gait was not tested.   Labs & Imaging Results for orders placed during the hospital encounter of 12/28/10 (from the past 48 hour(s))  CBC     Status: Abnormal   Collection Time   12/28/10  4:45 PM      Component Value Range Comment   WBC 5.9  4.0 - 10.5 (K/uL)    RBC 3.65 (*) 4.22 - 5.81 (MIL/uL)    Hemoglobin 12.0 (*) 13.0 - 17.0 (g/dL)    HCT 09.8 (*) 11.9 - 52.0 (%)    MCV 98.9  78.0 - 100.0 (fL)    MCH 32.9  26.0 - 34.0 (pg)    MCHC 33.2  30.0 - 36.0 (g/dL)    RDW 14.7  82.9 - 56.2 (%)    Platelets 120 (*) 150 - 400 (K/uL)   DIFFERENTIAL     Status: Abnormal    Collection Time   12/28/10  4:45 PM      Component Value Range Comment   Neutrophils Relative 56  43 - 77 (%)    Neutro Abs 3.3  1.7 - 7.7 (K/uL)    Lymphocytes Relative 24  12 - 46 (%)    Lymphs Abs 1.4  0.7 - 4.0 (K/uL)    Monocytes Relative 19 (*) 3 - 12 (%)    Monocytes Absolute 1.1 (*) 0.1 - 1.0 (K/uL)    Eosinophils Relative 1  0 - 5 (%)    Eosinophils Absolute 0.1  0.0 - 0.7 (K/uL)    Basophils Relative 1  0 - 1 (%)    Basophils Absolute 0.0  0.0 - 0.1 (  K/uL)   BASIC METABOLIC PANEL     Status: Abnormal   Collection Time   12/28/10  4:45 PM      Component Value Range Comment   Sodium 142  135 - 145 (mEq/L)    Potassium 3.4 (*) 3.5 - 5.1 (mEq/L)    Chloride 105  96 - 112 (mEq/L)    CO2 31  19 - 32 (mEq/L)    Glucose, Bld 74  70 - 99 (mg/dL)    BUN 19  6 - 23 (mg/dL)    Creatinine, Ser 4.09  0.50 - 1.35 (mg/dL)    Calcium 9.6  8.4 - 10.5 (mg/dL)    GFR calc non Af Amer 76 (*) >90 (mL/min)    GFR calc Af Amer 89 (*) >90 (mL/min)   CARDIAC PANEL(CRET KIN+CKTOT+MB+TROPI)     Status: Normal   Collection Time   12/28/10  4:45 PM      Component Value Range Comment   Total CK 80  7 - 232 (U/L)    CK, MB 1.8  0.3 - 4.0 (ng/mL)    Troponin I <0.30  <0.30 (ng/mL)    Relative Index RELATIVE INDEX IS INVALID  0.0 - 2.5    URINALYSIS, ROUTINE W REFLEX MICROSCOPIC     Status: Abnormal   Collection Time   12/28/10  5:28 PM      Component Value Range Comment   Color, Urine YELLOW  YELLOW     APPearance HAZY (*) CLEAR     Specific Gravity, Urine >1.030 (*) 1.005 - 1.030     pH 6.0  5.0 - 8.0     Glucose, UA NEGATIVE  NEGATIVE (mg/dL)    Hgb urine dipstick NEGATIVE  NEGATIVE     Bilirubin Urine NEGATIVE  NEGATIVE     Ketones, ur NEGATIVE  NEGATIVE (mg/dL)    Protein, ur TRACE (*) NEGATIVE (mg/dL)    Urobilinogen, UA 0.2  0.0 - 1.0 (mg/dL)    Nitrite NEGATIVE  NEGATIVE     Leukocytes, UA NEGATIVE  NEGATIVE    URINE MICROSCOPIC-ADD ON     Status: Normal   Collection Time    12/28/10  5:28 PM      Component Value Range Comment   Squamous Epithelial / LPF RARE  RARE     WBC, UA 0-2  <3 (WBC/hpf)    RBC / HPF 0-2  <3 (RBC/hpf)    Bacteria, UA RARE  RARE     Urine-Other MUCOUS PRESENT      Dg Pelvis 1-2 Views  12/28/2010  *RADIOLOGY REPORT*  Clinical Data: Multiple falls, bilateral hip tenderness  PELVIS - 1-2 VIEW  Comparison: CT abdomen pelvis dated 09/03/2010  Findings: No fracture or dislocation is seen.  Bilateral hip joint spaces are symmetric.  Mild degenerative changes of the lower lumbar spine.  IMPRESSION: No fracture or dislocation is seen.  Original Report Authenticated By: Charline Bills, M.D.   Ct Head Wo Contrast  12/28/2010  *RADIOLOGY REPORT*  Clinical Data: Fall, right hand numbness, leg weakness, history stroke, hypertension, coronary artery disease  CT HEAD WITHOUT CONTRAST  Technique:  Contiguous axial images were obtained from the base of the skull through the vertex without contrast.  Comparison: 03/13/2010  Findings: Generalized atrophy. Normal ventricular morphology. No midline shift or mass effect. Small vessel chronic ischemic changes of deep cerebral white matter. No intracranial hemorrhage, mass lesion, or acute infarction. Visualized paranasal sinuses and mastoid air cells clear. Bones unremarkable.  IMPRESSION: Atrophy with  small vessel chronic ischemic changes of deep cerebral white matter. No acute intracranial abnormalities.  Original Report Authenticated By: Lollie Marrow, M.D.      Assessment Present on Admission:  .RUE weakness .Gait abnormality Probable acute stroke  recurent falls .Dementia .HTN (hypertension) uncontrolled .CAD (coronary artery disease) .Bradycardia    PLAN: Will admit this gentleman on the stroke protocol for hydration and will control his blood pressure; MR I. evaluation of the brain in the morning.  His multiple psychotropic medications may be contributing to his recurrent falls; will reduce  the dose of his Haldol and Zyprexa; he may need additional Haldol in the hospital if he becomes agitated. Her Avelox and to reduce his Xanax right away since he's been on it for so long but it may be contributing to his chronic recurrent falls.  Will continue his other chronic medications  Other plans as per orders.   Kelina Beauchamp 12/28/2010, 10:12 PM

## 2010-12-28 NOTE — ED Provider Notes (Signed)
History   Scribed for [employer]. Colon Branch, MD, the patient was seen in room APA01/APA01 . This chart was scribed by Lewanda Rife.   CSN: [id number] Arrival date & time: 12/28/2010  3:57 PM   First MD Initiated Contact with Patient 12/28/10 1613      Chief Complaint  Patient presents with  . Fall    (Consider location/radiation/quality/duration/timing/severity/associated sxs/prior Treatment)  Level 5 Caveat applies due to dementia.  HPI [name] is a 77 y.o. malewith a h/o CAD, HTN, dementia, CVA with mild right sided ewakness, who presents to the Emergency Department complaining of a fall today. Hx was provided by relative. Pt has a hx of a stroke with residual right sided weakness. Pt is supposed to use a walker, but relative reports he seldomly remembers to use it all the time. Patient got out of bed  And fell backward to the floor. He was found there by a relative who assisted him up. Patient was unable to stand or walk without significant assistance following the fall. There was no LOC.   PCP Shaune Spittle  Past Medical History  Diagnosis Date  . Stroke   . Hypertension   . Coronary artery disease     Past Surgical History  Procedure Date  . Cardiac surgery     History reviewed. No pertinent family history.  History  Substance Use Topics  . Smoking status: Never Smoker   . Smokeless tobacco: Not on file  . Alcohol Use: No      Review of Systems  Unable to perform ROS: Dementia  Neurological: Positive for weakness (right-sided weakness due to stroke).    Allergies  Review of patient's allergies indicates no known allergies.  Home Medications   Current Outpatient Rx  Name Route Sig Dispense Refill  . ALPRAZOLAM 1 MG PO TABS Oral Take 1 mg by mouth 2 (two) times daily.      . ASPIRIN 81 MG PO TABS Oral Take 162 mg by mouth daily.      Marland Kitchen DOCUSATE SODIUM 100 MG PO CAPS Oral Take 100 mg by mouth 3 (three) times daily.      Marland Kitchen METOPROLOL TARTRATE 50 MG  PO TABS Oral Take 50 mg by mouth daily.      Marland Kitchen OMEPRAZOLE 20 MG PO CPDR Oral Take 20 mg by mouth daily.      Marland Kitchen RAMIPRIL 10 MG PO TABS Oral Take 10 mg by mouth daily.      Marland Kitchen SIMVASTATIN 40 MG PO TABS Oral Take 40 mg by mouth at bedtime.       Triage VS  BP 129/92  Pulse 50  Temp(Src) 97.5 F (36.4 C) (Oral)  Resp 18  Ht 5\' 2"  (1.575 m)  Wt 108 lb (48.988 kg)  BMI 19.75 kg/m2  SpO2 100%  Physical Exam  Nursing note and vitals reviewed. Constitutional: He appears well-developed and well-nourished.  HENT:  Head: Normocephalic and atraumatic.  Right Ear: External ear normal.  Left Ear: External ear normal.  Mouth/Throat: Oropharynx is clear and moist.  Eyes: EOM are normal. Pupils are equal, round, and reactive to light.  Neck: Normal range of motion. Neck supple.       Subcutaneous large cystic in nature noted directly over the thyroid 4 by 5 cm The mass is soft and movable Possible lipoma or thyroid cyst  Cardiovascular: Regular rhythm.  Bradycardia present.   Pulmonary/Chest: Effort normal. He has no wheezes. He has rales (Mild rales bilaterally and clears  with cough).  Abdominal: Soft. Bowel sounds are normal.  Musculoskeletal: Normal range of motion. He exhibits no edema.       FROM shoulders, elbows, wrists, knees, hips  Neurological: He is alert. He has normal reflexes.       Oriented to person. Mild right sided weakness compared with left side. Patient is left handed.  Skin: Skin is warm and dry.       No bruising or abrasions noted.  Psychiatric: He has a normal mood and affect. His behavior is normal.    ED Course  Procedures (including critical care time) Oxygen saturation is 100% on room air, normal by my interpretation.   6:04 PM Asked about pts assistance required to ambulate at home. Pts family reports the necessity to use a walker. Discussed unremarkable lab results and no acute findings with head CT to family members. Family reports she would be more  comfortable if pt is admitted over night for observation because pt had fallen 3x (3 falls just reported at this time) yesterday with no complaints of pain. Pt will not bear weight. During admission pt could possibly have an MRI done for further evaluation.  6:34 PM Consulted with Dr. Sherrie Mustache about admission for pt. Dr. Sherrie Mustache wants to add cardiac enzymes to pts workup for admission. Will do temporary admission orders.    Results for orders placed during the hospital encounter of 12/28/10  CBC      Component Value Range   WBC 5.9  4.0 - 10.5 (K/uL)   RBC 3.65 (*) 4.22 - 5.81 (MIL/uL)   Hemoglobin 12.0 (*) 13.0 - 17.0 (g/dL)   HCT 54.0 (*) 98.1 - 52.0 (%)   MCV 98.9  78.0 - 100.0 (fL)   MCH 32.9  26.0 - 34.0 (pg)   MCHC 33.2  30.0 - 36.0 (g/dL)   RDW 19.1  47.8 - 29.5 (%)   Platelets 120 (*) 150 - 400 (K/uL)  DIFFERENTIAL      Component Value Range   Neutrophils Relative 56  43 - 77 (%)   Neutro Abs 3.3  1.7 - 7.7 (K/uL)   Lymphocytes Relative 24  12 - 46 (%)   Lymphs Abs 1.4  0.7 - 4.0 (K/uL)   Monocytes Relative 19 (*) 3 - 12 (%)   Monocytes Absolute 1.1 (*) 0.1 - 1.0 (K/uL)   Eosinophils Relative 1  0 - 5 (%)   Eosinophils Absolute 0.1  0.0 - 0.7 (K/uL)   Basophils Relative 1  0 - 1 (%)   Basophils Absolute 0.0  0.0 - 0.1 (K/uL)  BASIC METABOLIC PANEL      Component Value Range   Sodium 142  135 - 145 (mEq/L)   Potassium 3.4 (*) 3.5 - 5.1 (mEq/L)   Chloride 105  96 - 112 (mEq/L)   CO2 31  19 - 32 (mEq/L)   Glucose, Bld 74  70 - 99 (mg/dL)   BUN 19  6 - 23 (mg/dL)   Creatinine, Ser 6.21  0.50 - 1.35 (mg/dL)   Calcium 9.6  8.4 - 30.8 (mg/dL)   GFR calc non Af Amer 76 (*) >90 (mL/min)   GFR calc Af Amer 89 (*) >90 (mL/min)  URINALYSIS, ROUTINE W REFLEX MICROSCOPIC      Component Value Range   Color, Urine YELLOW  YELLOW    APPearance HAZY (*) CLEAR    Specific Gravity, Urine >1.030 (*) 1.005 - 1.030    pH 6.0  5.0 - 8.0  Glucose, UA NEGATIVE  NEGATIVE (mg/dL)   Hgb  urine dipstick NEGATIVE  NEGATIVE    Bilirubin Urine NEGATIVE  NEGATIVE    Ketones, ur NEGATIVE  NEGATIVE (mg/dL)   Protein, ur TRACE (*) NEGATIVE (mg/dL)   Urobilinogen, UA 0.2  0.0 - 1.0 (mg/dL)   Nitrite NEGATIVE  NEGATIVE    Leukocytes, UA NEGATIVE  NEGATIVE   URINE MICROSCOPIC-ADD ON      Component Value Range   Squamous Epithelial / LPF RARE  RARE    WBC, UA 0-2  <3 (WBC/hpf)   RBC / HPF 0-2  <3 (RBC/hpf)   Bacteria, UA RARE  RARE    Urine-Other MUCOUS PRESENT     Dg Pelvis 1-2 Views  12/28/2010  *RADIOLOGY REPORT*  Clinical Data: Multiple falls, bilateral hip tenderness  PELVIS - 1-2 VIEW  Comparison: CT abdomen pelvis dated 09/03/2010  Findings: No fracture or dislocation is seen.  Bilateral hip joint spaces are symmetric.  Mild degenerative changes of the lower lumbar spine.  IMPRESSION: No fracture or dislocation is seen.  Original Report Authenticated By: Charline Bills, M.D.   Ct Head Wo Contrast  12/28/2010  *RADIOLOGY REPORT*  Clinical Data: Fall, right hand numbness, leg weakness, history stroke, hypertension, coronary artery disease  CT HEAD WITHOUT CONTRAST  Technique:  Contiguous axial images were obtained from the base of the skull through the vertex without contrast.  Comparison: 03/13/2010  Findings: Generalized atrophy. Normal ventricular morphology. No midline shift or mass effect. Small vessel chronic ischemic changes of deep cerebral white matter. No intracranial hemorrhage, mass lesion, or acute infarction. Visualized paranasal sinuses and mastoid air cells clear. Bones unremarkable.  IMPRESSION: Atrophy with small vessel chronic ischemic changes of deep cerebral white matter. No acute intracranial abnormalities.  Original Report Authenticated By: Lollie Marrow, M.D.     Date: 12/28/2010 1621  Rate:43  Rhythm: sinus bradycardia  QRS Axis: normal  Intervals: normal  ST/T Wave abnormalities: normal  Conduction Disutrbances:none  Narrative Interpretation:    Old EKG Reviewed: changes noted c/w 09/04/10 rate slower, no PVCs      MDM  Patient with h/o stroke in past with right sided weakness that usually walks using a walker. Has fallen 4 times since yesterday. Wife noticed slight increase in right hand weakness. Labs are unremarkable. CT head negative for acute process. Patient was not orthostatic however he was unable to stand on his own with both legs buckling. After consultation with family, they requested admission. Spoke with Dr. Sherrie Mustache, hospitalist, who will admit to telemetry. Will obtain pelvic xray to r/o fracture from falls and add cardiac markers to labs.Pt stable in ED with no significant deterioration in condition.The patient appears reasonably stabilized for admission considering the current resources, flow, and capabilities available in the ED at this time, and I doubt any other [facility] requiring further screening and/or treatment in the ED prior to admission.  I personally performed the services described in this documentation, which was scribed in my presence. The recorded information has been reviewed and considered.  MDM Reviewed: previous chart, nursing note and vitals Interpretation: labs, ECG, x-ray and CT scan Total time providing critical care: 40. Consults: hospitalist.       Nicoletta Dress. Colon Branch, MD 12/29/10 [phone]

## 2010-12-28 NOTE — ED Notes (Addendum)
Patient reports fall today. States feeling weak on right side today. Right grip moderate, left grip strong. No facial droop noted. Plantar and dorsal flexion strong and equal bilaterally. Pupils PERRL. Denies any pain. No chest pain. Denies numbness in right hand now, but was experiencing numbness earlier today.

## 2010-12-28 NOTE — ED Notes (Signed)
Pt c/o fall at 1230. States that his right hand is numb. Able to pick up both legs. Right hand grip moderate and left hand grip strong. Pt was unable to stand after fall per family.

## 2010-12-28 NOTE — ED Notes (Signed)
Upon standing for orthostatic vital signs, patient leaning heavily on RN and bed unable to stand completely.  Patient unable to ambulate at all due to weakness. Pt denies dizziness upon standing.

## 2010-12-29 ENCOUNTER — Encounter (HOSPITAL_COMMUNITY): Payer: Self-pay | Admitting: Internal Medicine

## 2010-12-29 ENCOUNTER — Inpatient Hospital Stay (HOSPITAL_COMMUNITY): Payer: Medicare Other

## 2010-12-29 DIAGNOSIS — F0392 Unspecified dementia, unspecified severity, with psychotic disturbance: Secondary | ICD-10-CM

## 2010-12-29 DIAGNOSIS — D649 Anemia, unspecified: Secondary | ICD-10-CM

## 2010-12-29 DIAGNOSIS — R296 Repeated falls: Secondary | ICD-10-CM

## 2010-12-29 DIAGNOSIS — I663 Occlusion and stenosis of cerebellar arteries: Secondary | ICD-10-CM

## 2010-12-29 DIAGNOSIS — E876 Hypokalemia: Secondary | ICD-10-CM | POA: Diagnosis present

## 2010-12-29 DIAGNOSIS — F05 Delirium due to known physiological condition: Secondary | ICD-10-CM | POA: Diagnosis present

## 2010-12-29 DIAGNOSIS — F0390 Unspecified dementia without behavioral disturbance: Secondary | ICD-10-CM

## 2010-12-29 DIAGNOSIS — I319 Disease of pericardium, unspecified: Secondary | ICD-10-CM

## 2010-12-29 DIAGNOSIS — I6629 Occlusion and stenosis of unspecified posterior cerebral artery: Secondary | ICD-10-CM

## 2010-12-29 DIAGNOSIS — D696 Thrombocytopenia, unspecified: Secondary | ICD-10-CM

## 2010-12-29 DIAGNOSIS — I651 Occlusion and stenosis of basilar artery: Secondary | ICD-10-CM

## 2010-12-29 HISTORY — DX: Occlusion and stenosis of cerebellar arteries: I66.3

## 2010-12-29 HISTORY — DX: Occlusion and stenosis of basilar artery: I65.1

## 2010-12-29 HISTORY — DX: Anemia, unspecified: D64.9

## 2010-12-29 HISTORY — DX: Repeated falls: R29.6

## 2010-12-29 HISTORY — DX: Occlusion and stenosis of unspecified posterior cerebral artery: I66.29

## 2010-12-29 HISTORY — DX: Thrombocytopenia, unspecified: D69.6

## 2010-12-29 HISTORY — DX: Unspecified dementia without behavioral disturbance: F03.90

## 2010-12-29 HISTORY — DX: Unspecified dementia, unspecified severity, with psychotic disturbance: F03.92

## 2010-12-29 LAB — CBC
HCT: 39.2 % (ref 39.0–52.0)
MCH: 31.8 pg (ref 26.0–34.0)
MCV: 97.5 fL (ref 78.0–100.0)
Platelets: 105 10*3/uL — ABNORMAL LOW (ref 150–400)
RDW: 12.3 % (ref 11.5–15.5)

## 2010-12-29 LAB — T4, FREE: Free T4: 0.95 ng/dL (ref 0.80–1.80)

## 2010-12-29 LAB — LIPID PANEL
LDL Cholesterol: 85 mg/dL (ref 0–99)
Total CHOL/HDL Ratio: 2.2 RATIO
VLDL: 15 mg/dL (ref 0–40)

## 2010-12-29 LAB — HEPATIC FUNCTION PANEL
ALT: 40 U/L (ref 0–53)
AST: 31 U/L (ref 0–37)
Alkaline Phosphatase: 84 U/L (ref 39–117)
Bilirubin, Direct: <0.1 mg/dL (ref 0.0–0.3)
Total Bilirubin: 0.2 mg/dL — ABNORMAL LOW (ref 0.3–1.2)

## 2010-12-29 LAB — IRON AND TIBC
Iron: 80 ug/dL (ref 42–135)
Saturation Ratios: 32 % (ref 20–55)
TIBC: 253 ug/dL (ref 215–435)

## 2010-12-29 LAB — BASIC METABOLIC PANEL
BUN: 13 mg/dL (ref 6–23)
CO2: 29 mEq/L (ref 19–32)
Calcium: 9.9 mg/dL (ref 8.4–10.5)
Chloride: 103 mEq/L (ref 96–112)
Creatinine, Ser: 0.83 mg/dL (ref 0.50–1.35)
Glucose, Bld: 83 mg/dL (ref 70–99)

## 2010-12-29 LAB — VITAMIN B12: Vitamin B-12: 640 pg/mL (ref 211–911)

## 2010-12-29 MED ORDER — LORAZEPAM 2 MG/ML IJ SOLN
0.5000 mg | Freq: Once | INTRAMUSCULAR | Status: AC
  Administered 2010-12-29: 0.5 mg via INTRAVENOUS
  Filled 2010-12-29: qty 1

## 2010-12-29 MED ORDER — LORAZEPAM 2 MG/ML IJ SOLN
0.5000 mg | Freq: Two times a day (BID) | INTRAMUSCULAR | Status: DC
  Administered 2010-12-29 – 2010-12-31 (×4): 0.5 mg via INTRAVENOUS
  Filled 2010-12-29 (×5): qty 1

## 2010-12-29 MED ORDER — POTASSIUM CHLORIDE CRYS ER 20 MEQ PO TBCR
20.0000 meq | EXTENDED_RELEASE_TABLET | Freq: Two times a day (BID) | ORAL | Status: AC
  Administered 2010-12-29: 20 meq via ORAL
  Filled 2010-12-29: qty 1

## 2010-12-29 MED ORDER — HALOPERIDOL LACTATE 5 MG/ML IJ SOLN
2.5000 mg | Freq: Four times a day (QID) | INTRAMUSCULAR | Status: DC | PRN
  Administered 2010-12-29 – 2011-01-02 (×7): 2.5 mg via INTRAVENOUS
  Administered 2011-01-03: 20:00:00 via INTRAVENOUS
  Administered 2011-01-03: 2.5 mg via INTRAVENOUS
  Filled 2010-12-29 (×10): qty 1

## 2010-12-29 MED ORDER — METOPROLOL SUCCINATE ER 50 MG PO TB24
50.0000 mg | ORAL_TABLET | Freq: Every day | ORAL | Status: DC
  Administered 2010-12-29: 50 mg via ORAL

## 2010-12-29 MED ORDER — METOPROLOL TARTRATE 1 MG/ML IV SOLN
5.0000 mg | Freq: Four times a day (QID) | INTRAVENOUS | Status: DC | PRN
  Administered 2010-12-30: 5 mg via INTRAVENOUS
  Filled 2010-12-29: qty 5

## 2010-12-29 MED ORDER — SODIUM CHLORIDE 0.9 % IJ SOLN
INTRAMUSCULAR | Status: AC
  Administered 2010-12-30: 01:00:00
  Filled 2010-12-29: qty 3

## 2010-12-29 NOTE — Progress Notes (Signed)
Physical Therapy Evaluation Patient Details Name: [name] MRN: [id number] DOB: 11-14-1929 Today's Date: 12/29/2010  Problem List:  Patient Active Problem List  Diagnoses  . Fever  . Abdominal pain, generalized  . CVA, old, speech/language deficit  . HTN (hypertension)  . CAD (coronary artery disease)  . Dementia  . Bradycardia  . RUE weakness  . Gait abnormality  . Frequent falls  . Anemia  . Hypokalemia  . Thrombocytopenia  . Senile dementia with delirium    Past Medical History:  Past Medical History  Diagnosis Date  . Stroke   . Hypertension   . Coronary artery disease   . Frequent falls 12/29/2010  . Dementia   . Anemia 12/29/2010  . Thrombocytopenia 12/29/2010  . Senile dementia with delirium 12/29/2010   Past Surgical History:  Past Surgical History  Procedure Date  . Cardiac surgery     PT Assessment/Plan/Recommendation PT Assessment Clinical Impression Statement: very pleasant but demented pt who is able to follow simple directions but has no understanding of his situation..per brother, pt had needed assist with all ADLs PTA , but was ambulating independently with walker at home...primary dysfunction today is that of profound weakness of R wrist and fingers, poor sitting balance and inability to stand of walk...he will need SNF at d/c.Marland KitchenMarland Kitchenwe will follow to try to maximize functional ability  PT Recommendation/Assessment: Patient will need skilled PT in the acute care venue PT Problem List: Decreased strength;Decreased balance;Decreased mobility;Decreased coordination;Decreased cognition;Decreased safety awareness Barriers to Discharge: None PT Therapy Diagnosis : Hemiplegia non-dominant side PT Plan PT Frequency: Min 4X/week PT Recommendation Follow Up Recommendations: Skilled nursing facility Equipment Recommended: Defer to next venue PT Goals  Acute Rehab PT Goals PT Goal Formulation: With patient/family Time For Goal Achievement: 2 weeks Pt  will go Supine/Side to Sit: with modified independence;with HOB 0 degrees Pt will Sit at [facility] of Bed: with mod assist;3-5 min;with no upper extremity support Pt will go Sit to Stand: with max assist  PT Evaluation Precautions/Restrictions  Precautions Precautions: Fall Required Braces or Orthoses: No Restrictions Weight Bearing Restrictions: No Prior Functioning  Home Living Lives With: Spouse Receives Help From: Family Type of Home: House Home Layout: One level Home Access: Stairs to enter Entrance Stairs-Rails: None Entrance Stairs-Number of Steps: 2 Home Adaptive Equipment: Walker - rolling Prior Function Level of Independence: Needs assistance with ADLs;Requires assistive device for independence;Independent with gait;Independent with transfers Cognition Cognition Arousal/Alertness: Awake/alert Overall Cognitive Status: History of cognitive impairments History of Cognitive Impairment: Appears at baseline functioning Orientation Level: Oriented to person Sensation/Coordination Sensation Light Touch: Appears Intact Coordination Gross Motor Movements are Fluid and Coordinated: No Fine Motor Movements are Fluid and Coordinated: No Coordination and Movement Description: pt is tremulous with any activity Finger Nose Finger Test: because of wrist and finger weakness, did not touch nose immediately, but knew where it was Heel Shin Test: unable to maintain L heel on R shin...RLE very tremulous Extremity Assessment RUE Assessment RUE Assessment: Exceptions to [facility] RUE Strength RUE Overall Strength Comments: strength at shoulder and elbow is WNL, but wrist and finger strength is 1/5.Marland KitchenMarland Kitchenpt is L dominant LUE Assessment LUE Assessment: Within Functional Limits RLE Assessment RLE Assessment: Within Functional Limits LLE Assessment LLE Assessment: Exceptions to [facility] LLE Strength LLE Overall Strength Comments: generally 3/5 Mobility (including Balance) Bed Mobility Bed Mobility:  Yes Rolling Left: 5: Supervision Left Sidelying to Sit: 4: Min assist Sitting - Scoot to Edge of Bed: 1: +1 Total assist Sit to  Supine - Left: 5: Supervision Transfers Transfers: Yes Sit to Stand: 2: Max assist Stand to Sit: 2: Max assist  Posture/Postural Control Posture/Postural Control: No significant limitations Balance Balance Assessed: Yes Static Sitting Balance Static Sitting - Level of Assistance: 3: Mod assist (falls backward) Dynamic Sitting Balance Dynamic Sitting - Level of Assistance: 2: Max assist Static Standing Balance Static Standing - Level of Assistance: 1: +1 Total assist (unable to weight bear on RLE) Exercise    End of Session PT - End of Session Equipment Utilized During Treatment: Gait belt Activity Tolerance: Patient tolerated treatment well Patient left: in bed;with call bell in reach;with bed alarm set;with family/visitor present Nurse Communication: Mobility status for transfers General Behavior During Session: [facility] for tasks performed Cognition: Impaired, at baseline  Myrlene Broker L 12/29/2010, 10:37 AM

## 2010-12-29 NOTE — Progress Notes (Signed)
Subjective: The patient became more confused overnight. He had to be given extra Haldol. A sister has been ordered. He is currently alert and sitting up in bed. The sitter is feeding him breakfast. He wants the protective gloves off, otherwise he has no complaints.  Objective: Vital signs in last 24 hours: Filed Vitals:   12/28/10 2133 12/28/10 2134 12/28/10 2300 12/29/10 0220  BP:  185/73  160/77  Pulse:  53  71  Temp:  94.6 F (34.8 C) 94.6 F (34.8 C) 97.8 F (36.6 C)  TempSrc:  Oral  Oral  Resp:  16  20  Height:  5\' 2"  (1.575 m)    Weight: 48.3 kg (106 lb 7.7 oz) 48.3 kg (106 lb 7.7 oz) 48.3 kg (106 lb 7.7 oz)   SpO2:  97%  94%    Intake/Output Summary (Last 24 hours) at 12/29/10 0943 Last data filed at 12/29/10 0900  Gross per 24 hour  Intake     60 ml  Output    700 ml  Net   -640 ml    Weight change:   Physical exam: General: Alert, confused but pleasant, 77 year old African American man sitting up in bed in no acute distress. Lungs: Clear anteriorly with decreased breath sounds in the bases. Heart: S1, S2, no murmurs rubs or gallops. Abdomen: Positive bowel sounds, soft, nontender, nondistended. Extremities: No pedal edema. Neurologic: The patient is alert. He is oriented to himself. He believes he is at home. His speech is clear however he is not oriented. No overt cranial nerve deficits noted. He does not understand the testing of strength. He is, however, moving both of his upper extremities and lower extremities without provocation.  Lab Results: Basic Metabolic Panel:  Basename 12/29/10 0530 12/28/10 2300 12/28/10 1645  NA 141 -- 142  K 3.6 -- 3.4*  CL 103 -- 105  CO2 29 -- 31  GLUCOSE 83 -- 74  BUN 13 -- 19  CREATININE 0.83 -- 0.97  CALCIUM 9.9 -- 9.6  MG -- 1.9 --  PHOS -- -- --   Liver Function Tests:  Basename 12/28/10 2300  AST 31  ALT 40  ALKPHOS 84  BILITOT 0.2*  PROT 7.1  ALBUMIN 3.4*   No results found for this basename:  LIPASE:2,AMYLASE:2 in the last 72 hours No results found for this basename: AMMONIA:2 in the last 72 hours CBC:  Basename 12/29/10 0530 12/28/10 1645  WBC 7.1 5.9  NEUTROABS -- 3.3  HGB 12.8* 12.0*  HCT 39.2 36.1*  MCV 97.5 98.9  PLT 105* 120*   Cardiac Enzymes:  Basename 12/28/10 1645  CKTOTAL 80  CKMB 1.8  CKMBINDEX --  TROPONINI <0.30   BNP: No components found with this basename: POCBNP:3 D-Dimer: No results found for this basename: DDIMER:2 in the last 72 hours CBG: No results found for this basename: GLUCAP:6 in the last 72 hours Hemoglobin A1C: No results found for this basename: HGBA1C in the last 72 hours Fasting Lipid Panel:  Basename 12/29/10 0530  CHOL 183  HDL 83  LDLCALC 85  TRIG 76  CHOLHDL 2.2  LDLDIRECT --   Thyroid Function Tests: No results found for this basename: TSH,T4TOTAL,FREET4,T3FREE,THYROIDAB in the last 72 hours Anemia Panel: No results found for this basename: VITAMINB12,FOLATE,FERRITIN,TIBC,IRON,RETICCTPCT in the last 72 hours Coagulation: No results found for this basename: LABPROT:2,INR:2 in the last 72 hours Urine Drug Screen: Drugs of Abuse  No results found for this basename: labopia, cocainscrnur, labbenz, amphetmu, thcu, labbarb  Alcohol Level: No results found for this basename: ETH:2 in the last 72 hours   Micro: No results found for this or any previous visit (from the past 240 hour(s)).  Studies/Results: Dg Pelvis 1-2 Views  12/28/2010  *RADIOLOGY REPORT*  Clinical Data: Multiple falls, bilateral hip tenderness  PELVIS - 1-2 VIEW  Comparison: CT abdomen pelvis dated 09/03/2010  Findings: No fracture or dislocation is seen.  Bilateral hip joint spaces are symmetric.  Mild degenerative changes of the lower lumbar spine.  IMPRESSION: No fracture or dislocation is seen.  Original Report Authenticated By: Charline Bills, M.D.   Ct Head Wo Contrast  12/28/2010  *RADIOLOGY REPORT*  Clinical Data: Fall, right hand  numbness, leg weakness, history stroke, hypertension, coronary artery disease  CT HEAD WITHOUT CONTRAST  Technique:  Contiguous axial images were obtained from the base of the skull through the vertex without contrast.  Comparison: 03/13/2010  Findings: Generalized atrophy. Normal ventricular morphology. No midline shift or mass effect. Small vessel chronic ischemic changes of deep cerebral white matter. No intracranial hemorrhage, mass lesion, or acute infarction. Visualized paranasal sinuses and mastoid air cells clear. Bones unremarkable.  IMPRESSION: Atrophy with small vessel chronic ischemic changes of deep cerebral white matter. No acute intracranial abnormalities.  Original Report Authenticated By: Lollie Marrow, M.D.    Medications: I have reviewed the patient's current medications.  Assessment: Active Problems:  HTN (hypertension)  CAD (coronary artery disease)  Dementia  Bradycardia  RUE weakness  Gait abnormality  Frequent falls  Anemia  Hypokalemia  Thrombocytopenia  Senile dementia with delirium  1. Frequent falls at home/gait abnormality and concomitant right upper extremity weakness. MRI/MRA of the brain, 2-D echocardiogram, and carotid ultrasound are pending for workup of a possible stroke.  Dementia with delirium. The patient is on several psychotropic medications for dementia with agitation including Xanax, Haldol, and Zyprexa. Drug-induced gait abnormality and weakness are considerations.  Thrombocytopenia of unknown etiology. We'll hold aspirin and discontinue Lovenox. His vitamin B 12 and folate will be assessed.  Mild anemia. An anemia panel will be ordered.  Transient bradycardia. His thyroid function will be assessed.  Hypokalemia, repleted.  Coronary artery disease/hypertension. He is treated chronically with ramipril, metoprolol, simvastatin. We'll decrease the rate of the IV fluids which may be contributing to his more elevated blood pressure. Short acting  metoprolol was changed to Toprol-XL in hopes that it will not decrease his heart rate too much. We'll add parameters.   Plan:  1. As above, will check the results of the studies pending. Will order an anemia panel to assess his anemia and thrombocytopenia.  We'll give Ativan IV prior to MRI because it is likely that the patient will not corporative with the studies.  Decrease IV fluids to 65 cc an hour.  Continue sitter for safety.  PT/OT consults pending.     LOS: 1 day   Maximina Pirozzi 12/29/2010, 9:43 AM

## 2010-12-29 NOTE — Progress Notes (Signed)
*  PRELIMINARY RESULTS* Echocardiogram 2D Echocardiogram has been performed.  Conrad Hastings 12/29/2010, 11:47 AM

## 2010-12-29 NOTE — Progress Notes (Signed)
Speech Language/Pathology Clinical/Bedside Swallow Evaluation Patient Details  Name: [name] MRN: [id number] DOB: 1929/11/25 Today's Date: 12/29/2010  Past Medical History:  Past Medical History  Diagnosis Date  . Stroke   . Hypertension   . Coronary artery disease   . Frequent falls 12/29/2010  . Dementia   . Anemia 12/29/2010  . Thrombocytopenia 12/29/2010  . Senile dementia with delirium 12/29/2010  . 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 Cerebellar artery occlusion or stenosis 12/29/2010  . Basilar artery stenosis 12/29/2010   Past Surgical History:  Past Surgical History  Procedure Date  . Cardiac surgery     Assessment/Recommendations/Treatment Plan    SLP Assessment Clinical Impression Statement: s/s aspiration with all consistencies tested Risk for Aspiration: Severe Other Related Risk Factors: Cognitive impairment  Recommendations General Recommendation: NPO Oral Care Recommendations: Oral care QID;Staff/trained caregiver to provide oral care Other Recommendations:  (reassess in a.m.) Follow up Recommendations: Other (comment) (reassess in the a.m.)  Treatment Plan Treatment Plan Recommendations:  (NPO at this time, ST to reassess in the morning)  Prognosis Prognosis for Safe Diet Advancement: Guarded Barriers to Reach Goals: Cognitive deficits  Individuals Consulted Consulted and Agree with Results and Recommendations: Patient;Family member/caregiver;RN;MD   General  Date of Onset: 12/29/10 HPI (Other Pertinent Information): swallow issues began this date at lunch Type of Study: Bedside swallow evaluation Diet Prior to this Study: Regular Temperature Spikes Noted: No Respiratory Status: Room air Behavior/Cognition: Alert;Cooperative;Distractible;Requires cueing Patient Positioning: Upright in chair Baseline Vocal Quality: Normal Volitional Cough: Strong Volitional Swallow: Able to  elicit  Oral Motor/Sensory Function  Overall Oral Motor/Sensory Function: Appears within functional limits for tasks assessed  Consistency Results   Thin Liquid Thin Liquid: Impaired Presentation: Cup Oral Phase Impairments: Impaired anterior to posterior transit Oral Phase Functional Implications: Oral holding Pharyngeal  Phase Impairments: Delayed Swallow;Wet Vocal Quality;Cough - Delayed  Nectar Thick Liquid Nectar Thick Liquid: Impaired Presentation: Spoon;Cup Oral Phase Impairments: Impaired anterior to posterior transit Oral phase functional implications: Oral holding Pharyngeal Phase Impairments: Delayed Swallow;Wet Vocal Quality;Cough - Delayed  Honey Thick Liquid Honey Thick Liquid: Impaired Presentation: Spoon;Cup Oral Phase Impairments: Impaired anterior to posterior transit Oral Phase Functional Implications: Oral holding;Oral residue Pharyngeal Phase Impairments: Delayed Swallow;Decreased hyoid-laryngeal movement;Wet Vocal Quality;Cough - Delayed  Puree Puree: Impaired Presentation: Spoon Oral Phase Functional Implications: Oral holding Pharyngeal Phase Impairments: Delayed Swallow;Wet Vocal Quality;Cough - Delayed Other Comments: s/s with all consistencies  Discussion with RN and with call to MD concerning patient high risk for aspiration and recs for NPO status at this time.  Also spoke with brother who was present and with wife via phone to explain eval results and recs at this time.  Sitter present and aware of NPO status also.  Will plan for ST to reassess on 12/30/10 with further recs at that time   Meda Klinefelter 12/29/2010,5:33 PM

## 2010-12-29 NOTE — Progress Notes (Signed)
UR Chart Review Completed  

## 2010-12-30 ENCOUNTER — Inpatient Hospital Stay (HOSPITAL_COMMUNITY): Payer: Medicare Other

## 2010-12-30 ENCOUNTER — Encounter (HOSPITAL_COMMUNITY): Payer: Self-pay | Admitting: Internal Medicine

## 2010-12-30 DIAGNOSIS — Q892 Congenital malformations of other endocrine glands: Secondary | ICD-10-CM

## 2010-12-30 LAB — BASIC METABOLIC PANEL
BUN: 15 mg/dL (ref 6–23)
CO2: 26 mEq/L (ref 19–32)
Calcium: 9.2 mg/dL (ref 8.4–10.5)
Creatinine, Ser: 0.92 mg/dL (ref 0.50–1.35)
GFR calc non Af Amer: 78 mL/min — ABNORMAL LOW (ref >90)
Glucose, Bld: 71 mg/dL (ref 70–99)

## 2010-12-30 LAB — CBC
HCT: 35.1 % — ABNORMAL LOW (ref 39.0–52.0)
Hemoglobin: 11.6 g/dL — ABNORMAL LOW (ref 13.0–17.0)
MCH: 32 pg (ref 26.0–34.0)
MCV: 97 fL (ref 78.0–100.0)
RBC: 3.62 MIL/uL — ABNORMAL LOW (ref 4.22–5.81)
WBC: 5.7 10*3/uL (ref 4.0–10.5)

## 2010-12-30 MED ORDER — PANTOPRAZOLE SODIUM 40 MG IV SOLR
40.0000 mg | INTRAVENOUS | Status: DC
  Administered 2010-12-30 – 2011-01-01 (×2): 40 mg via INTRAVENOUS
  Filled 2010-12-30 (×2): qty 40

## 2010-12-30 MED ORDER — METOPROLOL TARTRATE 1 MG/ML IV SOLN
5.0000 mg | Freq: Four times a day (QID) | INTRAVENOUS | Status: DC
  Administered 2010-12-30 – 2010-12-31 (×3): 5 mg via INTRAVENOUS
  Filled 2010-12-30 (×5): qty 5

## 2010-12-30 MED ORDER — KCL IN DEXTROSE-NACL 20-5-0.9 MEQ/L-%-% IV SOLN
INTRAVENOUS | Status: DC
  Administered 2010-12-30 – 2011-01-01 (×4): via INTRAVENOUS

## 2010-12-30 MED ORDER — ASPIRIN 300 MG RE SUPP
150.0000 mg | Freq: Every day | RECTAL | Status: DC
  Administered 2010-12-31 – 2011-01-02 (×3): 150 mg via RECTAL
  Filled 2010-12-30 (×4): qty 1

## 2010-12-30 MED ORDER — LORAZEPAM 0.5 MG PO TABS: 0.5000 mg | ORAL_TABLET | ORAL | Status: DC | PRN

## 2010-12-30 MED ORDER — OLANZAPINE 5 MG PO TABS: 2.5000 mg | ORAL_TABLET | Freq: Every day | ORAL | Status: DC

## 2010-12-30 MED ORDER — CLONIDINE HCL 0.1 MG/24HR TD PTWK
0.1000 mg | MEDICATED_PATCH | TRANSDERMAL | Status: DC
  Administered 2010-12-30: 0.1 mg via TRANSDERMAL
  Filled 2010-12-30: qty 1

## 2010-12-30 NOTE — Progress Notes (Addendum)
Subjective: The patient is currently lying in bed. His wife is at his bedside. Questions were answered. He appears to be calm currently. He has no complaints of chest pain shortness of breath or comment pain.  Objective: Vital signs in last 24 hours: Filed Vitals:   12/30/10 0000 12/30/10 0500 12/30/10 0709 12/30/10 0900  BP: 181/98  171/101 162/97  Pulse: 69  68 71  Temp: 97.9 F (36.6 C)  97.7 F (36.5 C)   TempSrc: Oral  Axillary   Resp: 18  16   Height:      Weight:  49.1 kg (108 lb 3.9 oz)    SpO2:   97%     Intake/Output Summary (Last 24 hours) at 12/30/10 1046 Last data filed at 12/29/10 1320  Gross per 24 hour  Intake     30 ml  Output      0 ml  Net     30 ml    Weight change: 0.111 kg (3.9 oz)  Physical exam: General: Alert, confused but pleasant and not agitated. Neck: Large soft mass at the base of the neck. Nontender. This is chronic according to his wife. Lungs: Clear anteriorly with decreased breath sounds in the bases. He has several wet coughs. Heart: S1, S2, no murmurs rubs or gallops. Abdomen: Positive bowel sounds, soft, nontender, nondistended. Extremities: No pedal edema. Neurologic: The patient is alert. He is oriented to himself and his wife. Currently not agitated.  Lab Results: Basic Metabolic Panel:  Basename 12/30/10 0509 12/29/10 0530 12/28/10 2300  NA 136 141 --  K 4.9 3.6 --  CL 103 103 --  CO2 26 29 --  GLUCOSE 71 83 --  BUN 15 13 --  CREATININE 0.92 0.83 --  CALCIUM 9.2 9.9 --  MG -- -- 1.9  PHOS -- -- --   Liver Function Tests:  Basename 12/28/10 2300  AST 31  ALT 40  ALKPHOS 84  BILITOT 0.2*  PROT 7.1  ALBUMIN 3.4*   No results found for this basename: LIPASE:2,AMYLASE:2 in the last 72 hours No results found for this basename: AMMONIA:2 in the last 72 hours CBC:  Basename 12/30/10 0509 12/29/10 0530 12/28/10 1645  WBC 5.7 7.1 --  NEUTROABS -- -- 3.3  HGB 11.6* 12.8* --  HCT 35.1* 39.2 --  MCV 97.0 97.5 --  PLT  103* 105* --   Cardiac Enzymes:  Basename 12/28/10 1645  CKTOTAL 80  CKMB 1.8  CKMBINDEX --  TROPONINI <0.30   BNP: No components found with this basename: POCBNP:3 D-Dimer: No results found for this basename: DDIMER:2 in the last 72 hours CBG: No results found for this basename: GLUCAP:6 in the last 72 hours Hemoglobin A1C:  Basename 12/29/10 0530  HGBA1C 5.0   Fasting Lipid Panel:  Basename 12/29/10 0530  CHOL 183  HDL 83  LDLCALC 85  TRIG 76  CHOLHDL 2.2  LDLDIRECT --   Thyroid Function Tests:  Basename 12/29/10 0851 12/28/10 2300  TSH -- 0.975  T4TOTAL -- --  FREET4 0.95 --  T3FREE -- --  THYROIDAB -- --   Anemia Panel:  Basename 12/29/10 0851  VITAMINB12 640  FOLATE 12.0  FERRITIN 345*  TIBC 253  IRON 80  RETICCTPCT 1.3   Coagulation: No results found for this basename: LABPROT:2,INR:2 in the last 72 hours Urine Drug Screen: Drugs of Abuse  No results found for this basename: labopia,  cocainscrnur,  labbenz,  amphetmu,  thcu,  labbarb    Alcohol Level:  No results found for this basename: ETH:2 in the last 72 hours   Micro: No results found for this or any previous visit (from the past 240 hour(s)).  Studies/Results: Dg Pelvis 1-2 Views  12/28/2010  *RADIOLOGY REPORT*  Clinical Data: Multiple falls, bilateral hip tenderness  PELVIS - 1-2 VIEW  Comparison: CT abdomen pelvis dated 09/03/2010  Findings: No fracture or dislocation is seen.  Bilateral hip joint spaces are symmetric.  Mild degenerative changes of the lower lumbar spine.  IMPRESSION: No fracture or dislocation is seen.  Original Report Authenticated By: Charline Bills, M.D.   Ct Head Wo Contrast  12/28/2010  *RADIOLOGY REPORT*  Clinical Data: Fall, right hand numbness, leg weakness, history stroke, hypertension, coronary artery disease  CT HEAD WITHOUT CONTRAST  Technique:  Contiguous axial images were obtained from the base of the skull through the vertex without contrast.   Comparison: 03/13/2010  Findings: Generalized atrophy. Normal ventricular morphology. No midline shift or mass effect. Small vessel chronic ischemic changes of deep cerebral white matter. No intracranial hemorrhage, mass lesion, or acute infarction. Visualized paranasal sinuses and mastoid air cells clear. Bones unremarkable.  IMPRESSION: Atrophy with small vessel chronic ischemic changes of deep cerebral white matter. No acute intracranial abnormalities.  Original Report Authenticated By: Lollie Marrow, M.D.   Mr Angiogram Head Wo Contrast  12/29/2010  *RADIOLOGY REPORT*  Clinical Data: *RADIOLOGY REPORT*  Clinical Data:  Fall.  Right hand numbness and bilateral leg weakness.  History of prior stroke.  MRI HEAD WITHOUT CONTRAST MRA HEAD WITHOUT CONTRAST  Technique: Multiplanar, multiecho pulse sequences of the brain and surrounding structures were obtained according to standard protocol without intravenous contrast.  Angiographic images of the head were obtained using MRA technique without contrast.  Comparison: 12/28/2010 CT.  12/14/2007 MR.  MRI HEAD  Findings:  Motion degraded exam.  No acute infarct.  No intracranial hemorrhage.  No intracranial mass lesion detected on this unenhanced motion degraded exam.  Prominent small vessel disease type changes.  Global atrophy.  Ventricular prominence probably related to atrophy rather than hydrocephalus having progressed slightly since prior exam.  C2-3 cervical spondylotic changes with mild spinal stenosis.  Paranasal sinus mucosal thickening most notable right maxillary sinus.  IMPRESSION: Motion degraded exam.  No acute infarct.  Prominent small vessel disease type changes.  Global atrophy with ventricular prominence as noted above.  MRA HEAD  Findings: Motion degraded exam.  Grading stenosis or evaluating for aneurysm is markedly limited on the present exam secondary to the motion.  What can be stated with certainty is that there is flow within portions of the  right vertebral artery, basilar artery and internal carotid arteries.  Nonvisualization left vertebral artery and right middle cerebral artery branches.  Suggestion of moderate to marked stenosis involve portions of the; proximal basilar artery, posterior cerebral artery and superior cerebellar artery bilaterally, cavernous segment internal carotid artery bilaterally, A1 segment and A2 segment of the anterior cerebral artery bilaterally, left middle cerebral bifurcation and left middle cerebral artery branch vessels.  IMPRESSION: Motion degraded exam as discussed above.  Original Report Authenticated By: Fuller Canada, M.D.   Mr Brain Wo Contrast  12/29/2010  *RADIOLOGY REPORT*  Clinical Data: *RADIOLOGY REPORT*  Clinical Data:  Fall.  Right hand numbness and bilateral leg weakness.  History of prior stroke.  MRI HEAD WITHOUT CONTRAST MRA HEAD WITHOUT CONTRAST  Technique: Multiplanar, multiecho pulse sequences of the brain and surrounding structures were obtained according to standard protocol without intravenous contrast.  Angiographic images of the head were obtained using MRA technique without contrast.  Comparison: 12/28/2010 CT.  12/14/2007 MR.  MRI HEAD  Findings:  Motion degraded exam.  No acute infarct.  No intracranial hemorrhage.  No intracranial mass lesion detected on this unenhanced motion degraded exam.  Prominent small vessel disease type changes.  Global atrophy.  Ventricular prominence probably related to atrophy rather than hydrocephalus having progressed slightly since prior exam.  C2-3 cervical spondylotic changes with mild spinal stenosis.  Paranasal sinus mucosal thickening most notable right maxillary sinus.  IMPRESSION: Motion degraded exam.  No acute infarct.  Prominent small vessel disease type changes.  Global atrophy with ventricular prominence as noted above.  MRA HEAD  Findings: Motion degraded exam.  Grading stenosis or evaluating for aneurysm is markedly limited on the present  exam secondary to the motion.  What can be stated with certainty is that there is flow within portions of the right vertebral artery, basilar artery and internal carotid arteries.  Nonvisualization left vertebral artery and right middle cerebral artery branches.  Suggestion of moderate to marked stenosis involve portions of the; proximal basilar artery, posterior cerebral artery and superior cerebellar artery bilaterally, cavernous segment internal carotid artery bilaterally, A1 segment and A2 segment of the anterior cerebral artery bilaterally, left middle cerebral bifurcation and left middle cerebral artery branch vessels.  IMPRESSION: Motion degraded exam as discussed above.  Original Report Authenticated By: Fuller Canada, M.D.    Medications: I have reviewed the patient's current medications.  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 Frequent falls at home/gait abnormality and concomitant right upper extremity weakness. MRI/MRA of the brain reveals posterior circulation stenosis of areas arteries. He is certainly not a surgical or interventional candidate. He will remain on antiplatelet therapy. 2-D echocardiogram revealed preserved LV function and carotid ultrasound is pending.  Dementia with delirium. The patient is on several psychotropic medications for dementia with agitation including. According to Mrs. Pricilla Holm, Zyprexa was discontinued and he is given Haldol and Xanax chronically.  Thrombocytopenia of unknown etiology. His vitamin B 12 and thyroid function are within normal limits.  Mild anemia. An anemia panel is surprisingly unremarkable.  Transient  bradycardia. His thyroid function is within normal limits.  Hypokalemia, repleted.  Coronary artery disease/hypertension. He is treated chronically with ramipril, metoprolol, simvastatin. Because he is relatively n.p.o., IV metoprolol when necessary and IV hydralazine when necessary have been added. We'll also add Catapres patch for better blood pressure control. Short acting metoprolol was changed to Toprol-XL.   Probable dysphagia. The preliminary speech therapy consultation is noted. The patient is now n.p.o. until further assessment of his dysphagia.  Cough. We'll order a chest x-ray to rule out aspiration pneumonia.  Neck mass. This mass is soft. It does not appear to be a goiter.   Plan:  We'll check the carotid ultrasound pending. Order a chest x-ray to rule out aspiration. We'll discontinue Zyprexa. Will continue when necessary IV Haldol. Continue n.p.o. status until speech therapy followup evaluation. Consider short-term skilled nursing facility placement, based on the physical therapist evaluation. The patient's wife wants to try to take him home if possible.    LOS: 2 days   Moua Rasmusson 12/30/2010, 10:46  AM    

## 2010-12-30 NOTE — Progress Notes (Signed)
Physical Therapy Treatment Patient Details Name: [name] MRN: [id number] DOB: 1929-09-19 Today's Date: 12/30/2010  TIME:[phone]/ 20 mins TE  PT Assessment/Plan  PT - Assessment/Plan Comments on Treatment Session: Pt was able to complete most supine exercises, cognitive level prevented following of some commands however;Pt was Max A/Total A for all bed mobility-sitting balance-sit<>stand and bed<>chair. Pt can not sit at EOB wiithout assisstance and attempts at standing with RW and use of PTA's shoulders were very unsafe due to patients severe postterior lean and inabiilty to get his feet under his COG;very high risk for falls PT Goals     PT Treatment Precautions/Restrictions  Precautions Precautions: Fall Required Braces or Orthoses: No Restrictions Weight Bearing Restrictions: No Mobility (including Balance) Bed Mobility Supine to Sit: 2: Max assist;HOB elevated (Comment degrees) Supine to Sit Details (indicate cue type and reason): HOB elevated 65 degrees;assistance needed with trunk  Sitting - Scoot to Edge of Bed: 1: +1 Total assist Sit to Supine - Left: 2: Max assist Sit to Supine - Left Details (indicate cue type and reason): assistance with LEs and trunk Transfers Transfers: Yes Sit to Stand: 2: Max assist Sit to Stand Details (indicate cue type and reason): verbal cues for hand and feet placement, anterior weight shifting Stand to Sit: 3: Mod assist Ambulation/Gait Ambulation/Gait: No Stairs: No Wheelchair Mobility Wheelchair Mobility: No    Exercise  General Exercises - Lower Extremity Ankle Circles/Pumps: Both;15 reps Quad Sets: Both;10 reps Hip ABduction/ADduction: Both;10 reps Straight Leg Raises: Both;10 reps Other Exercises Other Exercises: bridgeing x10 End of Session PT - End of Session Equipment Utilized During Treatment: Gait belt Activity Tolerance: Treatment limited secondary to medical complications (Comment) Patient left: in  chair;with call bell in reach;with family/visitor present (chair alarm set) General Behavior During Session: [facility] for tasks performed Cognition: Impaired, at baseline  [name] 12/30/2010, 10:31 AM

## 2010-12-30 NOTE — Procedures (Signed)
Modified Barium Swallow Procedure Note Patient Details  Name: [name] MRN: [id number] Date of Birth: 1929-09-13  Today's Date: 12/30/2010 Time:  - 11:15    Past Medical History:  Past Medical History  Diagnosis Date  . Stroke   . Hypertension   . Coronary artery disease   . Frequent falls 12/29/2010  . Dementia   . Anemia 12/29/2010  . Thrombocytopenia 12/29/2010  . Senile dementia with delirium 12/29/2010  . 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 Cerebellar artery occlusion or stenosis 12/29/2010  . Basilar artery stenosis 12/29/2010   Past Surgical History:  Past Surgical History  Procedure Date  . Cardiac surgery    HPI:   See bedside. Pt presents with audible signs/symptoms aspiration on all consistencies.  Recommendation/Prognosis  Clinical Impression Dysphagia Diagnosis: Moderate oral phase dysphagia;Severe pharyngeal phase dysphagia;Mild cervical esophageal phase dysphagia Clinical impression: Overall moderate/severe oropharyngeal phase dysphagia in setting of acute CVA characterized by weak lingual manipulation, decreased A-P transit, delay in swallow initiation, severely reduced pharyngeal pressure/peristalsis resulting in severe pyriform residuals after the swallow putting him at great risk for aspiration. Residuals were less with thin liquids (over nectars and puree), however residuals build and spill into laryngeal vestibule. It seemed a head turn to the left was beneficial initially to reduce residuals, however over the course of the study it did not help greatly. Recommendations General Recommendation: NPO;Ice chips PRN after oral care Oral Care Recommendations: Oral care QID;Staff/trained caregiver to provide oral care Follow up Recommendations: Skilled Nursing facility Prognosis Prognosis for Safe Diet Advancement: Guarded Barriers to Reach Goals: Severity of dysphagia Individuals Consulted Consulted and  Agree with Results and Recommendations: Patient;Family member/caregiver;RN;MD Family Member Consulted: Wife  SLP Assessment/Plan  NPO with recommendation for temporary alternative source of nutrition. Repeat MBSS on Monday, January 03, 2011.  General:  Date of Onset: 12/29/10 HPI: 77 yo male admitted s/p fall on Tuesday with likely CVA. He had acute onset dysphagia yesterday at lunch and now shows postive signs/symptoms aspiration on all consistencies. He had a MBSS completed by me in May 2011 and was placed on a regular diet with thin liquids. At that time, minimal pyriform residuals were noted but were cleared with repeat swallow. His wife accompanied him to the MBSS today. Type of Study: Initial MBS (Old MBSS 06/05/2009 regular/thin) Diet Prior to this Study: NPO Temperature Spikes Noted: No Respiratory Status: Room air History of Intubation: No Behavior/Cognition: Alert;Cooperative;Pleasant mood;Requires cueing Oral Cavity - Dentition: Adequate natural dentition (missing molars) Oral Motor / Sensory Function: Impaired - see Bedside swallow eval Vision: Functional for self-feeding Patient Positioning: Upright in chair Baseline Vocal Quality: Wet;Low vocal intensity (seems to have difficulty with secretions) Volitional Cough: Weak;Wet;Congested Volitional Swallow: Able to elicit Anatomy: Other (Comment) (Pt has goiter that he's apparantly had all his life) Pharyngeal Secretions: Not observed secondary MBS Ice chips: Not tested  Reason for Referral:  Objectively evaluate swallow function due to concerns of possible aspiration and identify appropriate diet and compensatory strategies as needed.  Oral Phase Oral Preparation/Oral Phase Oral Phase: Impaired Oral - Solids Oral - Puree: Weak lingual manipulation;Reduced posterior propulsion;Holding of bolus;Delayed oral transit Oral Phase - Comment Oral Phase - Comment: Slow oral prep with weak lingual manipulation, spills over base of  tongue to valleculae  Pharyngeal Phase  Pharyngeal Phase Pharyngeal Phase: Impaired Pharyngeal - Nectar Pharyngeal - Nectar Teaspoon: Delayed swallow initiation;Premature spillage to valleculae;Premature spillage to pyriform;Reduced pharyngeal peristalsis;Pharyngeal residue - valleculae;Pharyngeal residue -  pyriform (min vallecular residue, mod/sev pyriform residue) Pharyngeal - Thin Pharyngeal - Thin Teaspoon: Delayed swallow initiation;Premature spillage to valleculae;Premature spillage to pyriform;Reduced pharyngeal peristalsis;Pharyngeal residue - valleculae;Pharyngeal residue - pyriform (less residuals than nectars and puree, but still present) Pharyngeal - Thin Straw: Premature spillage to valleculae;Penetration/Aspiration after swallow;Trace aspiration;Pharyngeal residue - valleculae;Pharyngeal residue - pyriform;Pharyngeal residue - posterior pharnyx;Inter-arytenoid space residue Penetration/Aspiration details (thin straw): Material enters airway, passes BELOW cords then ejected out;Material enters airway, CONTACTS cords then ejected out Pharyngeal - Solids Pharyngeal - Puree: Delayed swallow initiation;Premature spillage to valleculae;Reduced pharyngeal peristalsis;Pharyngeal residue - posterior pharnyx;Pharyngeal residue - pyriform;Pharyngeal residue - valleculae (repeat swallows, head turn to left) Pharyngeal Phase - Comment Pharyngeal Comment: Pt has adequate hyolaryngeal excursion, but extremely reduces pharyngeal contraction/peristalsis resulting in severe pyriform and posterior pharynx residue with great risk for aspiration.  Cervical Esophageal Phase  Cervical Esophageal Phase Cervical Esophageal Phase: Impaired Cervical Esophageal Phase - Solids Puree: Reduced cricopharyngeal relaxation Cervical Esophageal Phase - Comment Cervical Esophageal Comment: Only small amounts of po pass through UES with each swallow.  [name], CCC-SLP [phone]  [name] 12/30/2010,  12:01 PM

## 2010-12-31 ENCOUNTER — Encounter (HOSPITAL_COMMUNITY): Payer: Self-pay | Admitting: Internal Medicine

## 2010-12-31 ENCOUNTER — Inpatient Hospital Stay (HOSPITAL_COMMUNITY): Payer: Medicare Other

## 2010-12-31 DIAGNOSIS — G2401 Drug induced subacute dyskinesia: Secondary | ICD-10-CM

## 2010-12-31 DIAGNOSIS — I10 Essential (primary) hypertension: Secondary | ICD-10-CM | POA: Diagnosis present

## 2010-12-31 DIAGNOSIS — R1310 Dysphagia, unspecified: Secondary | ICD-10-CM | POA: Diagnosis present

## 2010-12-31 HISTORY — DX: Drug induced subacute dyskinesia: G24.01

## 2010-12-31 LAB — BASIC METABOLIC PANEL
BUN: 16 mg/dL (ref 6–23)
Calcium: 9.3 mg/dL (ref 8.4–10.5)
Creatinine, Ser: 1.06 mg/dL (ref 0.50–1.35)
GFR calc non Af Amer: 65 mL/min — ABNORMAL LOW (ref >90)
Glucose, Bld: 90 mg/dL (ref 70–99)

## 2010-12-31 MED ORDER — METOPROLOL TARTRATE 1 MG/ML IV SOLN: 5.0000 mg | INTRAVENOUS | Status: DC

## 2010-12-31 MED ORDER — CLONIDINE HCL 0.2 MG/24HR TD PTWK: 0.2000 mg | MEDICATED_PATCH | TRANSDERMAL | Status: DC

## 2010-12-31 MED ORDER — LORAZEPAM 2 MG/ML IJ SOLN
0.5000 mg | INTRAMUSCULAR | Status: DC | PRN
  Administered 2010-12-31 – 2011-01-03 (×4): 0.5 mg via INTRAVENOUS
  Administered 2011-01-04: via INTRAVENOUS
  Filled 2010-12-31 (×4): qty 1

## 2010-12-31 MED ORDER — METOPROLOL TARTRATE 1 MG/ML IV SOLN
5.0000 mg | INTRAVENOUS | Status: DC
  Administered 2010-12-31 – 2011-01-01 (×4): 5 mg via INTRAVENOUS
  Filled 2010-12-31 (×4): qty 5

## 2010-12-31 MED ORDER — PRO-STAT SUGAR FREE PO LIQD
30.0000 mL | Freq: Every day | ORAL | Status: DC
  Administered 2011-01-01 – 2011-01-02 (×2): 30 mL
  Filled 2010-12-31 (×2): qty 30

## 2010-12-31 MED ORDER — OSMOLITE 1.2 CAL PO LIQD
1000.0000 mL | ORAL | Status: DC
  Filled 2010-12-31 (×2): qty 1000

## 2010-12-31 MED ORDER — CLONAZEPAM 0.5 MG PO TABS
0.2500 mg | ORAL_TABLET | Freq: Two times a day (BID) | ORAL | Status: DC
  Administered 2010-12-31 – 2011-01-04 (×5): 0.25 mg via ORAL
  Administered 2011-01-04: via ORAL
  Administered 2011-01-04 – 2011-01-05 (×2): 0.25 mg via ORAL
  Filled 2010-12-31 (×9): qty 1

## 2010-12-31 NOTE — Progress Notes (Signed)
Attempted to place NG tube.  Unable to - pt becoming agitated and combative refuses.  PRN IV Haldol given, will re-try later if pt calms down and is willing.

## 2010-12-31 NOTE — Progress Notes (Signed)
Subjective: The patient is sitting up in a chair. His wife is in the room. Overall, she believes that he is less confused and more oriented. The sister is currently in the room. He has been less agitated this morning.  Objective: Vital signs in last 24 hours: Filed Vitals:   12/30/10 1700 12/30/10 2233 12/31/10 0254 12/31/10 0512  BP: 175/66  160/81 185/92  Pulse: 84 67 93 57  Temp: 97.6 F (36.4 C) 98.3 F (36.8 C) 98.4 F (36.9 C) 97.5 F (36.4 C)  TempSrc: Oral Oral Oral Oral  Resp: 20 20 20 20   Height:      Weight:      SpO2:   100% 99%   No intake or output data in the 24 hours ending 12/31/10 1140  Weight change:   Physical exam: General: Alert, confused but pleasant and not agitated. Neck: Large soft mass at the base of the neck. Nontender. This is chronic according to his wife. Lungs: Clear anteriorly with decreased breath sounds in the bases. He has several wet coughs. Heart: S1, S2, no murmurs rubs or gallops. Abdomen: Positive bowel sounds, soft, nontender, nondistended. Extremities: No pedal edema. Neurologic: The patient is alert. He is oriented to himself and his wife. Currently not agitated. Neurological exam is easier this morning. He has a severely ataxic right upper extremity and slightly less ataxic left upper extremity. He has significant overshoot with his right hand with finger to nose testing. He has some mild cogwheel rigidity in his right upper extremity greater than his left upper extremity. His cranial nerves appear to be grossly intact. He is able to raise both arms against gravity 90. He is able to lift each leg against gravity approximately 30. Sensation is grossly intact.  Lab Results: Basic Metabolic Panel:  Basename 12/31/10 0525 12/30/10 0509 12/28/10 2300  NA 137 136 --  K 4.3 4.9 --  CL 104 103 --  CO2 27 26 --  GLUCOSE 90 71 --  BUN 16 15 --  CREATININE 1.06 0.92 --  CALCIUM 9.3 9.2 --  MG -- -- 1.9  PHOS -- -- --   Liver Function  Tests:  Basename 12/28/10 2300  AST 31  ALT 40  ALKPHOS 84  BILITOT 0.2*  PROT 7.1  ALBUMIN 3.4*   No results found for this basename: LIPASE:2,AMYLASE:2 in the last 72 hours No results found for this basename: AMMONIA:2 in the last 72 hours CBC:  Basename 12/30/10 0509 12/29/10 0530 12/28/10 1645  WBC 5.7 7.1 --  NEUTROABS -- -- 3.3  HGB 11.6* 12.8* --  HCT 35.1* 39.2 --  MCV 97.0 97.5 --  PLT 103* 105* --   Cardiac Enzymes:  Basename 12/28/10 1645  CKTOTAL 80  CKMB 1.8  CKMBINDEX --  TROPONINI <0.30   BNP: No components found with this basename: POCBNP:3 D-Dimer: No results found for this basename: DDIMER:2 in the last 72 hours CBG: No results found for this basename: GLUCAP:6 in the last 72 hours Hemoglobin A1C:  Basename 12/29/10 0530  HGBA1C 5.0   Fasting Lipid Panel:  Basename 12/29/10 0530  CHOL 183  HDL 83  LDLCALC 85  TRIG 76  CHOLHDL 2.2  LDLDIRECT --   Thyroid Function Tests:  Basename 12/29/10 0851 12/28/10 2300  TSH -- 0.975  T4TOTAL -- --  FREET4 0.95 --  T3FREE -- --  THYROIDAB -- --   Anemia Panel:  Basename 12/29/10 0851  VITAMINB12 640  FOLATE 12.0  FERRITIN 345*  TIBC  253  IRON 80  RETICCTPCT 1.3   Coagulation: No results found for this basename: LABPROT:2,INR:2 in the last 72 hours Urine Drug Screen: Drugs of Abuse  No results found for this basename: labopia,  cocainscrnur,  labbenz,  amphetmu,  thcu,  labbarb    Alcohol Level: No results found for this basename: ETH:2 in the last 72 hours   Micro: No results found for this or any previous visit (from the past 240 hour(s)).  Studies/Results: Mr Angiogram Head Wo Contrast  12/29/2010  *RADIOLOGY REPORT*  Clinical Data: *RADIOLOGY REPORT*  Clinical Data:  Fall.  Right hand numbness and bilateral leg weakness.  History of prior stroke.  MRI HEAD WITHOUT CONTRAST MRA HEAD WITHOUT CONTRAST  Technique: Multiplanar, multiecho pulse sequences of the brain and  surrounding structures were obtained according to standard protocol without intravenous contrast.  Angiographic images of the head were obtained using MRA technique without contrast.  Comparison: 12/28/2010 CT.  12/14/2007 MR.  MRI HEAD  Findings:  Motion degraded exam.  No acute infarct.  No intracranial hemorrhage.  No intracranial mass lesion detected on this unenhanced motion degraded exam.  Prominent small vessel disease type changes.  Global atrophy.  Ventricular prominence probably related to atrophy rather than hydrocephalus having progressed slightly since prior exam.  C2-3 cervical spondylotic changes with mild spinal stenosis.  Paranasal sinus mucosal thickening most notable right maxillary sinus.  IMPRESSION: Motion degraded exam.  No acute infarct.  Prominent small vessel disease type changes.  Global atrophy with ventricular prominence as noted above.  MRA HEAD  Findings: Motion degraded exam.  Grading stenosis or evaluating for aneurysm is markedly limited on the present exam secondary to the motion.  What can be stated with certainty is that there is flow within portions of the right vertebral artery, basilar artery and internal carotid arteries.  Nonvisualization left vertebral artery and right middle cerebral artery branches.  Suggestion of moderate to marked stenosis involve portions of the; proximal basilar artery, posterior cerebral artery and superior cerebellar artery bilaterally, cavernous segment internal carotid artery bilaterally, A1 segment and A2 segment of the anterior cerebral artery bilaterally, left middle cerebral bifurcation and left middle cerebral artery branch vessels.  IMPRESSION: Motion degraded exam as discussed above.  Original Report Authenticated By: Fuller Canada, M.D.   Mr Brain Wo Contrast  12/29/2010  *RADIOLOGY REPORT*  Clinical Data: *RADIOLOGY REPORT*  Clinical Data:  Fall.  Right hand numbness and bilateral leg weakness.  History of prior stroke.  MRI HEAD  WITHOUT CONTRAST MRA HEAD WITHOUT CONTRAST  Technique: Multiplanar, multiecho pulse sequences of the brain and surrounding structures were obtained according to standard protocol without intravenous contrast.  Angiographic images of the head were obtained using MRA technique without contrast.  Comparison: 12/28/2010 CT.  12/14/2007 MR.  MRI HEAD  Findings:  Motion degraded exam.  No acute infarct.  No intracranial hemorrhage.  No intracranial mass lesion detected on this unenhanced motion degraded exam.  Prominent small vessel disease type changes.  Global atrophy.  Ventricular prominence probably related to atrophy rather than hydrocephalus having progressed slightly since prior exam.  C2-3 cervical spondylotic changes with mild spinal stenosis.  Paranasal sinus mucosal thickening most notable right maxillary sinus.  IMPRESSION: Motion degraded exam.  No acute infarct.  Prominent small vessel disease type changes.  Global atrophy with ventricular prominence as noted above.  MRA HEAD  Findings: Motion degraded exam.  Grading stenosis or evaluating for aneurysm is markedly limited on the present exam  secondary to the motion.  What can be stated with certainty is that there is flow within portions of the right vertebral artery, basilar artery and internal carotid arteries.  Nonvisualization left vertebral artery and right middle cerebral artery branches.  Suggestion of moderate to marked stenosis involve portions of the; proximal basilar artery, posterior cerebral artery and superior cerebellar artery bilaterally, cavernous segment internal carotid artery bilaterally, A1 segment and A2 segment of the anterior cerebral artery bilaterally, left middle cerebral bifurcation and left middle cerebral artery branch vessels.  IMPRESSION: Motion degraded exam as discussed above.  Original Report Authenticated By: Fuller Canada, M.D.   Dg Chest Port 1 View  12/30/2010  *RADIOLOGY REPORT*  Clinical Data: Cough.  Chest  congestion.  PORTABLE CHEST - 1 VIEW  Comparison: Prior studies dating to 12/14/2007  Findings: Heart size remains stable and within normal limits.  Mild tortuosity the thoracic aorta is again seen.  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  Both lungs are clear.  No evidence of pleural effusion.  IMPRESSION:  1.  No active lung disease. 2.  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  Original Report Authenticated By: Danae Orleans, M.D.   Dg Swallowing Func-no Report  12/30/2010  CLINICAL DATA: dysphagia   FLUOROSCOPY FOR SWALLOWING FUNCTION STUDY:  Fluoroscopy was provided for swallowing function study, which was  administered by a speech pathologist.  Final results and recommendations  from this study are contained within the speech pathology report.      Medications: I have reviewed the patient's current medications.  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 Frequent falls at home/gait abnormality and concomitant right upper extremity weakness and ataxia. I believe, given further evaluation of the patient, it is likely he has an element of tardive dyskinesia from being on two dopamine antagonists. MRI/MRA of the brain reveals posterior circulation partial stenosis of several arteries including the superior cerebellar artery and therefore she may have progressive movement disorders from these stenotic lesions. He is certainly not a surgical or interventional candidate. He will remain on antiplatelet therapy.  2-D echocardiogram revealed preserved LV function and carotid ultrasound is pending.  Dementia with delirium. The patient is on several psychotropic medications for dementia with agitation. Now Mrs. Roye says that Zyprexa was not discontinued. She says it was started to stimulate his appetite in September this year.  Haldol was also started at the same time for confusion. He also takes Xanax chronically.  Severe dysphagia as noted by the speech therapy consultation/modified barium swallow. I wonder if the neck mass is impeding on his swallowing ability. Apparently it has not in the past. Based on the speech therapist evaluation, it may not be an issue. He is now n.p.o. completely.  Neck mass. Per my review of his history, he has a thyroglossal duct cyst. According to Mrs. Skeens, it has enlarged some over the last 49 years of their marriage, however, it has not increased substantially over the past few years.  Thrombocytopenia of unknown etiology. It could be from Zyprexa which can cause thrombocytopenia. His  vitamin B 12 and thyroid function are within normal limits.  Mild anemia. An anemia panel is surprisingly unremarkable.  Transient bradycardia. His thyroid function is within normal limits.  Hypokalemia, repleted.  Coronary artery disease/malignant hypertension. He is treated chronically with ramipril, metoprolol, simvastatin. Because he is  n.p.o., IV metoprolol and Catapres patch has been started.  Cough. His chest x-ray revealed no active disease.     Plan:  We'll check the carotid ultrasound pending. The patient was too agitated for the test yesterday. We'll consult Dr. Gerilyn Pilgrim for evaluation of his movement disorder or possible tardive dyskinesia. We'll insert an NG tube for nutrition. The speech therapist will reevaluate him on next Monday. If he fails the swallowing evaluation, he may need a PEG tube. I have discussed this with his wife who is leaning toward PEG tube  insertion if he continues to have severe dysphagia.     LOS: 3 days   [name] 12/31/2010, 11:40 AM

## 2010-12-31 NOTE — Consult Note (Signed)
NAMEJOWAN, [name]              ACCOUNT NO.:  [id number]  MEDICAL RECORD NO.:  [id number]  LOCATION:  A316                          FACILITY:  [facility]  PHYSICIAN:  Yarnell Arvidson A. Gerilyn Pilgrim, M.D. DATE OF BIRTH:  Jan 20, 1929  DATE OF CONSULTATION: DATE OF DISCHARGE:                                CONSULTATION   REASON FOR CONSULTATION:  Weakness and tremor of right upper extremity.  The patient is a 77 year old black male who has significant comorbidities at baseline.  He was at the house per his wife when wife noted that he fell to the ground.  She actually did not witness this, but heard him falling to the ground and went to investigate.  He was on the floor and she helped him up.  Apparently, there was no loss of consciousness, but she noted that he had weakness of the right hand.  He was not able to use it and do things as he typically can do.  He was staying at the hospital and had a very extensive workup including MRI, MRA, and echocardiography.  The MRI does not show an acute infarct.  The wife reports that he continues to have end coordination and inability to use the hand on the right as he was able to do in the past.  There are also what appears to be shaking and tremoring of the right hand and Dr. Sherrie Mustache wanter further evaluation of this.  PHYSICAL EXAMINATION:  VITAL SIGNS:  Blood pressure is somewhat elevated at 170/80 tonight. GENERAL:  He is awake and alert.  He is cooperative with the evaluation. NECK:  A large multinodular goiter.  Neck is supple. HEENT:  Head is normocephalic, atraumatic. ABDOMEN:  Soft. EXTREMITIES:  No significant edema. NEUROLOGIC:  Mentation, he is awake and alert.  He is lucid.  He cooperates with the evaluation.  He is only oriented x1, however.  Wife reports that he has been somewhat confused asking for his parents and talking about things in the past.  No dysarthria is observed.  Cranial nerve evaluation, pupils are equal, round, reactive to light  and accommodation.  He is status post lens implantation.  Visual fields are intact.  Extraocular movements are full.  Facial muscle strength is symmetric.  Tongue is midline.  Uvula midline.  Shoulder shrug normal. Motor examination shows a wrist drop in right upper extremity.  This was initially disguised as the patient is in mittens; however, moving the mittens shows a clear wrist drop on the right with inability to extend the wrist altogether.  Hand grip, however, in other muscle groups of the upper extremities are normal.  The other extremities show normal tone, bulk, and strength.  Coordination showed no dysmetria.  He does seem to have some episodic high-amplitude, low-frequency jerking tremor/even clonic activity of his right upper extremity.  Again, no dysmetria is noted.  Reflexes are brisk in the legs and normal in the upper extremities.  Plantars both downgoing.  Sensation is unreliable.  MRI is reviewed in person and shows severe confluent leukoencephalopathy.  Bright on FLAIR imaging and dark on T1.  Nothing acute is seen on diffusion imaging.  No hemorrhages seen on echo gradient sequence.  MRA shows what appears be an occluded left vertebral.  The basilar artery looks fine from my standpoint.  The other significant finding is lack of flow involving the right MCA.  Family reports that he had an old stroke in the past and this could be why.  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  He does seem to have a tremor.  This could be the result of the rib neuropathy.  The other issue could be the patient may have had a seizure, fell, and developed an ulnar neuropathy.  The shaking/clonic activity could be coming from the radial neuropathy or could be seizures, we just wrote do not know.  At this time, he does have a history of tardive dyskinesia and that may also be an issue.  RECOMMENDATIONS: 1. EEG. 2. Reduce neuroleptics use given the history of tardive  dyskinesia. 3. We may want to treat this shaking with low-dose Klonopin which will treats  all different types of movement disorder if it is a     pure tremor, it was tardive dyskinesia or if there is seizure     activity.  Continue with the current antiplatelet agents.     Naveen Clardy A. Gerilyn Pilgrim, M.D.     KAD/MEDQ  D:  12/31/2010  T:  12/31/2010  Job:  [id number]

## 2010-12-31 NOTE — Progress Notes (Addendum)
INITIAL ADULT NUTRITION ASSESSMENT Date: 12/31/2010   Time: 1:34 PM  Reason for Assessment: TF initiation   ASSESSMENT: Male 77 y.o.  Dx: Dementia, s/p fall, Severe Dysphagia   Past Medical History  Diagnosis Date  . Stroke   . Hypertension   . Coronary artery disease 09/2004    With stenting  . Frequent falls 12/29/2010  . Dementia   . Anemia 12/29/2010  . Thrombocytopenia 12/29/2010  . Senile dementia with delirium 12/29/2010  . 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 Cerebellar artery occlusion or stenosis 12/29/2010  . Basilar artery stenosis 12/29/2010  . Thyroglossal duct cyst   . Dysphagia 12/31/2010  . Tardive dyskinesia 12/31/2010    Scheduled Meds:   . aspirin  150 mg Rectal Daily  . cloNIDine  0.2 mg Transdermal Weekly  . LORazepam  0.5 mg Intravenous Q12H  . metoprolol  5 mg Intravenous Q4H  . pantoprazole (PROTONIX) IV  40 mg Intravenous Q48H  . DISCONTD: aspirin  162 mg Oral Daily  . DISCONTD: cloNIDine  0.1 mg Transdermal Weekly  . DISCONTD: metoprolol  5 mg Intravenous Q6H  . DISCONTD: metoprolol  5 mg Intravenous Q4H  . DISCONTD: metoprolol succinate  50 mg Oral Daily  . DISCONTD: pantoprazole  40 mg Oral Q1200  . DISCONTD: ramipril  10 mg Oral Daily  . DISCONTD: simvastatin  40 mg Oral QHS   Continuous Infusions:   . dextrose 5 % and 0.9 % NaCl with KCl 20 mEq/L 65 mL/hr at 12/31/10 1138  . DISCONTD: 0.9 % NaCl with KCl 20 mEq / L 65 mL/hr at 12/30/10 0916   PRN Meds:.acetaminophen, acetaminophen, bisacodyl, haloperidol lactate, hydrALAZINE, LORazepam, ondansetron (ZOFRAN) IV, senna-docusate, DISCONTD: metoprolol   Ht: 5\' 2"  (157.5 cm)  Wt: 108 lb 3.9 oz (49.1 kg)  Ideal Wt:  54.6 kg % Ideal Wt: 90%  Usual Wt: Nov. 19th pt wt 108# last( Dr. Visit-Oct. 9th wt was 106#) % Usual Wt: 100%  Body mass index is 19.80 kg/(m^2).  Food/Nutrition Related Hx: Obtained wt hx from spouse. No significant  wt loss x 90d. MBSS results reviewed. Pt now NPO. NGT to be placed. Possibility of PEG tube discussed with spouse per MD note.   CMP     Component Value Date/Time   NA 137 12/31/2010 0525   K 4.3 12/31/2010 0525   CL 104 12/31/2010 0525   CO2 27 12/31/2010 0525   GLUCOSE 90 12/31/2010 0525   BUN 16 12/31/2010 0525   CREATININE 1.06 12/31/2010 0525   CALCIUM 9.3 12/31/2010 0525   PROT 7.1 12/28/2010 2300   ALBUMIN 3.4* 12/28/2010 2300   AST 31 12/28/2010 2300   ALT 40 12/28/2010 2300   ALKPHOS 84 12/28/2010 2300   BILITOT 0.2* 12/28/2010 2300   GFRNONAA 65* 12/31/2010 0525   GFRAA 75* 12/31/2010 0525    CBC    Component Value Date/Time   WBC 5.7 12/30/2010 0509   RBC 3.62* 12/30/2010 0509   HGB 11.6* 12/30/2010 0509   HCT 35.1* 12/30/2010 0509   PLT 103* 12/30/2010 0509   MCV 97.0 12/30/2010 0509   MCH 32.0 12/30/2010 0509   MCHC 33.0 12/30/2010 0509   RDW 12.4 12/30/2010 0509   LYMPHSABS 1.4 12/28/2010 1645   MONOABS 1.1* 12/28/2010 1645   EOSABS 0.1 12/28/2010 1645   BASOSABS 0.0 12/28/2010 1645   CBG (last 3)  No results found for this basename: GLUCAP:3 in the last 72 hours  No intake or output data in the 24 hours ending 12/31/10 1349    Diet Order: NPO   IVF:    dextrose 5 % and 0.9 % NaCl with KCl 20 mEq/L Last Rate: 65 mL/hr at 12/31/10 1138  DISCONTD: 0.9 % NaCl with KCl 20 mEq / L Last Rate: 65 mL/hr at 12/30/10 0916    Estimated Nutritional Needs:   Kcal:1470-1617 Protein:74-83 grams Fluid:1.5 L/d  NUTRITION DIAGNOSIS: -Swallowing difficulty (NI-1.1).  Status: Ongoing  RELATED TO: Acute onset dysphagia  AS EVIDENCE BY: MBSS , NPO with recommendation for temporary alternative nutr source per SLP assessment  GOAL: Pt will meet >90% of minimum est energy and protein needs  MONITORING: TF tol/progression, wt changes and labs  EDUCATION NEEDS: -Education not appropriate at this time  INTERVENTION: Continuous TF via NGT. Osmolite 1.2 @  20 ml/hr advance 10 ml q 4 hr to goal rate of 50 ml/hr. Add ProStat 30 ml daily to meet est energy and protein needs. IVF to be adjusted per MD.   Dietitian [phone]  DOCUMENTATION CODES Per approved criteria  -Underweight    Murlean Seelye, Charlann Noss 12/31/2010, 1:34 PM

## 2010-12-31 NOTE — Progress Notes (Signed)
Physical Therapy Treatment Patient Details Name: [name] MRN: [id number] DOB: 1929-01-25 Today's Date: 12/31/2010  PT Assessment/Plan  PT - Assessment/Plan Comments on Treatment Session: Pt was able to perfom functional mobility exercises while sitting on EOB and standing using a RW; able to ambulate for 61feet x 2 using a RW at Min A with cues given for gait sequencing and use of AD; cues also given for sit<>stand activities. Pt still presents with occ listing to side and retrograde balance loss (post leaning). Pt able to follow 2-step commands with about 50% carryover of activities.  PT Plan: Discharge plan remains appropriate PT Frequency: Min 4X/week Follow Up Recommendations: Skilled nursing facility Equipment Recommended: Defer to next venue PT Goals  Acute Rehab PT Goals PT Goal Formulation: With patient/family Pt will go Supine/Side to Sit: with mod assist Pt will Sit at [facility] of Bed: with mod assist PT Goal: Sit at [facility] Of Bed - Progress: Revised (modified due to lack of progress/goal met) Pt will go Sit to Stand: with min assist PT Goal: Sit to Stand - Progress: Updated due to goal met Pt will go Stand to Sit: with supervision Pt will Ambulate: 51 - 150 feet;with supervision  PT Treatment Precautions/Restrictions  Precautions Precautions: Fall Required Braces or Orthoses: No Restrictions Weight Bearing Restrictions: No Mobility (including Balance) Bed Mobility Rolling Left: 5: Supervision Left Sidelying to Sit: 4: Min assist Supine to Sit: 3: Mod assist Sitting - Scoot to Edge of Bed: 3: Mod assist Transfers Transfers: Yes Sit to Stand: 4: Min assist Sit to Stand Details (indicate cue type and reason): 25% verbal cues for sequencing (pushing from arms of chair or from bed with 50% carry-over at this time Stand to Sit: 4: Min assist Stand to Sit Details: 25% verbal cues for sequencing (reaching back to arms of chair and/or on bed with 50% carry-over at this  time Ambulation/Gait Ambulation/Gait: Yes Ambulation/Gait Assistance: 4: Min assist Ambulation/Gait Assistance Details (indicate cue type and reason): occ vebal cues for proper turning and use of RW; dec step and stride length Ambulation Distance (Feet): 36 Feet (62feet x 2 using a RW) Assistive device: Rolling walker Gait Pattern: Decreased step length - left;Decreased step length - right;Step-to pattern  Balance Balance Assessed: Yes Static Sitting Balance Static Sitting - Balance Support: Feet supported;Bilateral upper extremity supported Static Sitting - Level of Assistance: 4: Min assist Dynamic Sitting Balance Dynamic Sitting - Balance Support: Feet supported;Bilateral upper extremity supported Dynamic Sitting - Level of Assistance: 3: Mod assist Dynamic Sitting Balance - Compensations: pt lists towards R side occ; able to perform midline crossing activities with challenges for lateral/anterolateral stability Static Standing Balance Static Standing - Level of Assistance: 4: Min assist;Other (comment) (utilized a RW for UE support) Exercise  General Exercises - Lower Extremity Long Arc Quad: AROM;10 reps;Seated Hip Flexion/Marching: AROM;10 reps;Standing Shoulder Exercises Shoulder Flexion: AROM;Seated;10 reps Shoulder ABduction: AROM;10 reps;Seated End of Session PT - End of Session Equipment Utilized During Treatment: Gait belt (RW) Activity Tolerance: Patient tolerated treatment well Patient left: in chair;with call bell in reach;with family/visitor present (with chair alarm) Nurse Communication: Mobility status for transfers General Behavior During Session: [facility] for tasks performed Cognition: [facility] for tasks performed  Deon Duer, Larna Daughters 12/31/2010, 9:59 AM

## 2010-12-31 NOTE — Progress Notes (Signed)
CSW presented bed offers to pt's wife this morning who accepts bed at [facility].  She was feeling hopeful as pt ambulated with PT and appeared less confused today.  CSW notified [facility] and MD.  Awaiting stability for d/c.    Karn Cassis

## 2010-12-31 NOTE — Consult Note (Signed)
Reason for Consult: Referring Physician:   KORTNEY Young is an 77 y.o. male.  HPI:  Past Medical History  Diagnosis Date  . Stroke   . Hypertension   . Coronary artery disease 09/2004    With stenting  . Frequent falls 12/29/2010  . Dementia   . Anemia 12/29/2010  . Thrombocytopenia 12/29/2010  . Senile dementia with delirium 12/29/2010  . 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 Cerebellar artery occlusion or stenosis 12/29/2010  . Basilar artery stenosis 12/29/2010  . Thyroglossal duct cyst   . Dysphagia 12/31/2010  . Tardive dyskinesia 12/31/2010    Past Surgical History  Procedure Date  . Cardiac surgery     History reviewed. No pertinent family history.  Social History:  reports that he has never smoked. He has never used smokeless tobacco. He reports that he does not drink alcohol or use illicit drugs.  Allergies: No Known Allergies  Medications:  Prior to Admission medications   Medication Sig Start Date End Date Taking? Authorizing Provider  ALPRAZolam Prudy Feeler) 1 MG tablet Take 1 mg by mouth 2 (two) times daily.     Yes Historical Provider, MD  aspirin 81 MG tablet Take 162 mg by mouth daily.     Yes Historical Provider, MD  docusate sodium (COLACE) 100 MG capsule Take 100 mg by mouth 3 (three) times daily.     Yes Historical Provider, MD  haloperidol (HALDOL) 0.5 MG tablet Take 0.5 mg by mouth 2 (two) times daily.     Yes Historical Provider, MD  metoprolol (LOPRESSOR) 50 MG tablet Take 50 mg by mouth daily.     Yes Historical Provider, MD  OLANZapine (ZYPREXA) 5 MG tablet Take 5 mg by mouth every morning.     Yes Historical Provider, MD  omeprazole (PRILOSEC) 20 MG capsule Take 20 mg by mouth daily.     Yes Historical Provider, MD  ramipril (ALTACE) 10 MG tablet Take 10 mg by mouth daily.     Yes Historical Provider, MD  simvastatin (ZOCOR) 40 MG tablet Take 40 mg by mouth at bedtime.     Yes Historical Provider, MD      Scheduled Meds:   . aspirin  150 mg Rectal Daily  . cloNIDine  0.2 mg Transdermal Weekly  . feeding supplement  30 mL Per Tube Q2000  . LORazepam  0.5 mg Intravenous Q12H  . metoprolol  5 mg Intravenous Q4H  . pantoprazole (PROTONIX) IV  40 mg Intravenous Q48H  . DISCONTD: cloNIDine  0.1 mg Transdermal Weekly  . DISCONTD: metoprolol  5 mg Intravenous Q6H  . DISCONTD: metoprolol  5 mg Intravenous Q4H   Continuous Infusions:   . dextrose 5 % and 0.9 % NaCl with KCl 20 mEq/L 65 mL/hr at 12/31/10 1138  . feeding supplement (OSMOLITE 1.2 CAL)     PRN Meds:.acetaminophen, acetaminophen, bisacodyl, haloperidol lactate, hydrALAZINE, LORazepam, ondansetron (ZOFRAN) IV, senna-docusate, DISCONTD: LORazepam   Results for orders placed during the hospital encounter of 12/28/10 (from the past 48 hour(s))  CBC     Status: Abnormal   Collection Time   12/30/10  5:09 AM      Component Value Range Comment   WBC 5.7  4.0 - 10.5 (K/uL)    RBC 3.62 (*) 4.22 - 5.81 (MIL/uL)    Hemoglobin 11.6 (*) 13.0 - 17.0 (g/dL)    HCT 16.1 (*) 09.6 - 52.0 (%)    MCV 97.0  78.0 - 100.0 (  fL)    MCH 32.0  26.0 - 34.0 (pg)    MCHC 33.0  30.0 - 36.0 (g/dL)    RDW 62.1  30.8 - 65.7 (%)    Platelets 103 (*) 150 - 400 (K/uL)   BASIC METABOLIC PANEL     Status: Abnormal   Collection Time   12/30/10  5:09 AM      Component Value Range Comment   Sodium 136  135 - 145 (mEq/L)    Potassium 4.9  3.5 - 5.1 (mEq/L) DELTA CHECK NOTED   Chloride 103  96 - 112 (mEq/L)    CO2 26  19 - 32 (mEq/L)    Glucose, Bld 71  70 - 99 (mg/dL)    BUN 15  6 - 23 (mg/dL)    Creatinine, Ser 8.46  0.50 - 1.35 (mg/dL)    Calcium 9.2  8.4 - 10.5 (mg/dL)    GFR calc non Af Amer 78 (*) >90 (mL/min)    GFR calc Af Amer >90  >90 (mL/min)   BASIC METABOLIC PANEL     Status: Abnormal   Collection Time   12/31/10  5:25 AM      Component Value Range Comment   Sodium 137  135 - 145 (mEq/L)    Potassium 4.3  3.5 - 5.1 (mEq/L)    Chloride  104  96 - 112 (mEq/L)    CO2 27  19 - 32 (mEq/L)    Glucose, Bld 90  70 - 99 (mg/dL)    BUN 16  6 - 23 (mg/dL)    Creatinine, Ser 9.62  0.50 - 1.35 (mg/dL)    Calcium 9.3  8.4 - 10.5 (mg/dL)    GFR calc non Af Amer 65 (*) >90 (mL/min)    GFR calc Af Amer 75 (*) >90 (mL/min)     US Carotid Duplex Bilateral  12/31/2010  *RADIOLOGY REPORT*  Clinical Data: TIA, double vision, history tobacco use  BILATERAL CAROTID DUPLEX ULTRASOUND  Technique: Gray scale imaging, color Doppler and duplex ultrasound was performed of bilateral carotid and vertebral arteries in the neck.  Comparison:  12/17/2007  Criteria:  Quantification of carotid stenosis is based on velocity parameters that correlate the residual internal carotid diameter with NASCET-based stenosis levels, using the diameter of the distal internal carotid lumen as the denominator for stenosis measurement.  The following velocity measurements were obtained:                   PEAK SYSTOLIC/END DIASTOLIC RIGHT ICA:                        42/11cm/sec CCA:                        63/8cm/sec SYSTOLIC ICA/CCA RATIO:     0.69 DIASTOLIC ICA/CCA RATIO:    0.73 ECA:                        44cm/sec  LEFT ICA:                        53/12cm/sec CCA:                        74/10cm/sec SYSTOLIC ICA/CCA RATIO:     0.72 DIASTOLIC ICA/CCA RATIO:    1.20 ECA:  44cm/sec  Findings:  RIGHT CAROTID ARTERY: Tortuous right carotid system. Intimal thickening right CCA.  Mild plaque right carotid bulb into proximal right ICA, a portion of which is calcified.  Turbulent blood flow is seen at areas of tortuosity.  No high velocity jets.  Mild spectral broadening on waveform analysis.  RIGHT VERTEBRAL ARTERY:  Patent, antegrade  LEFT CAROTID ARTERY: Tortuous left left carotid system.  Intimal thickening left CCA. Small amount of plaque identified at the left carotid bulb into proximal left ICA, a portion of which is calcified shadowing.  Turbulent flow on color  Doppler imaging. Spectral broadening on waveform analysis.  No high velocity jets.  LEFT VERTEBRAL ARTERY:  Patent, antegrade  IMPRESSION: Plaque formation bilaterally at the carotid bifurcations, with velocities corresponding to a less than 50% diameter stenoses at the proximal internal carotid arteries bilaterally. No evidence of hemodynamically significant stenosis.  Original Report Authenticated By: Lollie Marrow, M.D.   Dg Chest Port 1 View  12/30/2010  *RADIOLOGY REPORT*  Clinical Data: Cough.  Chest congestion.  PORTABLE CHEST - 1 VIEW  Comparison: Prior studies dating to 12/14/2007  Findings: Heart size remains stable and within normal limits.  Mild tortuosity the thoracic aorta is again seen.  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  Both lungs are clear.  No evidence of pleural effusion.  IMPRESSION:  1.  No active lung disease. 2.  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  Original Report Authenticated By: Danae Orleans, M.D.   Dg Swallowing Func-no Report  12/30/2010  CLINICAL DATA: dysphagia   FLUOROSCOPY FOR SWALLOWING FUNCTION STUDY:  Fluoroscopy was provided for swallowing function study, which was  administered by a speech pathologist.  Final results and recommendations  from this study are contained within the speech pathology report.      Review of Systems  Constitutional: Negative.   HENT: Negative.   Eyes: Negative.   Respiratory: Negative.   Cardiovascular: Negative.   Gastrointestinal: Negative.   Genitourinary: Negative.   Musculoskeletal: Negative.   Skin: Negative.   Neurological: Positive for focal weakness and loss of consciousness.  Psychiatric/Behavioral: Negative.    Blood pressure 165/86, pulse 74, temperature 97.1 F (36.2 C), temperature source Oral, resp. rate 18, height 5\' 2"  (1.575 m), weight 49.1 kg (108 lb 3.9 oz), SpO2 98.00%. Physical  Exam  Assessment/Plan: See dictation  Amanuel Sinkfield 12/31/2010, 7:43 PM

## 2010-12-31 NOTE — Progress Notes (Signed)
Pt still remains agitated and combative - IV haldol given, but no change in pt behavior.  IV ativan given - still no change.  Pt has been seated in "geri  Chair" and has mitts placed, but pt continues to pull at IV and tele monitor.  Pt refuses 2nd attempt to place NG tube.  MD notified.  Unable to start feeding.

## 2011-01-01 ENCOUNTER — Encounter (HOSPITAL_COMMUNITY): Payer: Self-pay | Admitting: Internal Medicine

## 2011-01-01 DIAGNOSIS — G5630 Lesion of radial nerve, unspecified upper limb: Secondary | ICD-10-CM

## 2011-01-01 HISTORY — DX: Lesion of radial nerve, unspecified upper limb: G56.30

## 2011-01-01 MED ORDER — METOPROLOL TARTRATE 1 MG/ML IV SOLN
5.0000 mg | Freq: Three times a day (TID) | INTRAVENOUS | Status: DC
  Administered 2011-01-02 (×2): 5 mg via INTRAVENOUS
  Filled 2011-01-01 (×3): qty 5

## 2011-01-01 MED ORDER — OSMOLITE 1.2 CAL PO LIQD
1000.0000 mL | ORAL | Status: DC
  Administered 2011-01-01: 1000 mL via ORAL
  Administered 2011-01-02: 1000 mL
  Filled 2011-01-01 (×2): qty 1000

## 2011-01-01 NOTE — Progress Notes (Signed)
NG tube inserted to pts L nare.  Size 81F.  Pt tolerated well.  Placement auscultated by MH, RN and JB, RN.  Tube feeding started.  Pt becoming agitated, PRN ativan given.   Will continue to monitor pt.  Tolerating well so far.  MD notified.

## 2011-01-01 NOTE — Progress Notes (Signed)
Subjective: The patient is lying in bed, relatively calm currently. He was given Klonopin last night although he was made n.p.o. Apparently he tolerated the Klonopin without coughing or choking. The registered nurse tried multiple times yesterday to insert the NG tube, however, the patient was too agitated and the attempt was discontinued.  Objective: Vital signs in last 24 hours: Filed Vitals:   01/01/11 0615 01/01/11 0616 01/01/11 0826 01/01/11 0854  BP: 154/52 126/81 183/85 158/70  Pulse: 76 100 61   Temp:   97.7 F (36.5 C)   TempSrc:      Resp:   18   Height:      Weight:      SpO2:   99%     Intake/Output Summary (Last 24 hours) at 01/01/11 0941 Last data filed at 01/01/11 0800  Gross per 24 hour  Intake  682.5 ml  Output    103 ml  Net  579.5 ml    Weight change:   Physical exam: General: Alert, confused but pleasant and not agitated. Neck: Large soft mass at the base of the neck. Nontender. This is chronic according to his wife. Lungs: Clear anteriorly with decreased breath sounds in the bases. Heart: S1, S2, no murmurs rubs or gallops. Abdomen: Positive bowel sounds, soft, nontender, nondistended. Extremities: No pedal edema. Neurologic: The patient is alert. He is oriented to himself, not oriented to much else. Currently not agitated. Tremor and ataxic movement as well as wristdrop of the right upper extremity. Significant overshoot with cerebellar finger to nose testing on the right greater than the left upper extremity.  Lab Results: Basic Metabolic Panel:  Basename 12/31/10 0525 12/30/10 0509  NA 137 136  K 4.3 4.9  CL 104 103  CO2 27 26  GLUCOSE 90 71  BUN 16 15  CREATININE 1.06 0.92  CALCIUM 9.3 9.2  MG -- --  PHOS -- --   Liver Function Tests: No results found for this basename: AST:2,ALT:2,ALKPHOS:2,BILITOT:2,PROT:2,ALBUMIN:2 in the last 72 hours No results found for this basename: LIPASE:2,AMYLASE:2 in the last 72 hours No results found for  this basename: AMMONIA:2 in the last 72 hours CBC:  Basename 12/30/10 0509  WBC 5.7  NEUTROABS --  HGB 11.6*  HCT 35.1*  MCV 97.0  PLT 103*   Cardiac Enzymes: No results found for this basename: CKTOTAL:3,CKMB:3,CKMBINDEX:3,TROPONINI:3 in the last 72 hours BNP: No components found with this basename: POCBNP:3 D-Dimer: No results found for this basename: DDIMER:2 in the last 72 hours CBG: No results found for this basename: GLUCAP:6 in the last 72 hours Hemoglobin A1C: No results found for this basename: HGBA1C in the last 72 hours Fasting Lipid Panel: No results found for this basename: CHOL,HDL,LDLCALC,TRIG,CHOLHDL,LDLDIRECT in the last 72 hours Thyroid Function Tests: No results found for this basename: TSH,T4TOTAL,FREET4,T3FREE,THYROIDAB in the last 72 hours Anemia Panel: No results found for this basename: VITAMINB12,FOLATE,FERRITIN,TIBC,IRON,RETICCTPCT in the last 72 hours Coagulation: No results found for this basename: LABPROT:2,INR:2 in the last 72 hours Urine Drug Screen: Drugs of Abuse  No results found for this basename: labopia,  cocainscrnur,  labbenz,  amphetmu,  thcu,  labbarb    Alcohol Level: No results found for this basename: ETH:2 in the last 72 hours   Micro: No results found for this or any previous visit (from the past 240 hour(s)).  Studies/Results: US Carotid Duplex Bilateral  12/31/2010  *RADIOLOGY REPORT*  Clinical Data: TIA, double vision, history tobacco use  BILATERAL CAROTID DUPLEX ULTRASOUND  Technique: Wallace Cullens scale  imaging, color Doppler and duplex ultrasound was performed of bilateral carotid and vertebral arteries in the neck.  Comparison:  12/17/2007  Criteria:  Quantification of carotid stenosis is based on velocity parameters that correlate the residual internal carotid diameter with NASCET-based stenosis levels, using the diameter of the distal internal carotid lumen as the denominator for stenosis measurement.  The following velocity  measurements were obtained:                   PEAK SYSTOLIC/END DIASTOLIC RIGHT ICA:                        42/11cm/sec CCA:                        63/8cm/sec SYSTOLIC ICA/CCA RATIO:     0.69 DIASTOLIC ICA/CCA RATIO:    0.73 ECA:                        44cm/sec  LEFT ICA:                        53/12cm/sec CCA:                        74/10cm/sec SYSTOLIC ICA/CCA RATIO:     0.72 DIASTOLIC ICA/CCA RATIO:    1.20 ECA:                        44cm/sec  Findings:  RIGHT CAROTID ARTERY: Tortuous right carotid system. Intimal thickening right CCA.  Mild plaque right carotid bulb into proximal right ICA, a portion of which is calcified.  Turbulent blood flow is seen at areas of tortuosity.  No high velocity jets.  Mild spectral broadening on waveform analysis.  RIGHT VERTEBRAL ARTERY:  Patent, antegrade  LEFT CAROTID ARTERY: Tortuous left left carotid system.  Intimal thickening left CCA. Small amount of plaque identified at the left carotid bulb into proximal left ICA, a portion of which is calcified shadowing.  Turbulent flow on color Doppler imaging. Spectral broadening on waveform analysis.  No high velocity jets.  LEFT VERTEBRAL ARTERY:  Patent, antegrade  IMPRESSION: Plaque formation bilaterally at the carotid bifurcations, with velocities corresponding to a less than 50% diameter stenoses at the proximal internal carotid arteries bilaterally. No evidence of hemodynamically significant stenosis.  Original Report Authenticated By: Lollie Marrow, M.D.   Dg Chest Port 1 View  12/30/2010  *RADIOLOGY REPORT*  Clinical Data: Cough.  Chest congestion.  PORTABLE CHEST - 1 VIEW  Comparison: Prior studies dating to 12/14/2007  Findings: Heart size remains stable and within normal limits.  Mild tortuosity the thoracic aorta is again seen.  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  Both lungs are clear.  No evidence of pleural effusion.  IMPRESSION:  1.   No active lung disease. 2.  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  Original Report Authenticated By: Danae Orleans, M.D.   Dg Swallowing Func-no Report  12/30/2010  CLINICAL DATA: dysphagia   FLUOROSCOPY FOR SWALLOWING FUNCTION STUDY:  Fluoroscopy was provided for swallowing function study, which was  administered by a speech pathologist.  Final results and recommendations  from this study are contained within the speech pathology report.      Medications: I have reviewed  the patient's current medications.  Assessment: Active Problems:  CAD (coronary artery disease)  Dementia  Bradycardia  RUE weakness  Gait abnormality  Frequent falls  Anemia  Hypokalemia  Thrombocytopenia  Senile dementia with delirium  Occlusion or stenosis of posterior cerebral artery without infarction  Cerebellar artery occlusion or stenosis  Basilar artery stenosis  Thyroglossal duct cyst  Dysphagia  HTN (hypertension), malignant  Tardive dyskinesia  Radial neuropathy  1. Frequent falls at home/gait abnormality and concomitant right upper extremity weakness/tremor/wrist drop from radial neuropathy. Dr. Ronal Fear evaluation is noted and appreciated. The patient has has an element of tardive dyskinesia from being on two dopamine antagonists and radial neuropathy. Dr. Gerilyn Pilgrim started Klonopin. MRI/MRA of the brain reveals posterior circulation partial stenosis of several arteries including the superior cerebellar artery and therefore she may have progressive movement disorders from these stenotic lesions. He is certainly not a surgical or interventional candidate. He will remain on antiplatelet therapy. 2-D echocardiogram revealed preserved LV function and carotid ultrasound revealed no significant ICA stenosis. EEG pending.  Dementia with delirium. The patient is on several psychotropic medications for dementia with agitation. Now Mrs. Krygier says that Zyprexa was not discontinued.  She says it was started to stimulate his appetite in September this year.  Haldol was also started at the same time for confusion. He also takes Xanax chronically.  Severe dysphagia as noted by the speech therapy consultation/modified barium swallow. I wonder if the neck mass is impeding on his swallowing ability. Apparently it has not in the past. Based on the speech therapist evaluation, it may not be an issue. He is now n.p.o. completely. NG was tried unsuccessfully several times yesterday secondary to the patient's agitation. He may end up needing a PEG tube. The speech therapist plans to reevaluate him on Monday. He is being given dextrose in his IV fluids.  Neck mass. Per my review of his history, he has a thyroglossal duct cyst. According to Mrs. Belisle, it has enlarged some over the last 49 years of their marriage, however, it has not increased substantially over the past few years.  Thrombocytopenia of unknown etiology. It could be from Zyprexa which can cause thrombocytopenia. His vitamin B 12 and thyroid function are within normal limits.  Mild anemia. An anemia panel is surprisingly unremarkable.  Malignant hypertension. His blood pressure is elevated in part secondary to being off of his oral chronic medications. Hydralazine is ordered IV as needed. Metoprolol is being given IV. Catapres patch was started several days ago and the dose was increased to 0.2.  Bradycardia. Probably secondary to beta blocker and possibly Catapres patch. Will adjust metoprolol IV in place parameters. His thyroid function is within normal limits.  Hypokalemia, repleted.  Cough. His chest x-ray revealed no active disease.     Plan:  Continue current management. We'll asked the RN to try NG placement once more. We'll try to contact the speech therapist on call today to see if she could do a quick bedside followup evaluation before Monday     LOS: 4 days   [name] 01/01/2011, 9:41 AM

## 2011-01-01 NOTE — Progress Notes (Signed)
Nurse notified of pts HR on tele in 30s, VS obtained, BP 185/80 and pulse 61.  BP manually taken - 158/70.  MD aware, holding metoprolol for now.  Will continue to monitor.

## 2011-01-02 MED ORDER — PANTOPRAZOLE SODIUM 40 MG PO PACK
40.0000 mg | PACK | Freq: Every day | ORAL | Status: DC
  Administered 2011-01-02 – 2011-01-04 (×3): 40 mg
  Filled 2011-01-02 (×4): qty 20

## 2011-01-02 MED ORDER — METOPROLOL TARTRATE 25 MG PO TABS
12.5000 mg | ORAL_TABLET | Freq: Two times a day (BID) | ORAL | Status: DC
  Administered 2011-01-02 (×2): 12.5 mg via NASOGASTRIC
  Administered 2011-01-03 – 2011-01-04 (×2): via NASOGASTRIC
  Administered 2011-01-04 – 2011-01-05 (×3): 12.5 mg via NASOGASTRIC
  Filled 2011-01-02 (×7): qty 1

## 2011-01-02 MED ORDER — SIMVASTATIN 20 MG PO TABS
40.0000 mg | ORAL_TABLET | Freq: Every day | ORAL | Status: DC
Start: 2011-01-02 — End: 2011-01-05
  Administered 2011-01-02 – 2011-01-04 (×3): 40 mg via NASOGASTRIC
  Filled 2011-01-02 (×3): qty 2

## 2011-01-02 MED ORDER — RAMIPRIL 10 MG PO CAPS
10.0000 mg | ORAL_CAPSULE | Freq: Every day | ORAL | Status: DC
  Administered 2011-01-02 – 2011-01-05 (×4): 10 mg via NASOGASTRIC
  Filled 2011-01-02 (×4): qty 1

## 2011-01-02 NOTE — Progress Notes (Addendum)
I was notified that the patient's NG tube came out during bed change. New 12French NGT placed, assessed appropriate location, along with Lawson Fiscal, RN. Patient tolerated fair. See LDA . Sheryn Bison

## 2011-01-02 NOTE — Progress Notes (Signed)
Chart reviewed.  Subjective: No complaints. The wife is concerned about his cough. She would like to take him home eventually if possible. She is leaning against PEG tube, because she does not think that he would want one, and because her mother had one and she had a bad experience.  Objective: Vital signs in last 24 hours: Filed Vitals:   01/02/11 0016 01/02/11 0200 01/02/11 0613 01/02/11 0641  BP: 164/79 167/98 162/79 138/81  Pulse: 75 72 77 67  Temp: 97.6 F (36.4 C) 98 F (36.7 C)    TempSrc: Axillary Axillary    Resp: 16 18  20   Height:      Weight:      SpO2: 98% 97%  99%   Weight change:   Intake/Output Summary (Last 24 hours) at 01/02/11 1342 Last data filed at 01/02/11 0800  Gross per 24 hour  Intake   1570 ml  Output      0 ml  Net   1570 ml   Physical Exam: General: Cachectic. Calm, cooperative, appropriate. Answers questions. Wet cough occasionally. Neck: Large cyst anteriorly. Lungs clear to auscultation bilaterally without wheeze rhonchi or rales Cardiovascular regular rate rhythm without murmurs gallops rubs Abdomen soft nontender nondistended Extremities no clubbing cyanosis or edema Neurologic: Right wrist drop is present  Lab Results: Basic Metabolic Panel:  Lab 12/31/10 4098 12/30/10 0509 12/28/10 2300  NA 137 136 --  K 4.3 4.9 --  CL 104 103 --  CO2 27 26 --  GLUCOSE 90 71 --  BUN 16 15 --  CREATININE 1.06 0.92 --  CALCIUM 9.3 9.2 --  MG -- -- 1.9  PHOS -- -- --   Liver Function Tests:  Lab 12/28/10 2300  AST 31  ALT 40  ALKPHOS 84  BILITOT 0.2*  PROT 7.1  ALBUMIN 3.4*   No results found for this basename: LIPASE:2,AMYLASE:2 in the last 168 hours No results found for this basename: AMMONIA:2 in the last 168 hours CBC:  Lab 12/30/10 0509 12/29/10 0530 12/28/10 1645  WBC 5.7 7.1 --  NEUTROABS -- -- 3.3  HGB 11.6* 12.8* --  HCT 35.1* 39.2 --  MCV 97.0 97.5 --  PLT 103* 105* --   Cardiac Enzymes:  Lab 12/28/10 1645  CKTOTAL  80  CKMB 1.8  CKMBINDEX --  TROPONINI <0.30   BNP: No components found with this basename: POCBNP:3 D-Dimer: No results found for this basename: DDIMER:2 in the last 168 hours CBG: No results found for this basename: GLUCAP:6 in the last 168 hours Hemoglobin A1C:  Lab 12/29/10 0530  HGBA1C 5.0   Fasting Lipid Panel:  Lab 12/29/10 0530  CHOL 183  HDL 83  LDLCALC 85  TRIG 76  CHOLHDL 2.2  LDLDIRECT --   Thyroid Function Tests:  Lab 12/29/10 0851 12/28/10 2300  TSH -- 0.975  T4TOTAL -- --  FREET4 0.95 --  T3FREE -- --  THYROIDAB -- --   Coagulation: No results found for this basename: LABPROT:4,INR:4 in the last 168 hours Anemia Panel:  Lab 12/29/10 0851  VITAMINB12 640  FOLATE 12.0  FERRITIN 345*  TIBC 253  IRON 80  RETICCTPCT 1.3    Micro Results: No results found for this or any previous visit (from the past 240 hour(s)). Studies/Results:  Scheduled Meds:   . clonazePAM  0.25 mg Oral BID  . cloNIDine  0.2 mg Transdermal Weekly  . feeding supplement  30 mL Per Tube Q2000  . metoprolol  12.5 mg Per NG  tube BID  . pantoprazole sodium  40 mg Per Tube Q1200  . ramipril  10 mg Per NG tube Daily  . simvastatin  40 mg Per NG tube QHS  . DISCONTD: aspirin  150 mg Rectal Daily  . DISCONTD: metoprolol  5 mg Intravenous Q8H  . DISCONTD: pantoprazole (PROTONIX) IV  40 mg Intravenous Q48H   Continuous Infusions:   . feeding supplement (OSMOLITE 1.2 CAL) 1,000 mL (01/01/11 1429)  . DISCONTD: dextrose 5 % and 0.9 % NaCl with KCl 20 mEq/L 65 mL/hr at 01/02/11 0700  . DISCONTD: feeding supplement (OSMOLITE 1.2 CAL)     PRN Meds:.acetaminophen, bisacodyl, haloperidol lactate, hydrALAZINE, LORazepam, ondansetron (ZOFRAN) IV, senna-docusate, DISCONTD: acetaminophen Assessment/Plan: Principal Problem:  *RUE weakness Active Problems:  Gait abnormality  Frequent falls  Dysphagia  Senile dementia with delirium  Thyroglossal duct cyst  HTN (hypertension),  malignant  Tardive dyskinesia  Radial neuropathy  CAD (coronary artery disease)  Bradycardia  Anemia  Hypokalemia  Thrombocytopenia  Occlusion or stenosis of posterior cerebral artery without infarction  Basilar artery stenosis  Await speech reevaluation, though I doubt patient's wife would want a PEG tube. May need to be made DO NOT RESUSCITATE/comfort care is the oro-pharyngeal dysphasia remains severe, as he will likely get recurrent aspiration pneumonia. EEG is pending. Keep sedating medications to a minimum. Continue NG tube and tube feedings. Change medications to per G-tube. Monitor blood glucose. Discontinue IV fluid. Prognosis remains guarded.   LOS: 5 days   Hashem Goynes L 01/02/2011, 1:42 PM

## 2011-01-02 NOTE — Plan of Care (Signed)
Problem: Phase II Progression Outcomes Goal: Nutritional status adequate Outcome: Progressing TF at this time

## 2011-01-02 NOTE — Progress Notes (Signed)
Notified Dr. Lendell Caprice about the pts plan of care.  Voiced that the patient was currently getting tube feeding and IV fluids with dextrose.  Suggestion CBG checks.  Also discussed with her the patients HR being bradycardic and it looks like there is an order for metoprolol but d/c'd. After clarifying with the pharmacy there was an order d/c'd, but then a new order placed.  Will continue to give the med as prescribed.  MD says she will see him and review his chart.

## 2011-01-03 ENCOUNTER — Other Ambulatory Visit (HOSPITAL_COMMUNITY): Payer: Medicare Other

## 2011-01-03 ENCOUNTER — Inpatient Hospital Stay (HOSPITAL_COMMUNITY): Payer: Medicare Other

## 2011-01-03 MED ORDER — SODIUM CHLORIDE 0.9 % IJ SOLN
INTRAMUSCULAR | Status: AC
  Filled 2011-01-03: qty 3

## 2011-01-03 NOTE — Plan of Care (Signed)
Problem: Swallowing Difficulty (McGrath-1.1) Goal: Food and/or nutrient delivery Individualized approach for food/nutrient provision.  Outcome: Progressing Pt TF held currently for SLP re-evaluation which is to be completed at 1130 today. Wife says that she would like to take him home if possible today. Will follow for results of swallow study for further recommendations.

## 2011-01-03 NOTE — Progress Notes (Signed)
Physical Therapy Treatment Patient Details Name: [name] MRN: [id number] DOB: 1929-12-09 Today's Date: 01/03/2011  PT Assessment/Plan  Pt very pleasant to work with but did require multimodal cueing for proper tech to reduce risk of falling, proper gait  PT Goals  Acute Rehab PT Goals PT Goal Formulation: With patient/family Time For Goal Achievement: 2 weeks Pt will go Supine/Side to Sit: with modified independence;with HOB 0 degrees Pt will Sit at [facility] of Bed: with mod assist;3-5 min;with no upper extremity support Pt will go Sit to Stand: with max assist  PT Treatment Precautions/Restrictions  Precautions Precautions: Fall Required Braces or Orthoses: No Restrictions Weight Bearing Restrictions: No Mobility (including Balance) Bed Mobility Bed Mobility: Yes Rolling Left: 5: Supervision;4: Min assist Rolling Left Details (indicate cue type and reason): vcing for hand placement Left Sidelying to Sit: 4: Min assist Supine to Sit: 5: Supervision;4: Min assist Sitting - Scoot to [place] of Bed: 4: Min assist Transfers Transfers: Yes Sit to Stand: 4: Min assist Sit to Stand Details (indicate cue type and reason): vc-ing for proper sequence (push from chair from bed, reach with arms for chair, feel legs on back of legs before sitting.) Ambulation/Gait Ambulation/Gait: Yes Ambulation/Gait Assistance: 4: Min assist Ambulation/Gait Assistance Details (indicate cue type and reason): cueing for gait sequence, increase stride length Ambulation Distance (Feet): 38 Feet Assistive device: Rolling walker Gait Pattern: Decreased step length - right;Trunk flexed    Exercise  General Exercises - Lower Extremity Ankle Circles/Pumps: AROM;Both;10 reps Long Arc Quad: Both;10 reps Hip ABduction/ADduction: AROM;Both;10 reps End of Session PT - End of Session Equipment Utilized During Treatment: Gait belt Activity Tolerance: Patient tolerated treatment well Patient left: in  chair;with call bell in reach;with family/visitor present General Behavior During Session: [facility] for tasks performed Cognition: [facility] for tasks performed  Juel Burrow 01/03/2011, 9:34 AM

## 2011-01-03 NOTE — Procedures (Signed)
Modified Barium Swallow Procedure Note Patient Details  Name: [name] MRN: [id number] Date of Birth: [birth year]  Today's Date: 01/03/2011 Time:  - 11:30    Past Medical History:  Past Medical History  Diagnosis Date  . Stroke   . Hypertension   . Coronary artery disease 09/2004    With stenting  . Frequent falls 12/29/2010  . Dementia   . Anemia 12/29/2010  . Thrombocytopenia 12/29/2010  . Senile dementia with delirium 12/29/2010  . 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 Cerebellar artery occlusion or stenosis 12/29/2010  . Basilar artery stenosis 12/29/2010  . Thyroglossal duct cyst   . Dysphagia 12/31/2010  . Tardive dyskinesia 12/31/2010  . Radial neuropathy 01/01/2011   Past Surgical History:  Past Surgical History  Procedure Date  . Cardiac surgery    HPI:   See previous BSE.  Recommendation/Prognosis  Clinical Impression Dysphagia Diagnosis: Mild oral phase dysphagia;Severe oral phase dysphagia Clinical impression: Severe pharyngeal phase dysphagia persists (min oral), however he has shown improvement since study on Thursday. Delay in swallow initiation, decreased pharyngeal pressure with poor bolus clearance through UES resulting in moderate/severe pharyngeal residue (posterior pharyngeal wall and pyriforms) with puree with trace tracheal aspiration after the swallow with puree and thins. Pt tolerated small cup sips nectar the best as this had the least amounth of pharyngeal residue. Pt is at high risk for continued aspiration, however family does not want a feeding tube and wish to pursue with the "safest diet" tolerated acknowledging aspiration risk. Recommend nectar-thick liquid diet only (can thin out puree at home) and home health speech therapy for pharyngeal strengthening exercises and trials of puree. Pt may have ice chips between meals after aggressive oral care. Recommendations General Recommendation: Ice  chips PRN after oral care Solid Consistency: No solids, see liquids Liquid Consistency: Nectar Liquid Administration via: Cup Medication Administration:  (crushed as able in nectars or puree if needed) Supervision: Full supervision/cueing for compensatory strategies Compensations: Slow rate;Small sips/bites;Multiple dry swallows after each bite/sip Postural Changes and/or Swallow Maneuvers: Out of bed for meals;Seated upright 90 degrees;Upright 30-60 min after meal Oral Care Recommendations: Oral care QID;Staff/trained caregiver to provide oral care Other Recommendations: Order thickener from pharmacy;Prohibited food (jello, ice cream, thin soups) Follow up Recommendations: Home health SLP (Pt wishes to go home with his wife) Prognosis Prognosis for Safe Diet Advancement: Guarded Barriers to Reach Goals: Severity of dysphagia;Motivation Individuals Consulted Consulted and Agree with Results and Recommendations: Patient;Family member/caregiver;RN;MD Family Member Consulted: Wife   General:  Date of Onset: 12/29/10 HPI: 77 yo male admitted s/p fall on Tuesday with likely CVA. He had acute onset dysphagia yesterday at lunch and now shows postive signs/symptoms aspiration on all consistencies. He had a MBSS completed by me in May 2011 and was placed on a regular diet with thin liquids. At that time, minimal pyriform residuals were noted but were cleared with repeat swallow. His wife accompanied him to the MBSS today. Type of Study: Repeat MBS Diet Prior to this Study: NPO Temperature Spikes Noted: No Respiratory Status: Room air History of Intubation: No Behavior/Cognition: Alert;Cooperative;Pleasant mood;Requires cueing Oral Cavity - Dentition: Adequate natural dentition (missing molars) Oral Motor / Sensory Function: Impaired - see Bedside swallow eval Vision: Functional for self-feeding Patient Positioning: Upright in chair Baseline Vocal Quality: Low vocal intensity (seems to have  difficulty with secretions) Volitional Cough: Weak Volitional Swallow: Able to elicit Anatomy: Other (Comment) (Pt has goiter that he's apparantly had  all his life) Pharyngeal Secretions: Not observed secondary MBS Ice chips: Not tested  Reason for Referral:  Objectively evaluate swallow function due to concerns of possible aspiration and identify appropriate diet and compensatory strategies as needed.  Oral Phase Oral Preparation/Oral Phase Oral Phase: Impaired Oral - Solids Oral - Puree: Weak lingual manipulation;Delayed oral transit  Pharyngeal Phase  Pharyngeal Phase Pharyngeal Phase: Impaired Pharyngeal - Nectar Pharyngeal - Nectar Teaspoon: Not tested Pharyngeal - Nectar Cup: Delayed swallow initiation;Premature spillage to pyriform;Reduced pharyngeal peristalsis;Pharyngeal residue - pyriform;Lateral channel residue;Compensatory strategies attempted (Comment) (small sips with repeat dry swallows) Pharyngeal - Thin Pharyngeal - Thin Teaspoon: Not tested Pharyngeal - Thin Cup: Delayed swallow initiation;Premature spillage to pyriform;Penetration/Aspiration after swallow;Trace aspiration;Pharyngeal residue - pyriform;Lateral channel residue;Inter-arytenoid space residue;Pharyngeal residue - cp segment Penetration/Aspiration details (thin cup): Material enters airway, passes BELOW cords then ejected out (pt required cuing to clear aspirate) Pharyngeal - Thin Straw: Not tested Pharyngeal - Solids Pharyngeal - Puree: Premature spillage to valleculae;Delayed swallow initiation;Reduced pharyngeal peristalsis;Penetration/Aspiration after swallow;Trace aspiration;Pharyngeal residue - posterior pharnyx;Pharyngeal residue - pyriform;Lateral channel residue;Pharyngeal residue - cp segment Penetration/Aspiration details (puree): Material enters airway, passes BELOW cords then ejected out Pharyngeal Phase - Comment Pharyngeal Comment: Significant pharyngeal residue along posterior pharyngeal  wall with puree.  Cervical Esophageal Phase  Cervical Esophageal Phase - Solids Puree: Reduced cricopharyngeal relaxation     Kamylah Manzo 01/03/2011, 2:50 PM

## 2011-01-04 MED ORDER — SODIUM CHLORIDE 0.9 % IJ SOLN: 3.0000 mL | INTRAMUSCULAR | Status: DC | PRN

## 2011-01-04 MED ORDER — SODIUM CHLORIDE 0.9 % IJ SOLN
3.0000 mL | Freq: Two times a day (BID) | INTRAMUSCULAR | Status: DC
  Administered 2011-01-04 (×2): 3 mL via INTRAVENOUS
  Filled 2011-01-04 (×2): qty 3

## 2011-01-04 MED ORDER — MEGESTROL ACETATE 400 MG/10ML PO SUSP
400.0000 mg | Freq: Every day | ORAL | Status: DC
  Administered 2011-01-04 – 2011-01-05 (×2): 400 mg via ORAL
  Filled 2011-01-04 (×2): qty 10

## 2011-01-04 MED ORDER — SODIUM CHLORIDE 0.9 % IV SOLN: 250.0000 mL | INTRAVENOUS | Status: DC | PRN

## 2011-01-04 NOTE — Progress Notes (Signed)
Discussed with speech therapy.  Subjective: No complaints Objective: Vital signs in last 24 hours: Filed Vitals:   01/03/11 1412 01/03/11 1440 01/03/11 2300 01/04/11 0628  BP: 145/73  156/78 173/80  Pulse: 68 63  67  Temp: 98.3 F (36.8 C)   97.7 F (36.5 C)  TempSrc: Oral   Oral  Resp: 18   16  Height:      Weight:      SpO2: 100% 99%  94%   Weight change:   Intake/Output Summary (Last 24 hours) at 01/04/11 0834 Last data filed at 01/03/11 2000  Gross per 24 hour  Intake      0 ml  Output    100 ml  Net   -100 ml   Physical Exam: General: Cachectic. Calm, cooperative, appropriate. Answers questions. Sitting in a chair. NG tube out. No longer coughing. Neck: Large cyst anteriorly. Lungs clear to auscultation bilaterally without wheeze rhonchi or rales Cardiovascular regular rate rhythm without murmurs gallops rubs Abdomen soft nontender nondistended Extremities no clubbing cyanosis or edema Neurologic: Right wrist drop is present  Lab Results: Basic Metabolic Panel:  Lab 12/31/10 1914 12/30/10 0509 12/28/10 2300  NA 137 136 --  K 4.3 4.9 --  CL 104 103 --  CO2 27 26 --  GLUCOSE 90 71 --  BUN 16 15 --  CREATININE 1.06 0.92 --  CALCIUM 9.3 9.2 --  MG -- -- 1.9  PHOS -- -- --   Liver Function Tests:  Lab 12/28/10 2300  AST 31  ALT 40  ALKPHOS 84  BILITOT 0.2*  PROT 7.1  ALBUMIN 3.4*   No results found for this basename: LIPASE:2,AMYLASE:2 in the last 168 hours No results found for this basename: AMMONIA:2 in the last 168 hours CBC:  Lab 12/30/10 0509 12/29/10 0530 12/28/10 1645  WBC 5.7 7.1 --  NEUTROABS -- -- 3.3  HGB 11.6* 12.8* --  HCT 35.1* 39.2 --  MCV 97.0 97.5 --  PLT 103* 105* --   Cardiac Enzymes:  Lab 12/28/10 1645  CKTOTAL 80  CKMB 1.8  CKMBINDEX --  TROPONINI <0.30   BNP: No components found with this basename: POCBNP:3 D-Dimer: No results found for this basename: DDIMER:2 in the last 168 hours CBG: No results found for  this basename: GLUCAP:6 in the last 168 hours Hemoglobin A1C:  Lab 12/29/10 0530  HGBA1C 5.0   Fasting Lipid Panel:  Lab 12/29/10 0530  CHOL 183  HDL 83  LDLCALC 85  TRIG 76  CHOLHDL 2.2  LDLDIRECT --   Thyroid Function Tests:  Lab 12/29/10 0851 12/28/10 2300  TSH -- 0.975  T4TOTAL -- --  FREET4 0.95 --  T3FREE -- --  THYROIDAB -- --   Coagulation: No results found for this basename: LABPROT:4,INR:4 in the last 168 hours Anemia Panel:  Lab 12/29/10 0851  VITAMINB12 640  FOLATE 12.0  FERRITIN 345*  TIBC 253  IRON 80  RETICCTPCT 1.3    Micro Results: No results found for this or any previous visit (from the past 240 hour(s)). Studies/Results:  Scheduled Meds:    . clonazePAM  0.25 mg Oral BID  . cloNIDine  0.2 mg Transdermal Weekly  . metoprolol  12.5 mg Per NG tube BID  . pantoprazole sodium  40 mg Per Tube Q1200  . ramipril  10 mg Per NG tube Daily  . simvastatin  40 mg Per NG tube QHS  . sodium chloride      . sodium chloride      .  DISCONTD: feeding supplement  30 mL Per Tube Q2000   Continuous Infusions:    . DISCONTD: feeding supplement (OSMOLITE 1.2 CAL) 1,000 mL (01/02/11 1910)   PRN Meds:.acetaminophen, bisacodyl, haloperidol lactate, hydrALAZINE, LORazepam, ondansetron (ZOFRAN) IV, senna-docusate Assessment/Plan: Principal Problem:  *RUE weakness Active Problems:  Gait abnormality  Frequent falls  Dysphagia  Senile dementia with delirium  Thyroglossal duct cyst  HTN (hypertension), malignant  Tardive dyskinesia  Radial neuropathy  CAD (coronary artery disease)  Bradycardia  Anemia  Hypokalemia  Thrombocytopenia  Occlusion or stenosis of posterior cerebral artery without infarction  Basilar artery stenosis  Had a long discussion with the wife and the daughter separately. Patient will be started on the safest diet. His prognosis remains guarded. He started lost a lot of weight according to his wife. He may continue to decline. The  wife voices understanding. She reports that he would not want to be resuscitated. She is refusing placement at this time. Will monitor him on a nectar thick liquid diet and possibly discharge with outpatient EEG soon. Patient was made DO NOT RESUSCITATE. Patient will need home physical therapy, occupational therapy, nurse and speech therapy. Depending on how he does, he may soon be hospice appropriate.   LOS: 7 days   Ingrid Shifrin L 01/04/2011, 8:34 AM

## 2011-01-04 NOTE — Progress Notes (Signed)
Discussed with speech therapy.  Subjective: No complaints Objective: Vital signs in last 24 hours: Filed Vitals:   01/03/11 1440 01/03/11 2300 01/04/11 0628 01/04/11 1230  BP:  156/78 173/80 156/75  Pulse: 63  67 56  Temp:   97.7 F (36.5 C) 97.6 F (36.4 C)  TempSrc:   Oral Oral  Resp:   16 18  Height:      Weight:      SpO2: 99%  94% 95%   Weight change:   Intake/Output Summary (Last 24 hours) at 01/04/11 1636 Last data filed at 01/04/11 1200  Gross per 24 hour  Intake    120 ml  Output    100 ml  Net     20 ml   Physical Exam: General: Cachectic. Calm, cooperative, appropriate. Answers questions. Sitting in a chair. NG tube out. No longer coughing. Neck: Large cyst anteriorly. Lungs clear to auscultation bilaterally without wheeze rhonchi or rales Cardiovascular regular rate rhythm without murmurs gallops rubs Abdomen soft nontender nondistended Extremities no clubbing cyanosis or edema Neurologic: Right wrist drop is present  Lab Results: Basic Metabolic Panel:  Lab 12/31/10 1610 12/30/10 0509 12/28/10 2300  NA 137 136 --  K 4.3 4.9 --  CL 104 103 --  CO2 27 26 --  GLUCOSE 90 71 --  BUN 16 15 --  CREATININE 1.06 0.92 --  CALCIUM 9.3 9.2 --  MG -- -- 1.9  PHOS -- -- --   Liver Function Tests:  Lab 12/28/10 2300  AST 31  ALT 40  ALKPHOS 84  BILITOT 0.2*  PROT 7.1  ALBUMIN 3.4*   No results found for this basename: LIPASE:2,AMYLASE:2 in the last 168 hours No results found for this basename: AMMONIA:2 in the last 168 hours CBC:  Lab 12/30/10 0509 12/29/10 0530 12/28/10 1645  WBC 5.7 7.1 --  NEUTROABS -- -- 3.3  HGB 11.6* 12.8* --  HCT 35.1* 39.2 --  MCV 97.0 97.5 --  PLT 103* 105* --   Cardiac Enzymes:  Lab 12/28/10 1645  CKTOTAL 80  CKMB 1.8  CKMBINDEX --  TROPONINI <0.30   BNP: No components found with this basename: POCBNP:3 D-Dimer: No results found for this basename: DDIMER:2 in the last 168 hours CBG: No results found for  this basename: GLUCAP:6 in the last 168 hours Hemoglobin A1C:  Lab 12/29/10 0530  HGBA1C 5.0   Fasting Lipid Panel:  Lab 12/29/10 0530  CHOL 183  HDL 83  LDLCALC 85  TRIG 76  CHOLHDL 2.2  LDLDIRECT --   Thyroid Function Tests:  Lab 12/29/10 0851 12/28/10 2300  TSH -- 0.975  T4TOTAL -- --  FREET4 0.95 --  T3FREE -- --  THYROIDAB -- --   Coagulation: No results found for this basename: LABPROT:4,INR:4 in the last 168 hours Anemia Panel:  Lab 12/29/10 0851  VITAMINB12 640  FOLATE 12.0  FERRITIN 345*  TIBC 253  IRON 80  RETICCTPCT 1.3    Micro Results: No results found for this or any previous visit (from the past 240 hour(s)). Studies/Results:  Scheduled Meds:    . clonazePAM  0.25 mg Oral BID  . cloNIDine  0.2 mg Transdermal Weekly  . metoprolol  12.5 mg Per NG tube BID  . pantoprazole sodium  40 mg Per Tube Q1200  . ramipril  10 mg Per NG tube Daily  . simvastatin  40 mg Per NG tube QHS  . sodium chloride  3 mL Intravenous Q12H  . sodium chloride      .  sodium chloride       Continuous Infusions:   PRN Meds:.sodium chloride, acetaminophen, bisacodyl, haloperidol lactate, hydrALAZINE, LORazepam, ondansetron (ZOFRAN) IV, senna-docusate, sodium chloride Assessment/Plan: Principal Problem:  *RUE weakness Active Problems:  Gait abnormality  Frequent falls  Dysphagia  Senile dementia with delirium  Thyroglossal duct cyst  HTN (hypertension), malignant  Tardive dyskinesia  Radial neuropathy  CAD (coronary artery disease)  Bradycardia  Anemia  Hypokalemia  Thrombocytopenia  Occlusion or stenosis of posterior cerebral artery without infarction  Basilar artery stenosis  Hasn't eaten much today. Spoke with the wife that unless his appetite and intake improves, palliative care and hospice would be most appropriate. She voices understanding. We'll monitor input for another day and add Megace. Reaching maximal hospital benefit.   LOS: 7 days    Lavetta Geier L 01/04/2011, 4:36 PM

## 2011-01-05 ENCOUNTER — Other Ambulatory Visit (HOSPITAL_COMMUNITY): Payer: Medicare Other

## 2011-01-05 DIAGNOSIS — R6251 Failure to thrive (child): Secondary | ICD-10-CM | POA: Diagnosis present

## 2011-01-05 MED ORDER — CLONAZEPAM 0.5 MG PO TABS: 0.2500 mg | ORAL_TABLET | Freq: Two times a day (BID) | ORAL | Status: DC

## 2011-01-05 MED ORDER — METOPROLOL TARTRATE 50 MG PO TABS: 25.0000 mg | ORAL_TABLET | Freq: Every day | ORAL | Status: DC

## 2011-01-05 MED ORDER — MEGESTROL ACETATE 400 MG/10ML PO SUSP: 400.0000 mg | Freq: Every day | ORAL | Status: DC

## 2011-01-05 NOTE — Progress Notes (Signed)
Patient d/c home with family, and also home health Left floor via wheelchair accompanied by staff  No c/o pain at d/c D/c instructions reviewed  with wife,verbalized understanding of d/c instructions, Rx's and follow up appt. Also review with wife about nectar thick diet she verbalized understanding Jeryn Bertoni, Kae Heller

## 2011-01-05 NOTE — Progress Notes (Signed)
CARE MANAGEMENT NOTE 01/05/2011  Patient:  [name], [name]   Account Number:  [id number]  Date Initiated:  12/29/2010  Documentation initiated by:  Andi Devon Assessment:   77 yr old male admitted from home with frequent falls and dementia lives with his wife.     Action/Plan:   Anticipated DC Date:  01/05/2011   Anticipated DC Plan:  HOME W HOME HEALTH SERVICES      DC Planning Services  CM consult      Choice offered to / List presented to:  C-3 Spouse        HH arranged  HH-1 RN  HH-10 DISEASE MANAGEMENT  HH-2 PT  HH-3 OT  HH-4 NURSE'S AIDE  HH-6 SOCIAL WORKER      HH agency  [facility]   Status of service:  Completed, signed off Medicare Important Message given?   (If response is "NO", the following Medicare IM given date fields will be blank) Date Medicare IM given:   Date Additional Medicare IM given:    Discharge Disposition:  HOME W HOME HEALTH SERVICES  Per UR Regulation:    Comments:  01/05/11 1100 Geneva Bolden RN  Spouse has decided to take the pt home with HH< and choses [facility]. This is set up for all services available to give as much support as possible

## 2011-01-05 NOTE — Discharge Summary (Addendum)
Physician Discharge Summary  Patient ID: [name] MRN: [id number] DOB/AGE: October 27, 1929 77 y.o.  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 Qty: 30 tablet, Refills: 0    megestrol (MEGACE) 400 MG/10ML suspension Take 10 mLs (400 mg total) by mouth daily. Qty: 240 mL, Refills: 0      CONTINUE these medications which have NOT CHANGED   Details  aspirin 81 MG tablet Take 162 mg by mouth daily.      docusate sodium (COLACE) 100 MG capsule Take 100 mg by mouth 3 (three) times daily.      metoprolol (LOPRESSOR) 50 MG tablet Take 50 mg by mouth daily.      omeprazole (PRILOSEC) 20 MG capsule Take 20 mg by mouth daily.      ramipril (ALTACE) 10 MG tablet Take 10 mg by mouth daily.      simvastatin (ZOCOR) 40 MG tablet Take 40 mg by mouth at bedtime.        STOP taking these medications     ALPRAZolam (XANAX) 1 MG tablet      haloperidol (HALDOL) 0.5 MG tablet      OLANZapine (ZYPREXA) 5 MG tablet         Discharge Orders    Future Appointments: Provider: Department: Dept Phone: Center:   01/05/2011 4:00 PM [facility]  None     Future Orders Please Complete By Expires   Discharge instructions      Comments:   Nectar thickened liquids and ice chips only.  Crush all pills   Walk with assistance         Follow-up  Information    Follow up with [facility] in 2 weeks.        followup with Dr. Gerilyn Pilgrim. Please note that the EEG has not been done as an inpatient.  Disposition: Home or Self Care with home physical therapy, occupational therapy, nurse, aid, speech therapist  Discharged Condition:   Consults: Speech therapy, physical therapy  Beryle Beams, MD  Labs:       Sodium      141 136 137      Potassium      3.6 4.9  4.3      Chloride      103 103 104      CO2      29 26 27       Mean Plasma Glucose      97        BUN      13 15 16       Creatinine, Ser      0.83 0.92 1.06      Calcium      9.9 9.2 9.3      GFR calc non Af Amer      82 78 65      GFR calc Af Amer        75  Glucose, Bld      83 71 90      Magnesium      1.9        Alkaline Phosphatase      84        Albumin      3.4        AST      31        ALT      40        Total Protein      7.1        Bilirubin, Direct      <0.1        Indirect Bilirubin      NOT CALCULATED        Total Bilirubin      0.2         CARDIAC PROFILE    CK, MB      1.8        Total CK      80        Troponin I      <0.30          LIPID PROFILE    Cholesterol      183        Triglycerides      76        HDL      83        LDL Cholesterol      85         VLDL      15        Total CHOL/HDL Ratio      2.2         IRON /ANEMIA PROFILE    Iron       80       UIBC       173       TIBC       253       Saturation Ratios       32       Ferritin       345       Folate               OTHER CHEM    Vitamin B-12       640        PROTEIN ELP    Total Protein      7.1         CBC    WBC      7.1 5.7       RBC      4.02 3.62       Hemoglobin      12.8 11.6       HCT      39.2 35.1       MCV      97.5 97.0       MCH      31.8 32.0       MCHC      32.7 33.0       RDW      12.3 12.4       Platelets      105 103        DIFFERENTIAL    Neutrophils Relative      56        Lymphocytes Relative      24        Monocytes  Relative  19        Eosinophils Relative      1        Basophils Relative      1        Neutro Abs      3.3        Lymphs Abs      1.4        Monocytes Absolute      1.1        Eosinophils Absolute      0.1        Basophils Absolute      0.0        RBC.       4.11       Retic Ct Pct       1.3       Retic Count, Manual       53.4        DIABETES    Hemoglobin A1C               Glucose, Bld      83 71 90       THYROID    TSH      0.975        Free T4       0.95        URINALYSIS    Color, Urine      YELLOW        APPearance      HAZY        Specific Gravity, Urine      1.030">1.030        pH      6.0        Glucose, UA      NEGATIVE        Bilirubin Urine      NEGATIVE        Ketones, ur      NEGATIVE        Protein, ur      TRACE        Urobilinogen, UA      0.2        Nitrite      NEGATIVE        Leukocytes, UA      NEGATIVE        Urine-Other      MUCOUS PRESENT        WBC, UA      0-2        RBC / HPF      0-2        Squamous Epithelial / LPF      RARE        Bacteria, UA      RARE         Diagnostics:  Dg Pelvis 1-2 Views  12/28/2010  *RADIOLOGY REPORT*  Clinical Data: Multiple falls, bilateral hip tenderness  PELVIS - 1-2 VIEW  Comparison: CT abdomen pelvis dated 09/03/2010  Findings: No fracture or dislocation is seen.  Bilateral hip joint spaces are symmetric.  Mild degenerative changes of the lower lumbar spine.  IMPRESSION: No fracture or dislocation is seen.  Original Report Authenticated By: Charline Bills, M.D.   Ct Head Wo Contrast  12/28/2010  *RADIOLOGY REPORT*  Clinical Data: Fall, right hand numbness, leg weakness, history stroke, hypertension, coronary artery disease  CT HEAD WITHOUT CONTRAST  Technique:  Contiguous axial images were obtained from the base of the skull through the vertex without contrast.  Comparison: 03/13/2010  Findings: Generalized atrophy. Normal ventricular morphology. No midline shift or mass effect. Small vessel chronic  ischemic changes of deep cerebral white matter. No intracranial hemorrhage, mass lesion, or acute infarction. Visualized paranasal sinuses and mastoid air cells clear. Bones unremarkable.  IMPRESSION: Atrophy with small vessel chronic ischemic changes of deep cerebral white matter. No acute intracranial abnormalities.  Original Report Authenticated By: Lollie Marrow, M.D.   Mr Angiogram Head Wo Contrast  12/29/2010  *RADIOLOGY REPORT*  Clinical Data: *RADIOLOGY REPORT*  Clinical Data:  Fall.  Right hand numbness and bilateral leg weakness.  History of prior stroke.  MRI HEAD WITHOUT CONTRAST MRA HEAD WITHOUT CONTRAST  Technique: Multiplanar, multiecho pulse sequences of the brain and surrounding structures were obtained according to standard protocol without intravenous contrast.  Angiographic images of the head were obtained using MRA technique without contrast.  Comparison: 12/28/2010 CT.  12/14/2007 MR.  MRI HEAD  Findings:  Motion degraded exam.  No acute infarct.  No intracranial hemorrhage.  No intracranial mass lesion detected on this unenhanced motion degraded exam.  Prominent small vessel disease type changes.  Global atrophy.  Ventricular prominence probably related to atrophy rather than hydrocephalus having progressed slightly since prior exam.  C2-3 cervical spondylotic changes with mild spinal stenosis.  Paranasal sinus mucosal thickening most notable right maxillary sinus.  IMPRESSION: Motion degraded exam.  No acute infarct.  Prominent small vessel disease type changes.  Global atrophy with ventricular prominence as noted above.  MRA HEAD  Findings: Motion degraded exam.  Grading stenosis or evaluating for aneurysm is markedly limited on the present exam secondary to the motion.  What can be stated with certainty is that there is flow within portions of the right vertebral artery, basilar artery and internal carotid arteries.  Nonvisualization left vertebral artery and right middle cerebral  artery branches.  Suggestion of moderate to marked stenosis involve portions of the; proximal basilar artery, posterior cerebral artery and superior cerebellar artery bilaterally, cavernous segment internal carotid artery bilaterally, A1 segment and A2 segment of the anterior cerebral artery bilaterally, left middle cerebral bifurcation and left middle cerebral artery branch vessels.  IMPRESSION: Motion degraded exam as discussed above.  Original Report Authenticated By: Fuller Canada, M.D.   Mr Brain Wo Contrast  12/29/2010  *RADIOLOGY REPORT*  Clinical Data: *RADIOLOGY REPORT*  Clinical Data:  Fall.  Right hand numbness and bilateral leg weakness.  History of prior stroke.  MRI HEAD WITHOUT CONTRAST MRA HEAD WITHOUT CONTRAST  Technique: Multiplanar, multiecho pulse sequences of the brain and surrounding structures were obtained according to standard protocol without intravenous contrast.  Angiographic images of the head were obtained using MRA technique without contrast.  Comparison: 12/28/2010 CT.  12/14/2007 MR.  MRI HEAD  Findings:  Motion degraded exam.  No acute infarct.  No intracranial hemorrhage.  No intracranial mass lesion detected on this unenhanced motion degraded exam.  Prominent small vessel disease type changes.  Global atrophy.  Ventricular prominence probably related to atrophy rather than hydrocephalus having progressed slightly since prior exam.  C2-3 cervical spondylotic changes with mild spinal stenosis.  Paranasal sinus mucosal thickening most notable right maxillary sinus.  IMPRESSION: Motion degraded exam.  No acute infarct.  Prominent small vessel disease type changes.  Global atrophy with ventricular prominence as noted above.  MRA HEAD  Findings: Motion degraded exam.  Grading stenosis or evaluating for aneurysm is markedly limited on the present exam secondary to the motion.  What can be stated with certainty is that  there is flow within portions of the right vertebral artery,  basilar artery and internal carotid arteries.  Nonvisualization left vertebral artery and right middle cerebral artery branches.  Suggestion of moderate to marked stenosis involve portions of the; proximal basilar artery, posterior cerebral artery and superior cerebellar artery bilaterally, cavernous segment internal carotid artery bilaterally, A1 segment and A2 segment of the anterior cerebral artery bilaterally, left middle cerebral bifurcation and left middle cerebral artery branch vessels.  IMPRESSION: Motion degraded exam as discussed above.  Original Report Authenticated By: Fuller Canada, M.D.   US Carotid Duplex Bilateral  12/31/2010  *RADIOLOGY REPORT*  Clinical Data: TIA, double vision, history tobacco use  BILATERAL CAROTID DUPLEX ULTRASOUND  Technique: Gray scale imaging, color Doppler and duplex ultrasound was performed of bilateral carotid and vertebral arteries in the neck.  Comparison:  12/17/2007  Criteria:  Quantification of carotid stenosis is based on velocity parameters that correlate the residual internal carotid diameter with NASCET-based stenosis levels, using the diameter of the distal internal carotid lumen as the denominator for stenosis measurement.  The following velocity measurements were obtained:                   PEAK SYSTOLIC/END DIASTOLIC RIGHT ICA:                        42/11cm/sec CCA:                        63/8cm/sec SYSTOLIC ICA/CCA RATIO:     0.69 DIASTOLIC ICA/CCA RATIO:    0.73 ECA:                        44cm/sec  LEFT ICA:                        53/12cm/sec CCA:                        74/10cm/sec SYSTOLIC ICA/CCA RATIO:     0.72 DIASTOLIC ICA/CCA RATIO:    1.20 ECA:                        44cm/sec  Findings:  RIGHT CAROTID ARTERY: Tortuous right carotid system. Intimal thickening right CCA.  Mild plaque right carotid bulb into proximal right ICA, a portion of which is calcified.  Turbulent blood flow is seen at areas of tortuosity.  No high velocity jets.  Mild  spectral broadening on waveform analysis.  RIGHT VERTEBRAL ARTERY:  Patent, antegrade  LEFT CAROTID ARTERY: Tortuous left left carotid system.  Intimal thickening left CCA. Small amount of plaque identified at the left carotid bulb into proximal left ICA, a portion of which is calcified shadowing.  Turbulent flow on color Doppler imaging. Spectral broadening on waveform analysis.  No high velocity jets.  LEFT VERTEBRAL ARTERY:  Patent, antegrade  IMPRESSION: Plaque formation bilaterally at the carotid bifurcations, with velocities corresponding to a less than 50% diameter stenoses at the proximal internal carotid arteries bilaterally. No evidence of hemodynamically significant stenosis.  Original Report Authenticated By: Lollie Marrow, M.D.   Dg Chest Port 1 View  12/30/2010  *RADIOLOGY REPORT*  Clinical Data: Cough.  Chest congestion.  PORTABLE CHEST - 1 VIEW  Comparison: Prior studies dating to 12/14/2007  Findings: Heart size remains stable and within normal limits.  Mild tortuosity the  thoracic aorta is again seen.  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  Both lungs are clear.  No evidence of pleural effusion.  IMPRESSION:  1.  No active lung disease. 2.  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  Original Report Authenticated By: Danae Orleans, M.D.   Dg Swallowing Func-no Report  01/03/2011  CLINICAL DATA: dysphagia   FLUOROSCOPY FOR SWALLOWING FUNCTION STUDY:  Fluoroscopy was provided for swallowing function study, which was  administered by a speech pathologist.  Final results and recommendations  from this study are contained within the speech pathology report.     Dg Swallowing Func-no Report  12/30/2010  CLINICAL DATA: dysphagia   FLUOROSCOPY FOR SWALLOWING FUNCTION STUDY:  Fluoroscopy was provided for swallowing function study, which was  administered by a speech pathologist.  Final results and  recommendations  from this study are contained within the speech pathology report.      Procedures: None  EKG: Marked sinus bradycardia Left ventricular hypertrophy Abnormal ECG When compared with ECG of 04-Sep-2010 01:52,  DNR   Hospital Course: See H&P for complete admission details. The patient is an elderly black male with a history of previous stroke and dementia who presented with right-sided weakness. He has residual right-sided weakness and dysphasia from previous stroke but it had worsened and he was falling a lot recently. On physical exam he did have right hand weakness. Workup was negative for acute stroke. He had apparently had some behavioral problems and was started on Zyprexa recently. He also is getting Haldol. Stroke workup was negative and it was felt that he may have extrapyramidal symptoms from the antipsychotics which were stopped. Neurology was consulted and felt that the patient had a right radial neuropathy probably after a fall. An EEG was ordered but was unable to be done due to the holidays. This can be done as an outpatient. There was no evidence of tonic-clonic seizures while here, or at home that were described. Dr. Gerilyn Pilgrim did note a tremor and recommended small doses of Klonopin. Patient had periods of agitation while here but at the time of discharge had been calm and cooperative for several days.  He was noted to have severe oral pharyngeal dysphasia. Speech therapy was consulted and recommended strict n.p.o. After discussion with the wife, she did not want a PEG tube placed and requested the safest texture diet, excepting the risk of aspiration pneumonia. Upon further questioning, his intake has been poor and he is lost a lot of weight. Speech therapy recommended nectar thickened liquids only with crushed medications and ice chips in between meals as desired. Patient's intake while here was rather minimal. I've started him on an appetite stimulant. After further  discussion with the patient's wife, she requested DO NOT RESUSCITATE status. Unless his intake improves, he may be hospice appropriate. The wife voices understanding.  Patient was bradycardic on 12-lead EKG and his metoprolol was decreased. This will need to be rechecked as an outpatient.  Physical therapy recommended skilled nursing facility placement but the wife refused. We have therefore sent him home with maximal assistance in the home. Total time on the day of discharge is greater than 30 minutes.  Discharge Exam:  Blood pressure 148/81, pulse 97, temperature 97.5 F (36.4 C), temperature source Axillary, resp. rate 20, height 5\' 2"  (1.575 m), weight 49.1 kg (108 lb 3.9 oz), SpO2 98.00%. Unchanged from 01/04/2011   Signed: Crista Curb  L 01/05/2011, 10:05 AM

## 2011-01-05 NOTE — Progress Notes (Signed)
Pt d/c today by MD.  Pt's wife has decided to take pt home with home health.  CM aware.  CSW to notify SNF of decision and will sign off.  Karn Cassis

## 2011-01-05 NOTE — Progress Notes (Signed)
Discharge summary sent to payer through [employer]  

## 2011-09-15 ENCOUNTER — Emergency Department (HOSPITAL_COMMUNITY): Payer: Medicare Other

## 2011-09-15 ENCOUNTER — Inpatient Hospital Stay (HOSPITAL_COMMUNITY)
Admission: EM | Admit: 2011-09-15 | Discharge: 2011-09-17 | DRG: 071 | Disposition: A | Discharge status: Home / Self Care | Payer: Medicare Other | Attending: Internal Medicine | Admitting: Internal Medicine

## 2011-09-15 ENCOUNTER — Encounter (HOSPITAL_COMMUNITY): Payer: Self-pay | Admitting: Emergency Medicine

## 2011-09-15 DIAGNOSIS — I2510 Atherosclerotic heart disease of native coronary artery without angina pectoris: Secondary | ICD-10-CM

## 2011-09-15 DIAGNOSIS — I5032 Chronic diastolic (congestive) heart failure: Secondary | ICD-10-CM

## 2011-09-15 DIAGNOSIS — F068 Other specified mental disorders due to known physiological condition: Secondary | ICD-10-CM | POA: Diagnosis present

## 2011-09-15 DIAGNOSIS — N179 Acute kidney failure, unspecified: Secondary | ICD-10-CM

## 2011-09-15 DIAGNOSIS — I69928 Other speech and language deficits following unspecified cerebrovascular disease: Secondary | ICD-10-CM

## 2011-09-15 DIAGNOSIS — R4182 Altered mental status, unspecified: Secondary | ICD-10-CM

## 2011-09-15 DIAGNOSIS — I10 Essential (primary) hypertension: Secondary | ICD-10-CM

## 2011-09-15 DIAGNOSIS — Z79899 Other long term (current) drug therapy: Secondary | ICD-10-CM

## 2011-09-15 DIAGNOSIS — R6251 Failure to thrive (child): Secondary | ICD-10-CM | POA: Diagnosis present

## 2011-09-15 DIAGNOSIS — I69328 Other speech and language deficits following cerebral infarction: Secondary | ICD-10-CM

## 2011-09-15 DIAGNOSIS — N289 Disorder of kidney and ureter, unspecified: Secondary | ICD-10-CM | POA: Diagnosis present

## 2011-09-15 DIAGNOSIS — R1310 Dysphagia, unspecified: Secondary | ICD-10-CM

## 2011-09-15 DIAGNOSIS — G9340 Encephalopathy, unspecified: Principal | ICD-10-CM

## 2011-09-15 DIAGNOSIS — I509 Heart failure, unspecified: Secondary | ICD-10-CM | POA: Diagnosis present

## 2011-09-15 DIAGNOSIS — Z9181 History of falling: Secondary | ICD-10-CM

## 2011-09-15 DIAGNOSIS — R627 Adult failure to thrive: Secondary | ICD-10-CM | POA: Diagnosis present

## 2011-09-15 DIAGNOSIS — D649 Anemia, unspecified: Secondary | ICD-10-CM | POA: Diagnosis present

## 2011-09-15 DIAGNOSIS — R55 Syncope and collapse: Secondary | ICD-10-CM

## 2011-09-15 DIAGNOSIS — F05 Delirium due to known physiological condition: Secondary | ICD-10-CM | POA: Diagnosis present

## 2011-09-15 DIAGNOSIS — Z7982 Long term (current) use of aspirin: Secondary | ICD-10-CM

## 2011-09-15 DIAGNOSIS — E860 Dehydration: Secondary | ICD-10-CM | POA: Diagnosis present

## 2011-09-15 DIAGNOSIS — F0390 Unspecified dementia without behavioral disturbance: Secondary | ICD-10-CM

## 2011-09-15 DIAGNOSIS — I69959 Hemiplegia and hemiparesis following unspecified cerebrovascular disease affecting unspecified side: Secondary | ICD-10-CM

## 2011-09-15 HISTORY — DX: Encephalopathy, unspecified: G93.40

## 2011-09-15 LAB — URINALYSIS, ROUTINE W REFLEX MICROSCOPIC
Ketones, ur: NEGATIVE mg/dL
Leukocytes, UA: NEGATIVE
Nitrite: NEGATIVE
Specific Gravity, Urine: 1.022 (ref 1.005–1.030)
Urobilinogen, UA: 1 mg/dL (ref 0.0–1.0)
pH: 5.5 (ref 5.0–8.0)

## 2011-09-15 LAB — COMPREHENSIVE METABOLIC PANEL
ALT: 130 U/L — ABNORMAL HIGH (ref 0–53)
AST: 43 U/L — ABNORMAL HIGH (ref 0–37)
Albumin: 3.2 g/dL — ABNORMAL LOW (ref 3.5–5.2)
CO2: 20 mEq/L (ref 19–32)
Calcium: 9 mg/dL (ref 8.4–10.5)
Creatinine, Ser: 1.46 mg/dL — ABNORMAL HIGH (ref 0.50–1.35)
GFR calc non Af Amer: 44 mL/min — ABNORMAL LOW (ref >90)
Sodium: 138 mEq/L (ref 135–145)
Total Protein: 6.5 g/dL (ref 6.0–8.3)

## 2011-09-15 LAB — POCT I-STAT, CHEM 8
BUN: 25 mg/dL — ABNORMAL HIGH (ref 6–23)
Calcium, Ion: 1.27 mmol/L (ref 1.13–1.30)
Chloride: 111 mEq/L (ref 96–112)
Glucose, Bld: 96 mg/dL (ref 70–99)
TCO2: 19 mmol/L (ref 0–100)

## 2011-09-15 LAB — CBC WITH DIFFERENTIAL/PLATELET
Basophils Absolute: 0 10*3/uL (ref 0.0–0.1)
Basophils Relative: 0 % (ref 0–1)
Eosinophils Absolute: 0.1 10*3/uL (ref 0.0–0.7)
Eosinophils Relative: 2 % (ref 0–5)
HCT: 31.8 % — ABNORMAL LOW (ref 39.0–52.0)
Lymphocytes Relative: 31 % (ref 12–46)
MCH: 31.8 pg (ref 26.0–34.0)
MCHC: 33.6 g/dL (ref 30.0–36.0)
MCV: 94.4 fL (ref 78.0–100.0)
Monocytes Absolute: 1.1 10*3/uL — ABNORMAL HIGH (ref 0.1–1.0)
Platelets: 120 10*3/uL — ABNORMAL LOW (ref 150–400)
RDW: 12.4 % (ref 11.5–15.5)
WBC: 6.7 10*3/uL (ref 4.0–10.5)

## 2011-09-15 LAB — TROPONIN I
Troponin I: <0.3 ng/mL (ref <0.30)
Troponin I: <0.3 ng/mL (ref <0.30)

## 2011-09-15 LAB — RAPID URINE DRUG SCREEN, HOSP PERFORMED
Barbiturates: NOT DETECTED
Barbiturates: NOT DETECTED
Cocaine: NOT DETECTED
Cocaine: NOT DETECTED

## 2011-09-15 LAB — GLUCOSE, CAPILLARY: Glucose-Capillary: 91 mg/dL (ref 70–99)

## 2011-09-15 LAB — PROTIME-INR: INR: 1.11 (ref 0.00–1.49)

## 2011-09-15 MED ORDER — SODIUM CHLORIDE 0.9 % IV SOLN
INTRAVENOUS | Status: DC
  Administered 2011-09-15: 23:00:00 via INTRAVENOUS

## 2011-09-15 MED ORDER — SODIUM CHLORIDE 0.9 % IV SOLN: INTRAVENOUS | Status: DC

## 2011-09-15 MED ORDER — HYDRALAZINE HCL 20 MG/ML IJ SOLN
10.0000 mg | INTRAMUSCULAR | Status: DC | PRN
  Administered 2011-09-16: 10 mg via INTRAVENOUS
  Filled 2011-09-15: qty 1

## 2011-09-15 MED ORDER — PANTOPRAZOLE SODIUM 40 MG IV SOLR
40.0000 mg | Freq: Every day | INTRAVENOUS | Status: DC
  Administered 2011-09-15 – 2011-09-16 (×2): 40 mg via INTRAVENOUS
  Filled 2011-09-15 (×3): qty 40

## 2011-09-15 MED ORDER — METOPROLOL TARTRATE 50 MG PO TABS: 50.0000 mg | ORAL_TABLET | Freq: Every day | ORAL | Status: DC

## 2011-09-15 MED ORDER — ASPIRIN 325 MG PO TABS
325.0000 mg | ORAL_TABLET | Freq: Every day | ORAL | Status: DC
  Filled 2011-09-15: qty 1

## 2011-09-15 MED ORDER — ASPIRIN EC 81 MG PO TBEC: 162.0000 mg | DELAYED_RELEASE_TABLET | Freq: Every day | ORAL | Status: DC

## 2011-09-15 MED ORDER — PANTOPRAZOLE SODIUM 40 MG PO TBEC: 40.0000 mg | DELAYED_RELEASE_TABLET | Freq: Every day | ORAL | Status: DC

## 2011-09-15 MED ORDER — METOPROLOL TARTRATE 1 MG/ML IV SOLN
2.5000 mg | Freq: Four times a day (QID) | INTRAVENOUS | Status: DC
  Administered 2011-09-15 – 2011-09-17 (×6): 2.5 mg via INTRAVENOUS
  Filled 2011-09-15 (×14): qty 5

## 2011-09-15 MED ORDER — RAMIPRIL 10 MG PO CAPS
10.0000 mg | ORAL_CAPSULE | Freq: Every day | ORAL | Status: DC
  Filled 2011-09-15 (×2): qty 1

## 2011-09-15 MED ORDER — ASPIRIN 81 MG PO TABS: 162.0000 mg | ORAL_TABLET | Freq: Every day | ORAL | Status: DC

## 2011-09-15 MED ORDER — CLONAZEPAM 0.5 MG PO TABS: 0.5000 mg | ORAL_TABLET | Freq: Two times a day (BID) | ORAL | Status: DC | PRN

## 2011-09-15 MED ORDER — TAMSULOSIN HCL 0.4 MG PO CAPS
0.4000 mg | ORAL_CAPSULE | Freq: Every day | ORAL | Status: DC
  Filled 2011-09-15 (×2): qty 1

## 2011-09-15 MED ORDER — LORAZEPAM 2 MG/ML IJ SOLN
0.5000 mg | Freq: Four times a day (QID) | INTRAMUSCULAR | Status: DC | PRN
  Administered 2011-09-15: 0.5 mg via INTRAVENOUS
  Filled 2011-09-15 (×2): qty 1

## 2011-09-15 MED ORDER — DOCUSATE SODIUM 100 MG PO CAPS
100.0000 mg | ORAL_CAPSULE | Freq: Three times a day (TID) | ORAL | Status: DC
  Filled 2011-09-15 (×7): qty 1

## 2011-09-15 MED ORDER — ASPIRIN 300 MG RE SUPP
300.0000 mg | Freq: Every day | RECTAL | Status: DC
  Administered 2011-09-16: 300 mg via RECTAL
  Filled 2011-09-15 (×3): qty 1

## 2011-09-15 MED ORDER — ALPRAZOLAM 0.5 MG PO TABS: 1.0000 mg | ORAL_TABLET | Freq: Every evening | ORAL | Status: DC | PRN

## 2011-09-15 MED ORDER — RAMIPRIL 10 MG PO TABS: 10.0000 mg | ORAL_TABLET | Freq: Every day | ORAL | Status: DC

## 2011-09-15 MED ORDER — SIMVASTATIN 40 MG PO TABS
40.0000 mg | ORAL_TABLET | Freq: Every day | ORAL | Status: DC
  Filled 2011-09-15 (×3): qty 1

## 2011-09-15 NOTE — ED Notes (Signed)
Per Amada Jupiter MD Code Stroke cancelled [phone].

## 2011-09-15 NOTE — Plan of Care (Signed)
MRN: [id number]  Patient's Name: [name]  PCP: Kirk Ruths, MD  Consulting Neurologist in ED: Dr. Amada Jupiter  ED physician: Dr. Ranae Palms.  78 year old male with history of dementia, per ED physician has baseline confusion presented with altered mental status and syncope.  Initially code stroke was called which was canceled.  Patient to be admitted to the hospitalist service for syncope workup.  May need EEG and MRI of the brain for further evaluation.  Per ED neurology will follow the patient.  BP 95/57  Pulse 71  Temp 98 F (36.7 C) (Oral)  Resp 16  SpO2 100%  Abisai Coble A, MD 09/15/2011, 7:07 PM

## 2011-09-15 NOTE — Code Documentation (Signed)
78 yo male who lives at home with his family.  At 1300 today he did not feel well and family thought he wasn't quite himself.  He was sitting in his chair when at 1430 he had a syncopal event, losing consciousness for a few minutes.  When he aroused, the family noted diaphoresis, L facial weakness and difficulty speaking.  EMS was called and they activated code stroke at 1457. He arrived in [facility] ED at 1514. Stroke team had arrived at 86 and EDP and neurologist did quick assessment on arrival, clearing for CT. Arrived in CT at 1517, and CT was read as no acute abnormality by Dr. Amada Jupiter at 1520.  Pt was taken to room 14; NIHSS=3 (see document and notations).  From EMS report, he seems to be rapidly improving and per Dr. Amada Jupiter, code stroke was canceled at 1538 since no acute intervention is indicated.  Pt is awake, slow to respond to questions.  Sinus bradycardia with rate ~58.  Speech is clear with some word finding.  Family here and updated. Agreeable with plan to admit for w/u.

## 2011-09-15 NOTE — ED Provider Notes (Signed)
History     CSN: [id number]  Arrival date & time 09/15/11  1514   First MD Initiated Contact with Patient 09/15/11 1518      Chief Complaint  Patient presents with  . Code Stroke    (Consider location/radiation/quality/duration/timing/severity/associated sxs/prior treatment) HPI Pt present via EMS as code stroke. Neurologist at pt bedside upon arrival. No family present and pt has chronic dementia and is a difficult historian. Per EMS pt had AMS starting around 1 with increased confusion and questionable L facial droop and L ext weakness. Pt had syncopal episode 1 hour before presentation and prompted family to call EMS.  Past Medical History  Diagnosis Date  . Stroke   . Hypertension   . Coronary artery disease 09/2004    With stenting  . Frequent falls 12/29/2010  . Dementia   . Anemia 12/29/2010  . Thrombocytopenia 12/29/2010  . Senile dementia with delirium 12/29/2010  . 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 Cerebellar artery occlusion or stenosis 12/29/2010  . Basilar artery stenosis 12/29/2010  . Thyroglossal duct cyst   . Dysphagia 12/31/2010  . Tardive dyskinesia 12/31/2010  . Radial neuropathy 01/01/2011    Past Surgical History  Procedure Date  . Cardiac surgery     No family history on file.  History  Substance Use Topics  . Smoking status: Never Smoker   . Smokeless tobacco: Never Used  . Alcohol Use: No      Review of Systems  HENT: Negative for neck pain.   Respiratory: Negative for cough and shortness of breath.   Cardiovascular: Negative for chest pain.  Gastrointestinal: Negative for nausea, vomiting and abdominal pain.  Neurological: Positive for syncope. Negative for weakness, numbness and headaches.    Allergies  Review of patient's allergies indicates no known allergies.  Home Medications   Current Outpatient Rx  Name Route Sig Dispense Refill  . ALPRAZOLAM 1 MG PO TABS Oral Take 1 mg  by mouth at bedtime as needed. For anxiety    . ASPIRIN 81 MG PO TABS Oral Take 162 mg by mouth daily.     Marland Kitchen CLONAZEPAM 0.5 MG PO TABS Oral Take 0.5 mg by mouth 2 (two) times daily as needed.    Marland Kitchen DOCUSATE SODIUM 100 MG PO CAPS Oral Take 100 mg by mouth 3 (three) times daily.      Marland Kitchen METOPROLOL TARTRATE 50 MG PO TABS Oral Take 50 mg by mouth daily.    Marland Kitchen OMEPRAZOLE 20 MG PO CPDR Oral Take 20 mg by mouth daily.      Marland Kitchen RAMIPRIL 10 MG PO TABS Oral Take 10 mg by mouth daily.      Marland Kitchen SIMVASTATIN 40 MG PO TABS Oral Take 40 mg by mouth at bedtime.      . TAMSULOSIN HCL 0.4 MG PO CAPS Oral Take 0.4 mg by mouth daily.    Marland Kitchen CLONAZEPAM 0.5 MG PO TABS Oral Take 0.5 tablets (0.25 mg total) by mouth 2 (two) times daily. 30 tablet 0    BP 95/57  Pulse 71  Temp 98 F (36.7 C) (Oral)  Resp 16  SpO2 100%  Physical Exam  Nursing note and vitals reviewed. Constitutional: He appears well-developed and well-nourished. No distress.       Protecting airway, no acute distress  HENT:  Head: Normocephalic and atraumatic.  Mouth/Throat: Oropharynx is clear and moist.  Eyes: EOM are normal. Pupils are equal, round, and reactive to light.  bl pinpoint pupils  Neck: Normal range of motion. Neck supple.       Soft tissue mass ant neck appears chronic. Nonpulsitile. No posterior cervical tenderness or rigidity  Cardiovascular: Normal rate and regular rhythm.   Pulmonary/Chest: Effort normal and breath sounds normal. No respiratory distress. He has no wheezes. He has no rales.  Abdominal: Soft. Bowel sounds are normal. There is no tenderness. There is no rebound and no guarding.  Musculoskeletal: Normal range of motion. He exhibits no edema and no tenderness.  Neurological: He is alert.       Oriented to person, finger to nose intact, no facial droop, 5/5 motor in all ext, sensation grossly intact  Skin: Skin is warm and dry. No rash noted. No erythema.  Psychiatric: He has a normal mood and affect. His  behavior is normal.    ED Course  Procedures (including critical care time)  Labs Reviewed  POCT I-STAT, CHEM 8 - Abnormal; Notable for the following:    BUN 25 (*)     Creatinine, Ser 1.50 (*)     Hemoglobin 11.9 (*)     HCT 35.0 (*)     All other components within normal limits  CBC WITH DIFFERENTIAL - Abnormal; Notable for the following:    RBC 3.37 (*)     Hemoglobin 10.7 (*)     HCT 31.8 (*)     Platelets 120 (*)     Monocytes Relative 17 (*)     Monocytes Absolute 1.1 (*)     All other components within normal limits  COMPREHENSIVE METABOLIC PANEL - Abnormal; Notable for the following:    BUN 24 (*)     Creatinine, Ser 1.46 (*)     Albumin 3.2 (*)     AST 43 (*)     ALT 130 (*)     GFR calc non Af Amer 44 (*)     GFR calc Af Amer 51 (*)     All other components within normal limits  URINALYSIS, ROUTINE W REFLEX MICROSCOPIC - Abnormal; Notable for the following:    Bilirubin Urine SMALL (*)     All other components within normal limits  URINE RAPID DRUG SCREEN (HOSP PERFORMED) - Abnormal; Notable for the following:    Benzodiazepines POSITIVE (*)     All other components within normal limits  PROTIME-INR  TROPONIN I  GLUCOSE, CAPILLARY   Ct Head Wo Contrast  09/15/2011  *RADIOLOGY REPORT*  Clinical Data: Left facial droop and slurred speech.  CT HEAD WITHOUT CONTRAST  Technique:  Contiguous axial images were obtained from the base of the skull through the vertex without contrast.  Comparison: 03/13/2010 CT.  12/29/2010 MR.  Findings: No intracranial hemorrhage.  Prominent small vessel disease type changes without CT evidence of large acute infarct.  Global atrophy.  Ventricular prominence felt to be related to atrophy rather than hydrocephalus.  No intracranial mass lesion detected on this unenhanced exam.  Vascular calcifications.  Right maxillary sinus mucosal thickening.  IMPRESSION: No intracranial hemorrhage or CT evidence of large acute infarct.  Prominent small  vessel disease type changes.  This limits detection of small acute infarct.  Global atrophy.  Right maxillary sinus mucosal thickening.  Code stroke and discussed with Dr. Amada Jupiter 09/15/2011 3:34 pm.   Original Report Authenticated By: Fuller Canada, M.D.    Dg Chest Port 1 View  09/15/2011  *RADIOLOGY REPORT*  Clinical Data: Syncope.  Altered level of consciousness.  PORTABLE CHEST -  1 VIEW  Comparison: 12/30/2010  Findings: The heart is mildly enlarged but also accentuated by the technique.  Patient has coronary stent.  There are no focal consolidations or pleural effusions.  No pulmonary edema. Prominent superior mediastinal density may be related to is consistent with previous imaging showing a cystic mass anterior to the lower thyroid.  IMPRESSION:  1.  Cardiomegaly. 2. No evidence for acute cardiopulmonary  abnormality. 3.  Superior mediastinal mass, previously imaged.   Original Report Authenticated By: Patterson Hammersmith, M.D.      1. Altered mental status   2. Syncope      Date: 09/15/2011  Rate: 79  Rhythm: normal sinus rhythm  QRS Axis: normal  Intervals: normal  ST/T Wave abnormalities: normal  Conduction Disutrbances:none  Narrative Interpretation:   Old EKG Reviewed: none available    MDM  Not TPA candidate due to resolving symptoms and unclear cause  Discussed with Dr Betti Cruz who will admit for [facility]  Loren Racer, MD 09/15/11 1850

## 2011-09-15 NOTE — H&P (Signed)
[name] is an 78 y.o. male.   Patient was seen and examined on September 15, 2011 at 8:15 PM. PCP - Dr. Marcha Dutton. Chief Complaint: Altered mental status. HPI: 78 year old male with history of CVA with right upper extremity weakness, dementia, hypertension, hyperlipidemia, CAD status post stenting was brought to the ER after patient's wife found patient currently unresponsive at her home. She had just gone out of her house to pick the male and within a few minutes patient's mental status change. EMS was called and the patient was brought to the ER as a code stroke. Patient became more alert by the time patient reached ER. CT head was negative. There is a mention of left facial droop as witnessed by the EMS which also was not obvious when patient is the ER. Code stroke was canceled by the neurologist as patient's symptoms have resolved. Patient will be admitted for further monitoring and management. There was no seizure-like activity tongue bite or incontinence of urine.  Past Medical History  Diagnosis Date  . Stroke   . Hypertension   . Coronary artery disease 09/2004    With stenting  . Frequent falls 12/29/2010  . Dementia   . Anemia 12/29/2010  . Thrombocytopenia 12/29/2010  . Senile dementia with delirium 12/29/2010  . 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 Cerebellar artery occlusion or stenosis 12/29/2010  . Basilar artery stenosis 12/29/2010  . Thyroglossal duct cyst   . Dysphagia 12/31/2010  . Tardive dyskinesia 12/31/2010  . Radial neuropathy 01/01/2011    Past Surgical History  Procedure Date  . Cardiac surgery     History reviewed. No pertinent family history. Social History:  reports that he has never smoked. He has never used smokeless tobacco. He reports that he does not drink alcohol or use illicit drugs.  Allergies: No Known Allergies  Medications Prior to Admission  Medication Sig Dispense Refill  .  ALPRAZolam (XANAX) 1 MG tablet Take 1 mg by mouth at bedtime as needed. For anxiety      . aspirin 81 MG tablet Take 162 mg by mouth daily.       . clonazePAM (KLONOPIN) 0.5 MG tablet Take 0.5 mg by mouth 2 (two) times daily as needed.      . docusate sodium (COLACE) 100 MG capsule Take 100 mg by mouth 3 (three) times daily.        . metoprolol (LOPRESSOR) 50 MG tablet Take 50 mg by mouth daily.      Marland Kitchen omeprazole (PRILOSEC) 20 MG capsule Take 20 mg by mouth daily.        . ramipril (ALTACE) 10 MG tablet Take 10 mg by mouth daily.        . simvastatin (ZOCOR) 40 MG tablet Take 40 mg by mouth at bedtime.        . Tamsulosin HCl (FLOMAX) 0.4 MG CAPS Take 0.4 mg by mouth daily.      . clonazePAM (KLONOPIN) 0.5 MG tablet Take 0.5 tablets (0.25 mg total) by mouth 2 (two) times daily.  30 tablet  0    Results for orders placed during the hospital encounter of 09/15/11 (from the past 48 hour(s))  POCT I-STAT, CHEM 8     Status: Abnormal   Collection Time   09/15/11  3:22 PM      Component Value Range Comment   Sodium 144  135 - 145 mEq/L    Potassium 3.6  3.5 - 5.1  mEq/L    Chloride 111  96 - 112 mEq/L    BUN 25 (*) 6 - 23 mg/dL    Creatinine, Ser 0.98 (*) 0.50 - 1.35 mg/dL    Glucose, Bld 96  70 - 99 mg/dL    Calcium, Ion 1.19  1.47 - 1.30 mmol/L    TCO2 19  0 - 100 mmol/L    Hemoglobin 11.9 (*) 13.0 - 17.0 g/dL    HCT 82.9 (*) 56.2 - 52.0 %   CBC WITH DIFFERENTIAL     Status: Abnormal   Collection Time   09/15/11  3:35 PM      Component Value Range Comment   WBC 6.7  4.0 - 10.5 K/uL    RBC 3.37 (*) 4.22 - 5.81 MIL/uL    Hemoglobin 10.7 (*) 13.0 - 17.0 g/dL    HCT 13.0 (*) 86.5 - 52.0 %    MCV 94.4  78.0 - 100.0 fL    MCH 31.8  26.0 - 34.0 pg    MCHC 33.6  30.0 - 36.0 g/dL    RDW 78.4  69.6 - 29.5 %    Platelets 120 (*) 150 - 400 K/uL    Neutrophils Relative 50  43 - 77 %    Neutro Abs 3.3  1.7 - 7.7 K/uL    Lymphocytes Relative 31  12 - 46 %    Lymphs Abs 2.1  0.7 - 4.0 K/uL     Monocytes Relative 17 (*) 3 - 12 %    Monocytes Absolute 1.1 (*) 0.1 - 1.0 K/uL    Eosinophils Relative 2  0 - 5 %    Eosinophils Absolute 0.1  0.0 - 0.7 K/uL    Basophils Relative 0  0 - 1 %    Basophils Absolute 0.0  0.0 - 0.1 K/uL   COMPREHENSIVE METABOLIC PANEL     Status: Abnormal   Collection Time   09/15/11  3:35 PM      Component Value Range Comment   Sodium 138  135 - 145 mEq/L    Potassium 3.5  3.5 - 5.1 mEq/L    Chloride 109  96 - 112 mEq/L    CO2 20  19 - 32 mEq/L    Glucose, Bld 94  70 - 99 mg/dL    BUN 24 (*) 6 - 23 mg/dL    Creatinine, Ser 2.84 (*) 0.50 - 1.35 mg/dL    Calcium 9.0  8.4 - 13.2 mg/dL    Total Protein 6.5  6.0 - 8.3 g/dL    Albumin 3.2 (*) 3.5 - 5.2 g/dL    AST 43 (*) 0 - 37 U/L    ALT 130 (*) 0 - 53 U/L    Alkaline Phosphatase 87  39 - 117 U/L    Total Bilirubin 0.7  0.3 - 1.2 mg/dL    GFR calc non Af Amer 44 (*) >90 mL/min    GFR calc Af Amer 51 (*) >90 mL/min   PROTIME-INR     Status: Normal   Collection Time   09/15/11  3:35 PM      Component Value Range Comment   Prothrombin Time 14.5  11.6 - 15.2 seconds    INR 1.11  0.00 - 1.49   TROPONIN I     Status: Normal   Collection Time   09/15/11  3:35 PM      Component Value Range Comment   Troponin I <0.30  <0.30 ng/mL   GLUCOSE, CAPILLARY  Status: Normal   Collection Time   09/15/11  5:10 PM      Component Value Range Comment   Glucose-Capillary 91  70 - 99 mg/dL   URINALYSIS, ROUTINE W REFLEX MICROSCOPIC     Status: Abnormal   Collection Time   09/15/11  5:20 PM      Component Value Range Comment   Color, Urine YELLOW  YELLOW    APPearance CLEAR  CLEAR    Specific Gravity, Urine 1.022  1.005 - 1.030    pH 5.5  5.0 - 8.0    Glucose, UA NEGATIVE  NEGATIVE mg/dL    Hgb urine dipstick NEGATIVE  NEGATIVE    Bilirubin Urine SMALL (*) NEGATIVE    Ketones, ur NEGATIVE  NEGATIVE mg/dL    Protein, ur NEGATIVE  NEGATIVE mg/dL    Urobilinogen, UA 1.0  0.0 - 1.0 mg/dL    Nitrite NEGATIVE  NEGATIVE      Leukocytes, UA NEGATIVE  NEGATIVE MICROSCOPIC NOT DONE ON URINES WITH NEGATIVE PROTEIN, BLOOD, LEUKOCYTES, NITRITE, OR GLUCOSE <1000 mg/dL.  URINE RAPID DRUG SCREEN (HOSP PERFORMED)     Status: Abnormal   Collection Time   09/15/11  5:20 PM      Component Value Range Comment   Opiates NONE DETECTED  NONE DETECTED    Cocaine NONE DETECTED  NONE DETECTED    Benzodiazepines POSITIVE (*) NONE DETECTED    Amphetamines NONE DETECTED  NONE DETECTED    Tetrahydrocannabinol NONE DETECTED  NONE DETECTED    Barbiturates NONE DETECTED  NONE DETECTED    Ct Head Wo Contrast  09/15/2011  *RADIOLOGY REPORT*  Clinical Data: Left facial droop and slurred speech.  CT HEAD WITHOUT CONTRAST  Technique:  Contiguous axial images were obtained from the base of the skull through the vertex without contrast.  Comparison: 03/13/2010 CT.  12/29/2010 MR.  Findings: No intracranial hemorrhage.  Prominent small vessel disease type changes without CT evidence of large acute infarct.  Global atrophy.  Ventricular prominence felt to be related to atrophy rather than hydrocephalus.  No intracranial mass lesion detected on this unenhanced exam.  Vascular calcifications.  Right maxillary sinus mucosal thickening.  IMPRESSION: No intracranial hemorrhage or CT evidence of large acute infarct.  Prominent small vessel disease type changes.  This limits detection of small acute infarct.  Global atrophy.  Right maxillary sinus mucosal thickening.  Code stroke and discussed with Dr. Amada Jupiter 09/15/2011 3:34 pm.   Original Report Authenticated By: Fuller Canada, M.D.    Dg Chest Port 1 View  09/15/2011  *RADIOLOGY REPORT*  Clinical Data: Syncope.  Altered level of consciousness.  PORTABLE CHEST - 1 VIEW  Comparison: 12/30/2010  Findings: The heart is mildly enlarged but also accentuated by the technique.  Patient has coronary stent.  There are no focal consolidations or pleural effusions.  No pulmonary edema. Prominent superior mediastinal  density may be related to is consistent with previous imaging showing a cystic mass anterior to the lower thyroid.  IMPRESSION:  1.  Cardiomegaly. 2. No evidence for acute cardiopulmonary  abnormality. 3.  Superior mediastinal mass, previously imaged.   Original Report Authenticated By: Patterson Hammersmith, M.D.     Review of Systems  Constitutional: Negative.   HENT: Negative.   Eyes: Negative.   Respiratory: Negative.   Cardiovascular: Negative.   Gastrointestinal: Negative.   Genitourinary: Negative.   Musculoskeletal: Negative.   Skin: Negative.   Neurological:       Change in mental status.  Endo/Heme/Allergies: Negative.   Psychiatric/Behavioral: Negative.     Blood pressure 190/80, pulse 62, temperature 98.1 F (36.7 C), temperature source Oral, resp. rate 17, height 5\' 3"  (1.6 m), weight 57.063 kg (125 lb 12.8 oz), SpO2 100.00%. Physical Exam  Constitutional: He appears well-developed.       Moderately nourished.  HENT:  Head: Normocephalic and atraumatic.  Right Ear: External ear normal.  Left Ear: External ear normal.  Nose: Nose normal.  Mouth/Throat: Oropharynx is clear and moist. No oropharyngeal exudate.  Eyes: Conjunctivae are normal. Pupils are equal, round, and reactive to light. Right eye exhibits no discharge. Left eye exhibits no discharge. No scleral icterus.  Neck: Normal range of motion. Neck supple.  Cardiovascular: Normal rate and regular rhythm.   Respiratory: Effort normal and breath sounds normal. No respiratory distress. He has no wheezes. He has no rales.  GI: Soft. Bowel sounds are normal. He exhibits no distension. There is no tenderness.  Musculoskeletal: He exhibits no edema.  Neurological:       Oriented to his name. Follows commands. Right upper extremity 3/5. Rest 5/5. No facial asymmetry. Tongue is midline.  Skin: Skin is warm and dry.     Assessment/Plan #1. Transient change in mental status - at this time concerning for any  seizure-like activity of CVA. Patient will be placed on neurochecks and swallow evaluation and MRI brain and EEG has been ordered. #2. Mild renal insufficiency - this may be from dehydration. Gently hydrate follow metabolic panel and intake output. #3. Hypertension - continue present medications. #4. Hyperlipidemia - continue present medications. #5. History of CAD - denies any chest pain. #6. Dementia.  CODE STATUS - discussed with patient's wife. Patient is a full code.  Eduard Clos 09/15/2011, 8:46 PM

## 2011-09-15 NOTE — ED Notes (Signed)
Checked patient blood sugar it was 91 notified RN Kayla of blood sugar

## 2011-09-15 NOTE — Consult Note (Signed)
Reason for Consult: Concern for stroke Referring Physician: Loren Racer  CC: Altered mental status  History is obtained from: Patient, wife  HPI: [name] is an 78 y.o. male with a history of stroke causing right hemiparesis as well as dementia who presents with an episode of transient alteration of consciousness. His wife says that she stepped out for a few minutes and when she returned, she found him slumped over and unresponsive. When EMS arrived, they found him to be confused and felt like he had a left facial droop. He was therefore brought in to the ER of a code stroke. He continued to improve rapidly and by the time of arrival to the ER, he was conversant, and no facial droop was noted on my exam.  No clear seizure activity was seen.  ROS: An 11 point ROS was performed and is negative except as noted in the HPI.  Past Medical History  Diagnosis Date  . Stroke   . Hypertension   . Coronary artery disease 09/2004    With stenting  . Frequent falls 12/29/2010  . Dementia   . Anemia 12/29/2010  . Thrombocytopenia 12/29/2010  . Senile dementia with delirium 12/29/2010  . 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 Cerebellar artery occlusion or stenosis 12/29/2010  . Basilar artery stenosis 12/29/2010  . Thyroglossal duct cyst   . Dysphagia 12/31/2010  . Tardive dyskinesia 12/31/2010  . Radial neuropathy 01/01/2011    Exam: Current vital signs: BP 95/57  Pulse 71  Temp 98 F (36.7 C) (Oral)  Resp 16  SpO2 100% Vital signs in last 24 hours: Temp:  [98 F (36.7 C)] 98 F (36.7 C) (09/05 1535) Pulse Rate:  [59-71] 71  (09/05 1714) Resp:  [12-18] 16  (09/05 1714) BP: (95-136)/(57-78) 95/57 mmHg (09/05 1714) SpO2:  [99 %-100 %] 100 % (09/05 1714)  General: Awake, on stretcher CV: Regular rate and rhythm Mental Status: Patient is awake, alert, oriented to person, hospital, but not month or year Patient appears to  have some difficulty with his immediate recall He has some difficulty with word finding. Cranial Nerves: II: Visual Fields are full. Pupils are equal, round, and reactive to light.  Discs are sharp. III,IV, VI: EOMI without ptosis or diploplia.  V,VII: Facial sensation and movement are symmetric.  VIII: hearing is intact to voice X: Uvula elevates symmetrically XI: Shoulder shrug is symmetric. XII: tongue is midline without atrophy or fasciculations.  Motor: Tone is increased in the right leg. Bulk is normal. 5/5 strength was present in all four extremities.  Sensory: Sensation is symmetric to light touch in the arms and legs. Deep Tendon Reflexes: 2+ and symmetric in the biceps and patellae.  Plantars: Toes are downgoing bilaterally.  Cerebellar: FNF intact bilaterally, will not perform HKS on the right Gait: Did not assess due to the acute nature of the evaluation and consideration of TPA.   I have reviewed labs in epic and the results pertinent to this consultation are: CMP, mild elevations of his LFTs and creatinine CBC no leukocytosis UA negative  I have reviewed the images obtained: CT head, no acute findings  Impression: Transient alteration of awareness in a patient with previous stroke and dementia. Differential would include syncope versus seizure. He did not have any focal findings to suggest TIA.  Recommendations: 1) MRI brain 2) EEG in the morning 3) will continue to follow with you   Ritta Slot, MD [facility] [phone]

## 2011-09-16 ENCOUNTER — Encounter (HOSPITAL_COMMUNITY): Payer: Self-pay | Admitting: General Practice

## 2011-09-16 ENCOUNTER — Observation Stay (HOSPITAL_COMMUNITY): Payer: Medicare Other

## 2011-09-16 DIAGNOSIS — G459 Transient cerebral ischemic attack, unspecified: Secondary | ICD-10-CM

## 2011-09-16 DIAGNOSIS — R1310 Dysphagia, unspecified: Secondary | ICD-10-CM

## 2011-09-16 DIAGNOSIS — N179 Acute kidney failure, unspecified: Secondary | ICD-10-CM

## 2011-09-16 DIAGNOSIS — I5032 Chronic diastolic (congestive) heart failure: Secondary | ICD-10-CM

## 2011-09-16 DIAGNOSIS — R4182 Altered mental status, unspecified: Secondary | ICD-10-CM

## 2011-09-16 DIAGNOSIS — I509 Heart failure, unspecified: Secondary | ICD-10-CM

## 2011-09-16 LAB — COMPREHENSIVE METABOLIC PANEL
Albumin: 3.2 g/dL — ABNORMAL LOW (ref 3.5–5.2)
BUN: 21 mg/dL (ref 6–23)
Creatinine, Ser: 1.14 mg/dL (ref 0.50–1.35)
Total Bilirubin: 0.7 mg/dL (ref 0.3–1.2)
Total Protein: 6.5 g/dL (ref 6.0–8.3)

## 2011-09-16 LAB — HEMOGLOBIN A1C: Hgb A1c MFr Bld: 5.1 % (ref <5.7)

## 2011-09-16 LAB — TROPONIN I: Troponin I: <0.3 ng/mL (ref <0.30)

## 2011-09-16 LAB — LIPID PANEL
Cholesterol: 131 mg/dL (ref 0–200)
HDL: 40 mg/dL (ref >39)
Total CHOL/HDL Ratio: 3.3 RATIO
Triglycerides: 61 mg/dL (ref <150)

## 2011-09-16 LAB — GLUCOSE, CAPILLARY: Glucose-Capillary: 65 mg/dL — ABNORMAL LOW (ref 70–99)

## 2011-09-16 LAB — CBC
HCT: 32.8 % — ABNORMAL LOW (ref 39.0–52.0)
MCV: 93.4 fL (ref 78.0–100.0)
Platelets: 99 10*3/uL — ABNORMAL LOW (ref 150–400)
RBC: 3.51 MIL/uL — ABNORMAL LOW (ref 4.22–5.81)
WBC: 6.8 10*3/uL (ref 4.0–10.5)

## 2011-09-16 LAB — PRO B NATRIURETIC PEPTIDE: Pro B Natriuretic peptide (BNP): 481.3 pg/mL — ABNORMAL HIGH (ref 0–450)

## 2011-09-16 MED ORDER — LORAZEPAM 2 MG/ML IJ SOLN
1.0000 mg | Freq: Once | INTRAMUSCULAR | Status: AC
  Administered 2011-09-16: 1 mg via INTRAVENOUS

## 2011-09-16 MED ORDER — LORAZEPAM 2 MG/ML IJ SOLN
INTRAMUSCULAR | Status: AC
  Filled 2011-09-16: qty 1

## 2011-09-16 NOTE — Progress Notes (Signed)
[facility] PRO[facility]ESS [facility]    SUBJECTIVE   Patient is very figgity and focused on taking the protective gloves off his hands. He will follow only brief one step commands before becoming distracted about his protective gloves.   OBJECTIVE   Vital signs in last 24 hours: Temp:  [98 F (36.7 C)-98.1 F (36.7 C)] 98.1 F (36.7 C) (09/05 2041) Pulse Rate:  [57-79] 60  (09/06 0821) Resp:  [12-18] 18  (09/06 0600) BP: (95-204)/(57-102) 204/77 mmHg (09/06 0821) SpO2:  [72 %-100 %] 100 % (09/06 0600) Weight:  [57.063 kg (125 lb 12.8 oz)] 57.063 kg (125 lb 12.8 oz) (09/05 2041)  Intake/Output from previous day:   Intake/Output this shift:   Nutritional status: NPO  Past Medical History  Diagnosis Date  . Stroke   . Hypertension   . Coronary artery disease 09/2004    With stenting  . Frequent falls 12/29/2010  . Dementia   . Anemia 12/29/2010  . Thrombocytopenia 12/29/2010  . Senile dementia with delirium 12/29/2010  . 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 Cerebellar artery occlusion or stenosis 12/29/2010  . Basilar artery stenosis 12/29/2010  . Thyroglossal duct cyst   . Dysphagia 12/31/2010  . Tardive dyskinesia 12/31/2010  . Radial neuropathy 01/01/2011    Neurologic ROS negative  Musculoskeletal ROS negaitive Neurologic Exam:  Mental Status: Alert, not oriented.  Speech fluent without evidence of aphasia. Able to follow 1 step commands but quickly distracted. Cranial Nerves: II-Visual fields grossly intact. III/IV/VI-Extraocular movements intact.  Pupils reactive bilaterally. Ptosis not present. V/VII-Smile symmetric VIII-grossly intact IX/X-normal gag XI-bilateral shoulder shrug XII-midline tongue extension Motor: He is moving all extremities antigravity but will not stay focused long enough to give a full exam.   Sensory: Pinprick intact throughout, bilaterally Deep  Tendon Reflexes:  Right: Upper Extremity   Left: Upper extremity   biceps (C-5 to C-6) 2/4   biceps (C-5 to C-6) 2/4 tricep (C7) 2/4    triceps (C7) 2/4 Brachioradialis (C6) 2/4  Brachioradialis (C6) 2/4  Lower Extremity Lower Extremity  quadriceps (L-2 to L-4) 2/4   quadriceps (L-2 to L-4) 2/4       Plantars:      Right:  downgoing     Left:  Downgoing Cerebellar: Normal finger-to-nose     Lab Results: Lab Results  Component Value Date/Time   CHOL 131 09/16/2011  2:16 AM   Lipid Panel  Basename 09/16/11 0216  CHOL 131  TRIG 61  HDL 40  CHOLHDL 3.3  VLDL 12  LDLCALC 79    Studies/Results: Ct Head Wo Contrast  09/15/2011  *RADIOLOGY REPORT*  Clinical Data: Left facial droop and slurred speech.  CT HEAD WITHOUT CONTRAST  Technique:  Contiguous axial images were obtained from the base of the skull through the vertex without contrast.  Comparison: 03/13/2010 CT.  12/29/2010 MR.  Findings: No intracranial hemorrhage.  Prominent small vessel disease type changes without CT evidence of large acute infarct.  Global atrophy.  Ventricular prominence felt to be related to atrophy rather than hydrocephalus.  No intracranial mass lesion detected on this unenhanced exam.  Vascular calcifications.  Right maxillary sinus mucosal thickening.  IMPRESSION: No intracranial hemorrhage or CT evidence of large acute infarct.  Prominent  small vessel disease type changes.  This limits detection of small acute infarct.  Global atrophy.  Right maxillary sinus mucosal thickening.  Code stroke and discussed with Dr. Amada Jupiter 09/15/2011 3:34 pm.   Original Report Authenticated By: Fuller Canada, M.D.    Dg Chest Port 1 View  09/15/2011  *RADIOLOGY REPORT*  Clinical Data: Syncope.  Altered level of consciousness.  PORTABLE CHEST - 1 VIEW  Comparison: 12/30/2010  Findings: The heart is mildly enlarged but also accentuated by the technique.  Patient has coronary stent.  There are no focal consolidations or  pleural effusions.  No pulmonary edema. Prominent superior mediastinal density may be related to is consistent with previous imaging showing a cystic mass anterior to the lower thyroid.  IMPRESSION:  1.  Cardiomegaly. 2. No evidence for acute cardiopulmonary  abnormality. 3.  Superior mediastinal mass, previously imaged.   Original Report Authenticated By: Patterson Hammersmith, M.D.     Medications:     Scheduled:   . aspirin  300 mg Rectal Daily  . docusate sodium  100 mg Oral TID  . metoprolol  2.5 mg Intravenous Q6H  . pantoprazole (PROTONIX) IV  40 mg Intravenous QHS  . ramipril  10 mg Oral Daily  . simvastatin  40 mg Oral QHS  . Tamsulosin HCl  0.4 mg Oral Daily  . DISCONTD: aspirin EC  162 mg Oral Daily  . DISCONTD: aspirin  162 mg Oral Daily  . DISCONTD: aspirin  325 mg Oral Daily  . DISCONTD: metoprolol  50 mg Oral Daily  . DISCONTD: pantoprazole  40 mg Oral Q1200  . DISCONTD: ramipril  10 mg Oral Daily    Assessment/Plan:    Assessment:Transient alteration of awareness. Now presenting with confusion and perseveration on his protective gloves. Patient has history of stroke and dementia (currently unable to tell me where he is and date but able to follow only simple commands briefly). Differential would include syncope versus seizure. Continues not have any focal findings to suggest TIA.  Recommendations: 1) EEG 2) MRI--may need sedation as he is very mobile at this time.   Felicie Morn PA-C [facility] [phone]  09/16/2011, 9:00 AM

## 2011-09-16 NOTE — Progress Notes (Signed)
[facility] PROGRESS NOTE  [name] WUJ:[id number] DOB: 1929/01/27 DOA: 09/15/2011 PCP: Kirk Ruths, MD  Assessment/Plan: Principal Problem:  *Encephalopathy: Difficult to say if seizure occurred. Stroke ruled out. His issues may have been from some mild dehydration corrected with very gentle IV fluids. Awaiting EEG. Plan to discharge home tomorrow with wife.  Active Problems:  CVA, old, speech/language deficit: No evidence of acute stroke.   CAD (coronary artery disease): Stable.  Anemia: Stable.   Senile dementia with delirium: This should improve when to take patient back to home with familiar setting. Wife agrees.   Dysphagia: Patient had a previous swallow screen during a previous hospitalization which recommended should the diet. Speech to see prior to discharge tomorrow.   HTN (hypertension), malignant   ARF (acute renal failure): Resolved with gentle IV fluids.   Chronic diastolic CHF (congestive heart failure): BNP stable.  Code Status: Full Family Communication: Discussed plan of care with wife multiple times that Disposition Plan: Home tomorrow   Brief narrative: 78 year old African American male with past medical history of dementia, CVA and CHF presented with altered mental status and concerns for possible stroke and/or seizure. Admitted for workup  Consultants:  K[facility]patrick-neurology  Procedures:  2-D echo-noting grade 1 diastolic dysfunction, nothing acute  MRI of the head noting no signs of acute infarct  Antibiotics:  None  HPI/Subjective: Patient more calm now been agitated earlier. Is not verbal for me  Objective: Filed Vitals:   09/16/11 0821 09/16/11 0920 09/16/11 1420 09/16/11 2022  BP: 204/77 145/69 137/49 163/89  Pulse: 60     Temp:      TempSrc:      Resp:      Height:      Weight:      SpO2:        Intake/Output Summary (Last 24 hours) at 09/16/11 2038 Last data filed at 09/16/11 1923  Gross per 24 hour    Intake      0 ml  Output    350 ml  Net   -350 ml   Filed Weights   09/15/11 2041  Weight: 57.063 kg (125 lb 12.8 oz)    Exam:   General:  Nonverbal for me, likely from medication. Currently more drowsy. Distracted  HEENT: Normocephalic, atraumatic  Cardiovascular: Regular rate and rhythm, S1-S2  Respiratory: Clear to auscultation bilaterally  Abdomen: Soft, nondistended,? Nontender, positive bowel sounds  Extremities: No clubbing or edema  Data Reviewed: Basic Metabolic Panel:  Lab 09/16/11 9562 09/15/11 1535 09/15/11 1522  NA 138 138 144  K 3.9 3.5 3.6  CL 108 109 111  CO2 19 20 --  GLUCOSE 78 94 96  BUN 21 24* 25*  CREATININE 1.14 1.46* 1.50*  CALCIUM 9.3 9.0 --  MG -- -- --  PHOS -- -- --   Liver Function Tests:  Lab 09/16/11 0216 09/15/11 1535  AST 42* 43*  ALT 115* 130*  ALKPHOS 84 87  BILITOT 0.7 0.7  PROT 6.5 6.5  ALBUMIN 3.2* 3.2*   CBC:  Lab 09/16/11 0216 09/15/11 1535 09/15/11 1522  WBC 6.8 6.7 --  NEUTROABS -- 3.3 --  HGB 11.1* 10.7* 11.9*  HCT 32.8* 31.8* 35.0*  MCV 93.4 94.4 --  PLT 99* 120* --   Cardiac Enzymes:  Lab 09/16/11 0826 09/16/11 0216 09/15/11 2124 09/15/11 1535  CKTOTAL -- -- 57 --  CKMB -- -- -- --  CKMBINDEX -- -- -- --  TROPONINI <0.30 <0.30 <0.30 <0.30   BNP (  last 3 results)  Basename 09/16/11 0826  PROBNP 481.3*   CBG:  Lab 09/16/11 1701 09/16/11 1120 09/16/11 0732 09/15/11 2103 09/15/11 1710  GLUCAP 75 65* 69* 80 91      Studies: Ct Head Wo Contrast  09/15/2011  IMPRESSION: No intracranial hemorrhage or CT evidence of large acute infarct.  Prominent small vessel disease type changes.  This limits detection of small acute infarct.  Global atrophy.  Right maxillary sinus mucosal thickening.  Code stroke and discussed with Dr. Amada Jupiter 09/15/2011 3:34 pm.   Original Report Authenticated By: Fuller Canada, M.D.    Mri Brain Without Contrast  09/16/2011   IMPRESSION: No acute intracranial abnormality.   Stable MRI appearance of the brain since 2012.   Original Report Authenticated By: Harley Hallmark, M.D.    Dg Chest Port 1 View  09/15/2011   IMPRESSION:  1.  Cardiomegaly. 2. No evidence for acute cardiopulmonary  abnormality. 3.  Superior mediastinal mass, previously imaged.   Original Report Authenticated By: Patterson Hammersmith, M.D.     Scheduled Meds:   . aspirin  300 mg Rectal Daily  . docusate sodium  100 mg Oral TID  . LORazepam      . LORazepam  1 mg Intravenous Once  . LORazepam  1 mg Intravenous Once  . metoprolol  2.5 mg Intravenous Q6H  . pantoprazole (PROTONIX) IV  40 mg Intravenous QHS  . ramipril  10 mg Oral Daily  . simvastatin  40 mg Oral QHS  . Tamsulosin HCl  0.4 mg Oral Daily  . DISCONTD: aspirin EC  162 mg Oral Daily  . DISCONTD: aspirin  162 mg Oral Daily  . DISCONTD: aspirin  325 mg Oral Daily  . DISCONTD: metoprolol  50 mg Oral Daily  . DISCONTD: pantoprazole  40 mg Oral Q1200  . DISCONTD: ramipril  10 mg Oral Daily   Continuous Infusions:   . sodium chloride 10 mL/hr at 09/16/11 1030  . DISCONTD: sodium chloride        Time spent: 45 minutes    Hollice Espy  [facility] Pager [phone]. If 8PM-8AM, please contact night-coverage at [email or web address], password [facility] 09/16/2011, 8:38 PM  LOS: 1 day

## 2011-09-16 NOTE — Progress Notes (Signed)
Routine EEG completed.  

## 2011-09-16 NOTE — Progress Notes (Signed)
Utilization review completed.  

## 2011-09-16 NOTE — Progress Notes (Signed)
CSW received referral for potential disposition needs. CSW awaiting pt/ot evaluation and pt status to determine pt disposition needs and resources for patient. .Clinical social worker continuing to follow pt to assist with pt dc plans and further csw needs.     [name], [name]  [phone] .09/16/2011 1448pm

## 2011-09-16 NOTE — Progress Notes (Signed)
  Echocardiogram 2D Echocardiogram has been performed.  [name] 09/16/2011, 10:02 AM

## 2011-09-16 NOTE — Procedures (Signed)
History: 78 yo M found by wife slumped over and confused thereafter.   Sedation: None  Background: There is a well defined posterior dominant rhythm of 9 Hz that attenuates with eye opening.   Photic stimulation: Physiologic driving is present.   EEG Diagnosis: Normal waking and drowsy EEG.   Clinical Interpretation: This is a normal EEG. There was no seizure or seizure predisposition recorded on this study.   Ritta Slot, MD [facility] [phone]

## 2011-09-17 DIAGNOSIS — I2510 Atherosclerotic heart disease of native coronary artery without angina pectoris: Secondary | ICD-10-CM

## 2011-09-17 DIAGNOSIS — F0390 Unspecified dementia without behavioral disturbance: Secondary | ICD-10-CM

## 2011-09-17 LAB — COMPREHENSIVE METABOLIC PANEL
ALT: 129 U/L — ABNORMAL HIGH (ref 0–53)
CO2: 20 mEq/L (ref 19–32)
Calcium: 9.2 mg/dL (ref 8.4–10.5)
GFR calc Af Amer: 64 mL/min — ABNORMAL LOW (ref >90)
GFR calc non Af Amer: 55 mL/min — ABNORMAL LOW (ref >90)
Glucose, Bld: 65 mg/dL — ABNORMAL LOW (ref 70–99)
Sodium: 138 mEq/L (ref 135–145)

## 2011-09-17 LAB — GLUCOSE, CAPILLARY
Glucose-Capillary: 63 mg/dL — ABNORMAL LOW (ref 70–99)
Glucose-Capillary: 71 mg/dL (ref 70–99)

## 2011-09-17 MED ORDER — DEXTROSE 50 % IV SOLN
INTRAVENOUS | Status: AC
  Administered 2011-09-17: 50 mL
  Filled 2011-09-17: qty 50

## 2011-09-17 MED ORDER — DEXTROSE-NACL 5-0.9 % IV SOLN
INTRAVENOUS | Status: DC
  Administered 2011-09-17: 06:00:00 via INTRAVENOUS

## 2011-09-17 MED ORDER — DEXTROSE 50 % IV SOLN: 25.0000 mL | Freq: Once | INTRAVENOUS | Status: DC | PRN

## 2011-09-17 MED ORDER — METOPROLOL TARTRATE 50 MG PO TABS: 75.0000 mg | ORAL_TABLET | Freq: Every day | ORAL | Status: DC

## 2011-09-17 NOTE — Progress Notes (Signed)
SLP Cancellation Note RN paged treating SLP to complete BSE as patient NPO status this am. Attempt x1 this date but not completed as patient discharged home.  Moreen Fowler MS, CCC-SLP [phone] [facility] 09/17/2011, 1:21 PM

## 2011-09-17 NOTE — Progress Notes (Signed)
No further events overnight other than confusion which wife reports is baseline.   Exam:  Filed Vitals:   09/17/11 0400  BP: 178/91  Pulse: 77  Temp: 98 F (36.7 C)  Resp:    Mental status: Not oriented to place or year, but does know his wife.  CN: PERRL, EOMI, no dysarthria Motor: moves all extremities, increased tone in right arm and leg.  Sensory: Symmetric to light touch Reflex: + palmomental reflexes bilaterally  Impression: Syncope with subsequent confusion of unclear etiology. At this time, with a single event and normal EEG. I would not recommend starting AED therapy. MRI shows no new stroke. He is on ASA for stroke preventtion and this coupled with risk factor modification(BP control, Lipid management, BG control) would constitute his secondary  Prevention.   At this time, I have no further recommendations and will sign off.    Ritta Slot, MD [facility] [phone]

## 2011-09-17 NOTE — Discharge Summary (Signed)
Physician Discharge Summary  [name] ZOX:[id number] DOB: [birth year] DOA: 09/15/2011  PCP: Kirk Ruths, MD  Admit date: 09/15/2011 Discharge date: 09/17/2011  Recommendations for Outpatient Follow-up:  1. Patient's wife according to his PCP about resuming home health services  Discharge Diagnoses:  Principal Problem:  *Encephalopathy Active Problems:  CVA, old, speech/language deficit  CAD (coronary artery disease)  Anemia  Senile dementia with delirium  Dysphagia  HTN (hypertension), malignant  Failure to thrive in childhood  ARF (acute renal failure)  Chronic diastolic CHF (congestive heart failure)   Discharge Condition: Improved from initial admission, however is chronic advanced dementia  Diet recommendation: Heart healthy [employer] recommending aspiration precautions to the patient's wife  Filed Weights   09/15/11 2041  Weight: 57.063 kg (125 lb 12.8 oz)    History of present illness:  78 year old male with history of CVA with right upper extremity weakness, dementia, hypertension, hyperlipidemia, CAD status post stenting was brought to the ER after patient's wife found patient currently unresponsive at her home. She had just gone out of her house to pick the male and within a few minutes patient's mental status change. EMS was called and the patient was brought to the ER as a code stroke. Patient became more alert by the time patient reached ER. CT head was negative. There is a mention of left facial droop as witnessed by the EMS which also was not obvious when patient is the ER. Code stroke was canceled by the neurologist as patient's symptoms have resolved. Patient will be admitted for further monitoring and management. There was no seizure-like activity tongue bite or incontinence of urine. Patient was admitted to the hospital service for further workup.   Hospital Course:  *Encephalopathy: Difficult to say if seizure occurred. Stroke ruled out by MRI. His  issues may have been from some mild dehydration corrected with very gentle IV fluids. EEG was done and preliminary report looks to be unremarkable for any acute signs. As per the patient's wife, the patient is at his baseline. Active Problems:  CVA, old, speech/language deficit: No evidence of acute stroke.   CAD (coronary artery disease): Stable.  Anemia: Stable.   Senile dementia with delirium: Patient did have some episodes of acute agitation during his hospitalization. He required when necessary Ativan. The patient's wife and I both agreed that this is more from his dementia and being out of his home setting.  Dysphagia: Patient had a previous swallow screen during a previous hospitalization which recommended should the diet. We tried to get speech therapy to come see the patient prior to discharge, but the patient's wife did not want to wait too long. She plans to have one done as an outpatient according to patient's primary care physician   HTN (hypertension), malignant: We do not try to aggressively bring the patient's blood pressure down which was elevated in the 170s initially because of concerns of CVA. Once this was ruled out Mr. the patient back on his beta blocker but held his Altace because of her renal dysfunction when he came in. The plan will be to increase the patient's Lopressor from 50-75 mg by mouth daily and hold his Altace altogether. His PCP will followup and decide whether or not to change this.  ARF (acute renal failure): Patient presented with a mild increase in his creatinine at 1.5. With gentle IV fluids his creatinine came down to 1.14. See above.  Chronic diastolic CHF (congestive heart failure): We checked a BNP and it was minimally  elevated at 481 ( upper normal is 450). Accounting for renal dysfunction, this was felt to be stable.   Procedures:   EEG: Preliminary report looks to be unremarkable for any acute seizure activity   Consultations:  Dr.  Angela Nevin  Discharge Exam: Filed Vitals:   09/16/11 2022 09/17/11 0000 09/17/11 0143 09/17/11 0400  BP: 163/89 181/86 173/85 178/91  Pulse:  83 85 77  Temp:   99.1 F (37.3 C) 98 F (36.7 C)  TempSrc:   Oral Oral  Resp:   18   Height:      Weight:      SpO2:   99% 99%    General:  awake, advanced dementia so unable to determine orientation, he is nonverbal for me  Cardiovascular:  regular rate and rhythm, S1-S2  Respiratory:  clear to auscultation bilaterally   abdomen: Soft, nontender?, Nondistended, normoactive bowel sounds Extremities: No clubbing or cyanosis or edema  Discharge Instructions  Discharge Orders    Future Orders Please Complete By Expires   Diet - low sodium heart healthy      Increase activity slowly        Medication List  As of 09/17/2011  2:19 PM   STOP taking these medications         ramipril 10 MG tablet         TAKE these medications         ALPRAZolam 1 MG tablet   Commonly known as: XANAX   Take 1 mg by mouth at bedtime as needed. For anxiety      aspirin 81 MG tablet   Take 162 mg by mouth daily.      clonazePAM 0.5 MG tablet   Commonly known as: KLONOPIN   Take 0.5 tablets (0.25 mg total) by mouth 2 (two) times daily.      clonazePAM 0.5 MG tablet   Commonly known as: KLONOPIN   Take 0.5 mg by mouth 2 (two) times daily as needed.      docusate sodium 100 MG capsule   Commonly known as: COLACE   Take 100 mg by mouth 3 (three) times daily.      metoprolol 50 MG tablet   Commonly known as: LOPRESSOR   Take 1.5 tablets (75 mg total) by mouth daily.      omeprazole 20 MG capsule   Commonly known as: PRILOSEC   Take 20 mg by mouth daily.      simvastatin 40 MG tablet   Commonly known as: ZOCOR   Take 40 mg by mouth at bedtime.      Tamsulosin HCl 0.4 MG Caps   Commonly known as: FLOMAX   Take 0.4 mg by mouth daily.           Follow-up Information    Follow up with Kirk Ruths, MD in 2 weeks.    Contact information:   [address] [place] [address] [place] [address] [phone]           The results of significant diagnostics from this hospitalization (including imaging, microbiology, ancillary and laboratory) are listed below for reference.    Significant Diagnostic Studies: Ct Head Wo Contrast  09/15/2011  IMPRESSION: No intracranial hemorrhage or CT evidence of large acute infarct.  Prominent small vessel disease type changes.  This limits detection of small acute infarct.  Global atrophy.  Right maxillary sinus mucosal thickening.  Code stroke and discussed with Dr. Amada Jupiter 09/15/2011 3:34 pm.   Alver Fisher  Report Authenticated By: Fuller Canada, M.D.    Mri Brain Without Contrast  09/16/2011   IMPRESSION: No acute intracranial abnormality.  Stable MRI appearance of the brain since 2012.   Original Report Authenticated By: Harley Hallmark, M.D.    Dg Chest Port 1 View  09/15/2011   IMPRESSION:  1.  Cardiomegaly. 2. No evidence for acute cardiopulmonary  abnormality. 3.  Superior mediastinal mass, previously imaged.   Original Report Authenticated By: Patterson Hammersmith, M.D.      Labs: Basic Metabolic Panel:  Lab 09/17/11 1610 09/16/11 0216 09/15/11 1535 09/15/11 1522  NA 138 138 138 144  K 3.8 3.9 3.5 3.6  CL 107 108 109 111  CO2 20 19 20  --  GLUCOSE 65* 78 94 96  BUN 18 21 24* 25*  CREATININE 1.21 1.14 1.46* 1.50*  CALCIUM 9.2 9.3 9.0 --  MG -- -- -- --  PHOS -- -- -- --   Liver Function Tests:  Lab 09/17/11 0545 09/16/11 0216 09/15/11 1535  AST 56* 42* 43*  ALT 129* 115* 130*  ALKPHOS 85 84 87  BILITOT 1.2 0.7 0.7  PROT 6.6 6.5 6.5  ALBUMIN 3.3* 3.2* 3.2*    Lab 09/17/11 0545  AMMONIA 16   CBC:  Lab 09/16/11 0216 09/15/11 1535 09/15/11 1522  WBC 6.8 6.7 --  NEUTROABS -- 3.3 --  HGB 11.1* 10.7* 11.9*  HCT 32.8* 31.8* 35.0*  MCV 93.4 94.4 --  PLT 99* 120* --   Cardiac Enzymes:  Lab 09/16/11 0826 09/16/11 0216  09/15/11 2124 09/15/11 1535  CKTOTAL -- -- 57 --  CKMB -- -- -- --  CKMBINDEX -- -- -- --  TROPONINI <0.30 <0.30 <0.30 <0.30   BNP: BNP (last 3 results)  Basename 09/16/11 0826  PROBNP 481.3*   CBG:  Lab 09/17/11 1116 09/17/11 0822 09/17/11 0724 09/17/11 0518 09/16/11 2046  GLUCAP 71 112* 65* 63* 71    Time coordinating discharge: 30 minutes  Signed:  Hollice Espy  [facility] 09/17/2011, 2:19 PM

## 2011-09-17 NOTE — Progress Notes (Signed)
Hypoglycemic Event  CBG: 63  Treatment: Changed IV fluids to D5NS @20   Symptoms: None  Possible Reasons for Event: Inadequate meal intake, patient currently NPO.  Comments/MD notified: [facility] on-call paged. Received orders for fluids to be changed.    Clotilde Loth R, RN    Remember to initiate Hypoglycemia Order Set & complete

## 2011-09-20 ENCOUNTER — Encounter: Payer: Self-pay | Admitting: *Deleted

## 2011-12-13 ENCOUNTER — Inpatient Hospital Stay (HOSPITAL_COMMUNITY)
Admission: EM | Admit: 2011-12-13 | Discharge: 2011-12-20 | DRG: 070 | Disposition: A | Discharge status: Skilled Nursing Facility | Payer: Medicare Other | Attending: Internal Medicine | Admitting: Internal Medicine

## 2011-12-13 ENCOUNTER — Emergency Department (HOSPITAL_COMMUNITY): Payer: Medicare Other

## 2011-12-13 ENCOUNTER — Encounter (HOSPITAL_COMMUNITY): Payer: Self-pay | Admitting: *Deleted

## 2011-12-13 DIAGNOSIS — G9341 Metabolic encephalopathy: Principal | ICD-10-CM

## 2011-12-13 DIAGNOSIS — R1084 Generalized abdominal pain: Secondary | ICD-10-CM

## 2011-12-13 DIAGNOSIS — IMO0002 Reserved for concepts with insufficient information to code with codable children: Secondary | ICD-10-CM

## 2011-12-13 DIAGNOSIS — D649 Anemia, unspecified: Secondary | ICD-10-CM

## 2011-12-13 DIAGNOSIS — F0390 Unspecified dementia without behavioral disturbance: Secondary | ICD-10-CM

## 2011-12-13 DIAGNOSIS — R4182 Altered mental status, unspecified: Secondary | ICD-10-CM

## 2011-12-13 DIAGNOSIS — Q892 Congenital malformations of other endocrine glands: Secondary | ICD-10-CM

## 2011-12-13 DIAGNOSIS — I69328 Other speech and language deficits following cerebral infarction: Secondary | ICD-10-CM

## 2011-12-13 DIAGNOSIS — R531 Weakness: Secondary | ICD-10-CM

## 2011-12-13 DIAGNOSIS — G9340 Encephalopathy, unspecified: Secondary | ICD-10-CM

## 2011-12-13 DIAGNOSIS — I10 Essential (primary) hypertension: Secondary | ICD-10-CM

## 2011-12-13 DIAGNOSIS — R269 Unspecified abnormalities of gait and mobility: Secondary | ICD-10-CM

## 2011-12-13 DIAGNOSIS — R509 Fever, unspecified: Secondary | ICD-10-CM

## 2011-12-13 DIAGNOSIS — R7989 Other specified abnormal findings of blood chemistry: Secondary | ICD-10-CM

## 2011-12-13 DIAGNOSIS — R1310 Dysphagia, unspecified: Secondary | ICD-10-CM

## 2011-12-13 DIAGNOSIS — N289 Disorder of kidney and ureter, unspecified: Secondary | ICD-10-CM

## 2011-12-13 DIAGNOSIS — Z7982 Long term (current) use of aspirin: Secondary | ICD-10-CM

## 2011-12-13 DIAGNOSIS — I651 Occlusion and stenosis of basilar artery: Secondary | ICD-10-CM

## 2011-12-13 DIAGNOSIS — R627 Adult failure to thrive: Secondary | ICD-10-CM | POA: Diagnosis present

## 2011-12-13 DIAGNOSIS — I252 Old myocardial infarction: Secondary | ICD-10-CM

## 2011-12-13 DIAGNOSIS — R29898 Other symptoms and signs involving the musculoskeletal system: Secondary | ICD-10-CM

## 2011-12-13 DIAGNOSIS — K831 Obstruction of bile duct: Secondary | ICD-10-CM

## 2011-12-13 DIAGNOSIS — I5032 Chronic diastolic (congestive) heart failure: Secondary | ICD-10-CM

## 2011-12-13 DIAGNOSIS — R001 Bradycardia, unspecified: Secondary | ICD-10-CM

## 2011-12-13 DIAGNOSIS — N179 Acute kidney failure, unspecified: Secondary | ICD-10-CM

## 2011-12-13 DIAGNOSIS — Z9861 Coronary angioplasty status: Secondary | ICD-10-CM

## 2011-12-13 DIAGNOSIS — I2510 Atherosclerotic heart disease of native coronary artery without angina pectoris: Secondary | ICD-10-CM

## 2011-12-13 DIAGNOSIS — G2401 Drug induced subacute dyskinesia: Secondary | ICD-10-CM

## 2011-12-13 DIAGNOSIS — Z9181 History of falling: Secondary | ICD-10-CM

## 2011-12-13 DIAGNOSIS — R6251 Failure to thrive (child): Secondary | ICD-10-CM

## 2011-12-13 DIAGNOSIS — G5630 Lesion of radial nerve, unspecified upper limb: Secondary | ICD-10-CM

## 2011-12-13 DIAGNOSIS — D696 Thrombocytopenia, unspecified: Secondary | ICD-10-CM

## 2011-12-13 DIAGNOSIS — I6629 Occlusion and stenosis of unspecified posterior cerebral artery: Secondary | ICD-10-CM

## 2011-12-13 DIAGNOSIS — R5381 Other malaise: Secondary | ICD-10-CM | POA: Diagnosis present

## 2011-12-13 DIAGNOSIS — E870 Hyperosmolality and hypernatremia: Secondary | ICD-10-CM

## 2011-12-13 DIAGNOSIS — IMO0001 Reserved for inherently not codable concepts without codable children: Secondary | ICD-10-CM

## 2011-12-13 DIAGNOSIS — Z79899 Other long term (current) drug therapy: Secondary | ICD-10-CM

## 2011-12-13 DIAGNOSIS — R1312 Dysphagia, oropharyngeal phase: Secondary | ICD-10-CM | POA: Diagnosis present

## 2011-12-13 DIAGNOSIS — E785 Hyperlipidemia, unspecified: Secondary | ICD-10-CM | POA: Diagnosis present

## 2011-12-13 DIAGNOSIS — I69998 Other sequelae following unspecified cerebrovascular disease: Secondary | ICD-10-CM

## 2011-12-13 DIAGNOSIS — R296 Repeated falls: Secondary | ICD-10-CM

## 2011-12-13 DIAGNOSIS — E43 Unspecified severe protein-calorie malnutrition: Secondary | ICD-10-CM | POA: Diagnosis present

## 2011-12-13 DIAGNOSIS — E876 Hypokalemia: Secondary | ICD-10-CM

## 2011-12-13 LAB — URINALYSIS, ROUTINE W REFLEX MICROSCOPIC
Glucose, UA: NEGATIVE mg/dL
Ketones, ur: NEGATIVE mg/dL
Leukocytes, UA: NEGATIVE
Nitrite: NEGATIVE
Specific Gravity, Urine: >1.03 — ABNORMAL HIGH (ref 1.005–1.030)

## 2011-12-13 LAB — BASIC METABOLIC PANEL
BUN: 33 mg/dL — ABNORMAL HIGH (ref 6–23)
Chloride: 121 mEq/L — ABNORMAL HIGH (ref 96–112)
Creatinine, Ser: 1.61 mg/dL — ABNORMAL HIGH (ref 0.50–1.35)
GFR calc Af Amer: 45 mL/min — ABNORMAL LOW (ref >90)
GFR calc non Af Amer: 39 mL/min — ABNORMAL LOW (ref >90)

## 2011-12-13 LAB — CBC WITH DIFFERENTIAL/PLATELET
Eosinophils Relative: 1 % (ref 0–5)
HCT: 42.7 % (ref 39.0–52.0)
Lymphocytes Relative: 13 % (ref 12–46)
Lymphs Abs: 1.1 10*3/uL (ref 0.7–4.0)
MCV: 100.5 fL — ABNORMAL HIGH (ref 78.0–100.0)
Monocytes Absolute: 1 10*3/uL (ref 0.1–1.0)
RBC: 4.25 MIL/uL (ref 4.22–5.81)
WBC: 8.2 10*3/uL (ref 4.0–10.5)

## 2011-12-13 LAB — URINE MICROSCOPIC-ADD ON

## 2011-12-13 LAB — PROTIME-INR
INR: 1.13 (ref 0.00–1.49)
Prothrombin Time: 14.3 seconds (ref 11.6–15.2)

## 2011-12-13 LAB — COMPREHENSIVE METABOLIC PANEL
CO2: 20 mEq/L (ref 19–32)
Calcium: 10 mg/dL (ref 8.4–10.5)
Creatinine, Ser: 1.76 mg/dL — ABNORMAL HIGH (ref 0.50–1.35)
GFR calc Af Amer: 40 mL/min — ABNORMAL LOW (ref >90)
GFR calc non Af Amer: 35 mL/min — ABNORMAL LOW (ref >90)
Glucose, Bld: 135 mg/dL — ABNORMAL HIGH (ref 70–99)

## 2011-12-13 MED ORDER — SODIUM CHLORIDE 0.9 % IV SOLN
1000.0000 mL | Freq: Once | INTRAVENOUS | Status: AC
  Administered 2011-12-13: 1000 mL via INTRAVENOUS

## 2011-12-13 MED ORDER — DOCUSATE SODIUM 100 MG PO CAPS
100.0000 mg | ORAL_CAPSULE | Freq: Three times a day (TID) | ORAL | Status: DC
  Administered 2011-12-13 – 2011-12-14 (×2): 100 mg via ORAL
  Filled 2011-12-13 (×3): qty 1

## 2011-12-13 MED ORDER — HALOPERIDOL LACTATE 5 MG/ML IJ SOLN
2.2000 mg | Freq: Four times a day (QID) | INTRAMUSCULAR | Status: DC | PRN
  Administered 2011-12-14 – 2011-12-15 (×2): 5 mg via INTRAVENOUS
  Administered 2011-12-17 – 2011-12-18 (×2): 2.5 mg via INTRAVENOUS
  Administered 2011-12-18: 2.2 mg via INTRAVENOUS
  Administered 2011-12-19 (×2): 5 mg via INTRAVENOUS
  Filled 2011-12-13 (×7): qty 1

## 2011-12-13 MED ORDER — FLEET ENEMA 7-19 GM/118ML RE ENEM: 1.0000 | ENEMA | Freq: Once | RECTAL | Status: AC | PRN

## 2011-12-13 MED ORDER — ENOXAPARIN SODIUM 30 MG/0.3ML ~~LOC~~ SOLN
30.0000 mg | SUBCUTANEOUS | Status: DC
  Administered 2011-12-14: 30 mg via SUBCUTANEOUS
  Filled 2011-12-13: qty 0.3

## 2011-12-13 MED ORDER — POLYETHYLENE GLYCOL 3350 17 G PO PACK: 17.0000 g | PACK | Freq: Every day | ORAL | Status: DC | PRN

## 2011-12-13 MED ORDER — ONDANSETRON HCL 4 MG/2ML IJ SOLN: 4.0000 mg | INTRAMUSCULAR | Status: DC | PRN

## 2011-12-13 MED ORDER — BISACODYL 10 MG RE SUPP
10.0000 mg | Freq: Every day | RECTAL | Status: DC | PRN
  Administered 2011-12-16: 10 mg via RECTAL
  Filled 2011-12-13: qty 1

## 2011-12-13 MED ORDER — ASPIRIN 81 MG PO TABS
162.0000 mg | ORAL_TABLET | Freq: Every day | ORAL | Status: DC
  Filled 2011-12-13: qty 2

## 2011-12-13 MED ORDER — TRAZODONE HCL 50 MG PO TABS: 25.0000 mg | ORAL_TABLET | Freq: Every evening | ORAL | Status: DC | PRN

## 2011-12-13 MED ORDER — PANTOPRAZOLE SODIUM 40 MG PO TBEC
40.0000 mg | DELAYED_RELEASE_TABLET | Freq: Every day | ORAL | Status: DC
  Administered 2011-12-14: 40 mg via ORAL
  Filled 2011-12-13 (×2): qty 1

## 2011-12-13 MED ORDER — HALOPERIDOL LACTATE 5 MG/ML IJ SOLN
5.0000 mg | Freq: Four times a day (QID) | INTRAMUSCULAR | Status: DC | PRN
  Administered 2011-12-13: 5 mg via INTRAVENOUS
  Filled 2011-12-13: qty 1

## 2011-12-13 MED ORDER — SODIUM CHLORIDE 0.9 % IJ SOLN
3.0000 mL | Freq: Two times a day (BID) | INTRAMUSCULAR | Status: DC
  Administered 2011-12-13 – 2011-12-15 (×2): 3 mL via INTRAVENOUS

## 2011-12-13 MED ORDER — SODIUM CHLORIDE 0.9 % IV SOLN
1000.0000 mL | INTRAVENOUS | Status: DC
  Administered 2011-12-13: 1000 mL via INTRAVENOUS

## 2011-12-13 MED ORDER — DEXTROSE 5 % IV SOLN
INTRAVENOUS | Status: DC
  Administered 2011-12-13: 1000 mL via INTRAVENOUS

## 2011-12-13 MED ORDER — ACETAMINOPHEN 500 MG PO TABS: 500.0000 mg | ORAL_TABLET | Freq: Four times a day (QID) | ORAL | Status: DC | PRN

## 2011-12-13 MED ORDER — SIMVASTATIN 20 MG PO TABS
40.0000 mg | ORAL_TABLET | Freq: Every day | ORAL | Status: DC
  Administered 2011-12-13: 40 mg via ORAL
  Filled 2011-12-13: qty 2

## 2011-12-13 NOTE — H&P (Signed)
[facility] History and Physical  [name]  ZOX:[id number]  DOB: [birth year]   DOA: 12/13/2011   PCP:   Kirk Ruths, MD   Chief Complaint:  Confusion worsening for the past 3 days  HPI: [name] is an 78 y.o. male.   African American gentleman living with dementia, is brought in by his wife, with strong support from his church community, complaining of the above symptoms.  There is no history of fever cough or cold chest pains or shortness of breath; no frequency or dysuria no nausea vomiting or diarrhea. His wife insists he is been eating and drinking as usual, but in the emergency room patient's lab work reveals evidence of severe dehydration and hypernatremia and the hospitalist service was called to assist with management.  Patient has received boluses of normal saline in the emergency room.  He is currently alert but somewhat delirious and unable to contribute to the history.   Rewiew of Systems:   According to his wife.  All systems negative except as marked bold or noted in the HPI;  Constitutional: Negative for malaise, fever and chills. ;  Eyes: Negative for eye pain, redness and discharge. ;  ENMT: Negative for ear pain, hoarseness, nasal congestion, sinus pressure and sore throat. ;  Cardiovascular: Negative for chest pain, palpitations, diaphoresis, dyspnea and peripheral edema. ;  Respiratory: Negative for cough, hemoptysis, wheezing and stridor. ;  Gastrointestinal: Negative for nausea, vomiting, diarrhea, constipation, abdominal pain, melena, blood in stool, hematemesis, jaundice and rectal bleeding. .   Genitourinary: Negative for frequency, dysuria, incontinence,flank pain and hematuria; Musculoskeletal: Negative for back pain and neck pain. Negative for swelling and trauma.;  Skin: . Negative for pruritus, rash, abrasions, bruising and skin lesion.; ulcerations Neuro: Negative for headache, lightheadedness and neck stiffness. Negative for  weakness, altered level of consciousness , altered mental status, extremity weakness, burning feet,  seizure and syncope.  Psych: negative for anxiety, depression, insomnia, tearfulness, panic attacks, hallucinations, paranoia, suicidal or homicidal ideation    Past Medical History  Diagnosis Date  . Stroke   . Hypertension   . Coronary artery disease 09/2004    With stenting  . Frequent falls 12/29/2010  . Dementia   . Anemia 12/29/2010  . Thrombocytopenia 12/29/2010  . Senile dementia with delirium 12/29/2010  . 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 Cerebellar artery occlusion or stenosis 12/29/2010  . Basilar artery stenosis 12/29/2010  . Thyroglossal duct cyst   . Dysphagia 12/31/2010  . Tardive dyskinesia 12/31/2010  . Radial neuropathy 01/01/2011  . Encephalopathy     Past Surgical History  Procedure Date  . Cardiac surgery   . Coronary angioplasty     Medications:  HOME MEDS: Prior to Admission medications   Medication Sig Start Date End Date Taking? Authorizing Provider  aspirin 81 MG tablet Take 162 mg by mouth daily.    Yes Historical Provider, MD  clonazePAM (KLONOPIN) 0.5 MG tablet Take 0.25 mg by mouth 3 (three) times daily. Family member states if patient has to take Alprazolam, she does not give him any Clonazepam   Yes Historical Provider, MD  docusate sodium (COLACE) 100 MG capsule Take 100 mg by mouth 3 (three) times daily.     Yes Historical Provider, MD  megestrol (MEGACE) 40 MG/ML suspension Take 400 mg by mouth daily.   Yes Historical Provider, MD  metoprolol (LOPRESSOR) 50 MG tablet Take 50 mg by mouth daily. 09/17/11  Yes Sendil  Mordecai Rasmussen, MD  omeprazole (PRILOSEC) 20 MG capsule Take 20 mg by mouth daily.     Yes Historical Provider, MD  ramipril (ALTACE) 10 MG capsule Take 10 mg by mouth daily.   Yes Historical Provider, MD  simvastatin (ZOCOR) 40 MG tablet Take 40 mg by mouth at bedtime.     Yes  Historical Provider, MD  Tamsulosin HCl (FLOMAX) 0.4 MG CAPS Take 0.4 mg by mouth daily.   Yes Historical Provider, MD  ALPRAZolam Prudy Feeler) 1 MG tablet Take 1 mg by mouth 3 (three) times daily as needed. For anxiety    Historical Provider, MD     Allergies:  No Known Allergies  Social History:   reports that he has never smoked. He has never used smokeless tobacco. He reports that he does not drink alcohol or use illicit drugs.  Family History: Family History  Problem Relation Age of Onset  . Cancer Father     throat  . Stroke Mother   . Stroke Brother      Physical Exam: Filed Vitals:   12/13/11 1439 12/13/11 1730  BP: 104/59 120/91  Pulse:  100  Temp: 97.6 F (36.4 C)   TempSrc: Oral   Resp: 20 14  Height: 5\' 3"  (1.6 m)   Weight: 54.432 kg (120 lb)   SpO2: 97% 100%   Blood pressure 120/91, pulse 100, temperature 97.6 F (36.4 C), temperature source Oral, resp. rate 14, height 5\' 3"  (1.6 m), weight 54.432 kg (120 lb), SpO2 100.00%.  GEN:  Pleasant but confused elderly African American gentleman sitting up  in the stretcher in no acute distress; cooperative with exam PSYCH:  alert and oriented x1;  HEENT: Mucous membranes pink, markedly dry,  and anicteric; PERRLA; EOM intact; no cervical lymphadenopathy 5 cm thyroglossal cyst noted, no carotid bruit; no JVD; Breasts:: Not examined CHEST WALL: No tenderness CHEST: Normal respiration, clear to auscultation bilaterally HEART: Regular rate and rhythm; no murmurs rubs or gallops BACK: Mild kyphosis or scoliosis; no CVA tenderness ABDOMEN: soft non-tender; no masses, no organomegaly, normal abdominal bowel sounds; no pannus; no intertriginous candida. Rectal Exam: Not done EXTREMITIES:  age-appropriate arthropathy of the hands and knees; wasting of the small muscles of the hand no edema; no ulcerations. Genitalia: not examined PULSES: 2+ and symmetric SKIN: Normal hydration no rash or ulceration CNS: Cranial nerves 2-12  grossly intact no focal lateralizing neurologic deficit   Labs on Admission:  Basic Metabolic Panel:  Lab 12/13/11 1610 12/13/11 1453  NA 153* 152*  K 4.1 3.9  CL 121* 118*  CO2 22 20  GLUCOSE 78 135*  BUN 33* 33*  CREATININE 1.61* 1.76*  CALCIUM 9.1 10.0  MG -- --  PHOS -- --   Liver Function Tests:  Lab 12/13/11 1453  AST 80*  ALT 191*  ALKPHOS 154*  BILITOT 0.9  PROT 7.9  ALBUMIN 3.7    Lab 12/13/11 1453  LIPASE 25  AMYLASE --   No results found for this basename: AMMONIA:5 in the last 168 hours CBC:  Lab 12/13/11 1453  WBC 8.2  NEUTROABS 6.1  HGB 13.7  HCT 42.7  MCV 100.5*  PLT 147*   Cardiac Enzymes: No results found for this basename: CKTOTAL:5,CKMB:5,CKMBINDEX:5,TROPONINI:5 in the last 168 hours BNP: No components found with this basename: POCBNP:5 D-dimer: No components found with this basename: D-DIMER:5 CBG: No results found for this basename: GLUCAP:5 in the last 168 hours  Radiological Exams on Admission: Dg Chest 1 View  12/13/2011  *RADIOLOGY REPORT*  Clinical Data: Altered mental status  CHEST - 1 VIEW  Comparison: 09/15/2011 and 12/13/2011  Findings: Cardiomediastinal silhouette is stable.  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  No acute infiltrate or pleural effusion.  No pulmonary edema. Atherosclerotic calcifications of thoracic aorta again noted.  IMPRESSION: No active disease.  No significant change.   Original Report Authenticated By: Natasha Mead, M.D.    Ct Head Wo Contrast  12/13/2011  *RADIOLOGY REPORT*  Clinical Data: Dementia.  Weakness.  Altered mental status. Hypertension.  CT HEAD WITHOUT CONTRAST  Technique:  Contiguous axial images were obtained from the base of the skull through the vertex without contrast.  Comparison: 09/16/2011 MR.  09/15/2011 CT.  Findings: No intracranial hemorrhage.  Significant confluent white matter type changes periventricular and subcortical region.  This limits detection of small acute infarct. No CT  evidence of large acute infarct.  Global atrophy without hydrocephalus.  No intracranial mass lesion detected on this unenhanced exam.  Vascular calcifications.  Polypoid opacification right maxillary sinus.  IMPRESSION: No intracranial hemorrhage.  Significant small vessel disease type changes limits detection of small acute infarct however, no CT evidence of large acute infarct.   Original Report Authenticated By: Lacy Duverney, M.D.    US Abdomen Limited Ruq  12/13/2011  *RADIOLOGY REPORT*  Clinical Data:  Elevated LFTs, question cholelithiasis  LIMITED ABDOMINAL ULTRASOUND - RIGHT UPPER QUADRANT  Comparison:  None  Findings:  Gallbladder:  Suboptimally distended.  Small amount of probable dependent sludge with mild wall thickening, which could be due to edema or to underdistension.  No sonographic Murphy's sign or definite pericholecystic fluid.  No definite discrete shadowing calculi identified.  Common bile duct:  Dilated 11 mm diameter.  Questionable intraluminal echogenic focus within CBD, question artifact, unable to exclude choledocolithiasis.  Liver:  Normal parenchymal echogenicity.  Intrahepatic biliary dilatation.  No focal mass lesion.  No right upper quadrant ascites identified. Visualized portion of pancreas unremarkable.  IMPRESSION: Intrahepatic and extrahepatic biliary dilatation with questionable intraluminal echogenic focus/calculus versus artifact. In the setting of elevated LFTs, consider follow-up MRCP imaging. Incompletely distended gallbladder with a slightly thickened wall and likely dependent sludge. No definite gallstones or sonographic Murphy's sign identified.                    Original Report Authenticated By: Ulyses Southward, M.D.     EKG: Independently reviewed. Normal sinus rhythm, left ventricular hypertrophy, no acute ST segment changes   Assessment/Plan Present on Admission:  . Hypernatremia, . Metabolic encephalopathy, . ARF (acute renal failure) all due to dehydration    . Elevated LFTs, with abnormal ultrasound, and no abdominal tenderness, questionable tumor  . CAD (coronary artery disease) . Senile dementia with delirium   PLAN: We will admit this gentleman for moderate hydration with free water/D5 W. Will check serum chemistry every 4 hours , to ensure there is not too rapid a fall in his serum sodium, although we feel this is a likely an acute event. We have a view to evaluation of his liver and gallbladder when his renal function and mental status returned to baseline.  You'll need to be sedated for initial management, and his wife acknowledges that she usually becomes extremely disoriented when admitted to hospital, and recommends we sedate him as much as possible when necessary. This sedation will likely only been needed in the lower hospital course; will use Haldol, and hold his benzodiazepines for the time being  Will continue treatment of  his blood pressure to the extent that this cardiovascular system will tolerate, but will hold ACE inhibitors till renal function is normalized  Continue management of chronic medical problem  Other plans as per orders.  Code Status: FULL CODE  Family Communication: His wife present at the interview and examination; church pastor other church members also present Disposition Plan: Depending on response to therapy    Lalena Salas Nocturnist [facility] Pager [phone]  12/13/2011, 10:38 PM

## 2011-12-13 NOTE — ED Notes (Signed)
Spoke with home health nurse who states she evaluated pt today - states [name] is normally able to stand by himself and have meaningful conversation, even though he has dementia.  States today he "is like a bag of bones", more confused and off balance.  Per wife, pt has been having these symptoms x3 days. Pt required assistance of 3 to transfer from wheelchair to stretcher.  Home Health nurse also reports she assessed an apical pulse of 130 this afternoon. Pt presently has SR rate of 83 on cardiac monitor.

## 2011-12-13 NOTE — ED Provider Notes (Signed)
History     CSN: [id number]  Arrival date & time 12/13/11  1426   First MD Initiated Contact with Patient 12/13/11 1435      Chief Complaint  Patient presents with  . Altered Mental Status  . Weakness   Level V caveat for dementia  (Consider location/radiation/quality/duration/timing/severity/associated sxs/prior treatment) HPI  Per patient's wife 2 days ago he was sitting and all of a sudden he became unresponsive for about 3-4 minutes. She states his head was nodding to the side and she could not get him to wake up. She did not notice any diaphoresis or color changes and states he did not appear to be short of breath. She states since then he's been more drowsy than usual and sleepy and not as responsive as usual. Before that he would walk with assistance  using a walker however he is having difficulty doing that now. She states he has a normal appetite and he is having normal urination. She states that he does not have normal bowel movements daily can go several days between episodes. She also states she's had a mild hacking cough for about a week without fever. She denies him complaining of pain and patient denies having any pain.  PCP Dr. Regino Schultze  Past Medical History  Diagnosis Date  . Stroke   . Hypertension   . Coronary artery disease 09/2004    With stenting  . Frequent falls 12/29/2010  . Dementia   . Anemia 12/29/2010  . Thrombocytopenia 12/29/2010  . Senile dementia with delirium 12/29/2010  . 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 Cerebellar artery occlusion or stenosis 12/29/2010  . Basilar artery stenosis 12/29/2010  . Thyroglossal duct cyst   . Dysphagia 12/31/2010  . Tardive dyskinesia 12/31/2010  . Radial neuropathy 01/01/2011  . Encephalopathy     Past Surgical History  Procedure Date  . Cardiac surgery   . Coronary angioplasty     No family history on file.  History  Substance Use Topics  . Smoking  status: Never Smoker   . Smokeless tobacco: Never Used  . Alcohol Use: No   Lives at home Lives with spouse  Review of Systems  Unable to perform ROS: Dementia    Allergies  Review of patient's allergies indicates no known allergies.  Home Medications   Current Outpatient Rx  Name  Route  Sig  Dispense  Refill  . ASPIRIN 81 MG PO TABS   Oral   Take 162 mg by mouth daily.          Marland Kitchen CLONAZEPAM 0.5 MG PO TABS   Oral   Take 0.25 mg by mouth 3 (three) times daily. Family member states if patient has to take Alprazolam, she does not give him any Clonazepam         . DOCUSATE SODIUM 100 MG PO CAPS   Oral   Take 100 mg by mouth 3 (three) times daily.           . MEGESTROL ACETATE 40 MG/ML PO SUSP   Oral   Take 400 mg by mouth daily.         Marland Kitchen METOPROLOL TARTRATE 50 MG PO TABS   Oral   Take 50 mg by mouth daily.         Marland Kitchen OMEPRAZOLE 20 MG PO CPDR   Oral   Take 20 mg by mouth daily.           Marland Kitchen RAMIPRIL 10  MG PO CAPS   Oral   Take 10 mg by mouth daily.         Marland Kitchen SIMVASTATIN 40 MG PO TABS   Oral   Take 40 mg by mouth at bedtime.           . TAMSULOSIN HCL 0.4 MG PO CAPS   Oral   Take 0.4 mg by mouth daily.         Marland Kitchen ALPRAZOLAM 1 MG PO TABS   Oral   Take 1 mg by mouth 3 (three) times daily as needed. For anxiety           BP 104/59  Temp 97.6 F (36.4 C) (Oral)  Resp 20  Ht 5\' 3"  (1.6 m)  Wt 120 lb (54.432 kg)  BMI 21.26 kg/m2  SpO2 97%  Vital signs normal   HR 80 - 86 on monitor during my exam.  Physical Exam  Nursing note and vitals reviewed. Constitutional: He appears well-developed and well-nourished.  Non-toxic appearance. He does not appear ill. No distress.  HENT:  Head: Normocephalic and atraumatic.  Right Ear: External ear normal.  Left Ear: External ear normal.  Nose: Nose normal. No mucosal edema or rhinorrhea.  Mouth/Throat: Oropharynx is clear and moist and mucous membranes are normal. No dental abscesses or uvula  swelling.  Eyes: Conjunctivae normal and EOM are normal. Pupils are equal, round, and reactive to light.  Neck: Normal range of motion and full passive range of motion without pain. Neck supple.       Large soft swollen mass in the supraclavicular area but he has had since childhood  Cardiovascular: Normal rate, regular rhythm and normal heart sounds.  Exam reveals no gallop and no friction rub.   No murmur heard. Pulmonary/Chest: Effort normal and breath sounds normal. No respiratory distress. He has no wheezes. He has no rhonchi. He has no rales. He exhibits no tenderness and no crepitus.  Abdominal: Soft. Normal appearance and bowel sounds are normal. He exhibits no distension. There is no tenderness. There is no rebound and no guarding.  Musculoskeletal: Normal range of motion. He exhibits no edema and no tenderness.       Moves all extremities well.   Neurological: He has normal strength. No cranial nerve deficit.       The patient is slightly sleepy, his speech is mildly slurred. He  follows some commands but others he will not follow. His face appears symmetrical, he is diffusely weak in his legs and arms, his speech is mildly slurred.   Skin: Skin is warm, dry and intact. No rash noted. No erythema. No pallor.  Psychiatric: His speech is normal. His mood appears not anxious.       Flat affect    ED Course  Procedures (including critical care time)   Medications  0.9 %  sodium chloride infusion (not administered)    Followed by  0.9 %  sodium chloride infusion (not administered)     Nurse reports unable to pass foley for in and out b/o prostate enlargement.   Pt started on IV fluids for his hypernatremic dehydration which is probably the etiology of his change in mental status.   16:10 at change of shift Dr Adriana Simas will get xray study results and get patient admitted.   Results for orders placed during the hospital encounter of 12/13/11  CBC WITH DIFFERENTIAL      Component  Value Range   WBC 8.2  4.0 - 10.5 K/uL  RBC 4.25  4.22 - 5.81 MIL/uL   Hemoglobin 13.7  13.0 - 17.0 g/dL   HCT 56.3  87.5 - 64.3 %   MCV 100.5 (*) 78.0 - 100.0 fL   MCH 32.2  26.0 - 34.0 pg   MCHC 32.1  30.0 - 36.0 g/dL   RDW 32.9  51.8 - 84.1 %   Platelets 147 (*) 150 - 400 K/uL   Neutrophils Relative 74  43 - 77 %   Neutro Abs 6.1  1.7 - 7.7 K/uL   Lymphocytes Relative 13  12 - 46 %   Lymphs Abs 1.1  0.7 - 4.0 K/uL   Monocytes Relative 12  3 - 12 %   Monocytes Absolute 1.0  0.1 - 1.0 K/uL   Eosinophils Relative 1  0 - 5 %   Eosinophils Absolute 0.1  0.0 - 0.7 K/uL   Basophils Relative 1  0 - 1 %   Basophils Absolute 0.1  0.0 - 0.1 K/uL  COMPREHENSIVE METABOLIC PANEL      Component Value Range   Sodium 152 (*) 135 - 145 mEq/L   Potassium 3.9  3.5 - 5.1 mEq/L   Chloride 118 (*) 96 - 112 mEq/L   CO2 20  19 - 32 mEq/L   Glucose, Bld 135 (*) 70 - 99 mg/dL   BUN 33 (*) 6 - 23 mg/dL   Creatinine, Ser 6.60 (*) 0.50 - 1.35 mg/dL   Calcium 63.0  8.4 - 16.0 mg/dL   Total Protein 7.9  6.0 - 8.3 g/dL   Albumin 3.7  3.5 - 5.2 g/dL   AST 80 (*) 0 - 37 U/L   ALT 191 (*) 0 - 53 U/L   Alkaline Phosphatase 154 (*) 39 - 117 U/L   Total Bilirubin 0.9  0.3 - 1.2 mg/dL   GFR calc non Af Amer 35 (*) >90 mL/min   GFR calc Af Amer 40 (*) >90 mL/min  APTT      Component Value Range   aPTT 29  24 - 37 seconds  PROTIME-INR      Component Value Range   Prothrombin Time 14.3  11.6 - 15.2 seconds   INR 1.13  0.00 - 1.49    Laboratory interpretation all normal except new hypernatremia, elevated chloride, renal insufficiency, and new elevation of LFTs.   Ct Head Wo Contrast  12/13/2011  *RADIOLOGY REPORT*  Clinical Data: Dementia.  Weakness.  Altered mental status. Hypertension.  CT HEAD WITHOUT CONTRAST  Technique:  Contiguous axial images were obtained from the base of the skull through the vertex without contrast.  Comparison: 09/16/2011 MR.  09/15/2011 CT.  Findings: No intracranial  hemorrhage.  Significant confluent white matter type changes periventricular and subcortical region.  This limits detection of small acute infarct. No CT evidence of large acute infarct.  Global atrophy without hydrocephalus.  No intracranial mass lesion detected on this unenhanced exam.  Vascular calcifications.  Polypoid opacification right maxillary sinus.  IMPRESSION: No intracranial hemorrhage.  Significant small vessel disease type changes limits detection of small acute infarct however, no CT evidence of large acute infarct.   Original Report Authenticated By: Lacy Duverney, M.D.    US Abdomen and Chest Xray pending.     Date: 12/13/2011  Rate: 82  Rhythm: normal sinus rhythm  QRS Axis: normal  Intervals: normal  ST/T Wave abnormalities: nonspecific T wave changes  Conduction Disutrbances:LVH  Narrative Interpretation:   Old EKG Reviewed: none available    1. Weakness  2. Hypernatremia   3. Chloride, increased level   4. Renal insufficiency   5. Elevated LFTs   6. Altered mental state       MDM since September patient now has significant hypernatremia and high chloride consistent with dehydration, a new renal insufficiency also consistent with dehydration and new elevation of his LFTs of uncertain etiology. Patient will require admission and IV fluids.      Plan admission Dr Adriana Simas will arrange after [place] and CXR resulted.    Devoria Albe, MD, FACEP  CRITICAL CARE Performed by: Ward Givens   Total critical care time: 35 min  Critical care time was exclusive of separately billable procedures and treating other patients.  Critical care was necessary to treat or prevent imminent or life-threatening deterioration.  Critical care was time spent personally by me on the following activities: development of treatment plan with patient and/or surrogate as well as nursing, discussions with consultants, evaluation of patient's response to treatment, examination of patient,  obtaining history from patient or surrogate, ordering and performing treatments and interventions, ordering and review of laboratory studies, ordering and review of radiographic studies, pulse oximetry and re-evaluation of patient's condition.   Ward Givens, MD 12/14/11 [phone]

## 2011-12-13 NOTE — ED Notes (Signed)
Placed condom cath on pt per dr knapp after in/out cath was unsuccessful.

## 2011-12-14 ENCOUNTER — Inpatient Hospital Stay (HOSPITAL_COMMUNITY): Payer: Medicare Other

## 2011-12-14 DIAGNOSIS — R5383 Other fatigue: Secondary | ICD-10-CM

## 2011-12-14 DIAGNOSIS — R5381 Other malaise: Secondary | ICD-10-CM

## 2011-12-14 DIAGNOSIS — F0390 Unspecified dementia without behavioral disturbance: Secondary | ICD-10-CM

## 2011-12-14 DIAGNOSIS — R7989 Other specified abnormal findings of blood chemistry: Secondary | ICD-10-CM

## 2011-12-14 DIAGNOSIS — R740 Nonspecific elevation of levels of transaminase and lactic acid dehydrogenase [LDH]: Secondary | ICD-10-CM

## 2011-12-14 DIAGNOSIS — K831 Obstruction of bile duct: Secondary | ICD-10-CM

## 2011-12-14 DIAGNOSIS — R4182 Altered mental status, unspecified: Secondary | ICD-10-CM

## 2011-12-14 DIAGNOSIS — K838 Other specified diseases of biliary tract: Secondary | ICD-10-CM

## 2011-12-14 LAB — COMPREHENSIVE METABOLIC PANEL
AST: 57 U/L — ABNORMAL HIGH (ref 0–37)
CO2: 20 mEq/L (ref 19–32)
Chloride: 119 mEq/L — ABNORMAL HIGH (ref 96–112)
Creatinine, Ser: 1.45 mg/dL — ABNORMAL HIGH (ref 0.50–1.35)
GFR calc non Af Amer: 44 mL/min — ABNORMAL LOW (ref >90)
Total Bilirubin: 0.9 mg/dL (ref 0.3–1.2)

## 2011-12-14 LAB — BASIC METABOLIC PANEL
BUN: 31 mg/dL — ABNORMAL HIGH (ref 6–23)
CO2: 18 mEq/L — ABNORMAL LOW (ref 19–32)
Calcium: 8.8 mg/dL (ref 8.4–10.5)
Calcium: 9.1 mg/dL (ref 8.4–10.5)
Creatinine, Ser: 1.47 mg/dL — ABNORMAL HIGH (ref 0.50–1.35)
Creatinine, Ser: 1.5 mg/dL — ABNORMAL HIGH (ref 0.50–1.35)
GFR calc non Af Amer: 43 mL/min — ABNORMAL LOW (ref >90)
Glucose, Bld: 86 mg/dL (ref 70–99)
Sodium: 146 mEq/L — ABNORMAL HIGH (ref 135–145)

## 2011-12-14 LAB — CBC
HCT: 35.7 % — ABNORMAL LOW (ref 39.0–52.0)
Hemoglobin: 11.5 g/dL — ABNORMAL LOW (ref 13.0–17.0)
MCV: 99.2 fL (ref 78.0–100.0)
RBC: 3.6 MIL/uL — ABNORMAL LOW (ref 4.22–5.81)
WBC: 9.9 10*3/uL (ref 4.0–10.5)

## 2011-12-14 MED ORDER — POTASSIUM CL IN DEXTROSE 5% 20 MEQ/L IV SOLN
20.0000 meq | INTRAVENOUS | Status: DC
  Administered 2011-12-14: 20 meq via INTRAVENOUS

## 2011-12-14 MED ORDER — ASPIRIN EC 81 MG PO TBEC
162.0000 mg | DELAYED_RELEASE_TABLET | Freq: Every day | ORAL | Status: DC
  Administered 2011-12-14: 162 mg via ORAL
  Filled 2011-12-14: qty 2

## 2011-12-14 MED ORDER — SODIUM CHLORIDE 0.45 % IV SOLN
INTRAVENOUS | Status: DC
  Administered 2011-12-14 – 2011-12-17 (×4): via INTRAVENOUS
  Filled 2011-12-14 (×9): qty 1000

## 2011-12-14 MED ORDER — DEXTROSE 5 % IV SOLN
1.0000 g | INTRAVENOUS | Status: DC
  Administered 2011-12-14 – 2011-12-15 (×2): 1 g via INTRAVENOUS
  Filled 2011-12-14 (×5): qty 10

## 2011-12-14 NOTE — Evaluation (Signed)
Occupational Therapy Evaluation Patient Details Name: Ryan CHANDRAN MRN: [id number] DOB: 1929-05-28 Today's Date: 12/14/2011 Time: 4098-1191 OT Time Calculation (min): 16 min  OT Assessment / Plan / Recommendation Clinical Impression  Patient is a 78 y/o male s/p Metabolic Encephalopathy presenting to acute OT with deficits below. Patient has difficutly with following directions, safety awareness, decreased ADL performance, and functional transfers.  Wife states that normally patient is at a Supervision level at home and she would  him to return to baseline in order to return home. Recommend SNF at D/C.    OT Assessment  Patient needs continued OT Services    Follow Up Recommendations  SNF       Equipment Recommendations  None recommended by OT       Frequency  Min 2X/week    Precautions / Restrictions Precautions Precautions: Fall Restrictions Weight Bearing Restrictions: No   Pertinent Vitals/Pain No complaints.    ADL  Grooming: Wash/dry face;Performed;Set up Where Assessed - Grooming: Supported sitting Upper Body Bathing: Performed;Maximal assistance Where Assessed - Upper Body Bathing: Supported sitting Lower Body Bathing: Performed;+2 Total assistance Lower Body Bathing: Patient Percentage: 0% Where Assessed - Lower Body Bathing: Supported sit to stand Toilet Transfer: Performed Statistician Method: Surveyor, minerals:  (recliner to bed) Transfers/Ambulation Related to ADLs: Patient transfers at [employer] level with RW. Max vc's for technique/hand placement/safety/foot placement.    OT Diagnosis: Generalized weakness  OT Problem List: Decreased strength;Decreased activity tolerance;Impaired balance (sitting and/or standing);Decreased safety awareness;Decreased cognition;Decreased knowledge of use of DME or AE OT Treatment Interventions: Self-care/ADL training;Energy conservation;Therapeutic activities;DME and/or AE instruction;Balance  training;Patient/family education;Cognitive remediation/compensation   OT Goals Acute Rehab OT Goals OT Goal Formulation: With patient/family Time For Goal Achievement: 12/28/11 Potential to Achieve Goals: Fair ADL Goals Pt Will Perform Grooming: with supervision;Sitting, edge of bed ADL Goal: Grooming - Progress: Goal set today Pt Will Perform Upper Body Bathing: with supervision;Sitting, edge of bed ADL Goal: Upper Body Bathing - Progress: Goal set today Pt Will Perform Lower Body Bathing: with supervision;Sit to stand from bed ADL Goal: Lower Body Bathing - Progress: Goal set today Pt Will Perform Upper Body Dressing: with supervision;Sitting, bed ADL Goal: Upper Body Dressing - Progress: Goal set today Pt Will Perform Lower Body Dressing: with supervision;Sit to stand from bed ADL Goal: Lower Body Dressing - Progress: Goal set today Pt Will Transfer to Toilet: with supervision;3-in-1;Stand pivot transfer ADL Goal: Toilet Transfer - Progress: Goal set today Pt Will Perform Toileting - Clothing Manipulation: with supervision;Sitting on 3-in-1 or toilet ADL Goal: Toileting - Clothing Manipulation - Progress: Goal set today Pt Will Perform Toileting - Hygiene: with supervision;Sit to stand from 3-in-1/toilet;Leaning right and/or left on 3-in-1/toilet ADL Goal: Toileting - Hygiene - Progress: Goal set today  Visit Information  Last OT Received On: 12/14/11 Assistance Needed: +1    Subjective Data  Subjective: Non-verbal. Wife states that he is looking much better than yesterday. Patient Stated Goal: Wife states that she would like him tog et back to baseline to return home.   Prior Functioning     Home Living Lives With: Spouse Available Help at Discharge: Family;Available 24 hours/day Type of Home: House Home Access: Stairs to enter [employer] of Steps: 2 Entrance Stairs-Rails: None Home Layout: One level Bathroom Shower/Tub: Tub/shower  unit;Door;Curtain Firefighter: Standard Bathroom Accessibility: Yes How Accessible: Accessible via walker Home Adaptive Equipment: Shower chair with back;Walker - four wheeled;Bedside commode/3-in-1 Prior Function Level of Independence: Needs  assistance Needs Assistance: Bathing;Meal Prep;Light Housekeeping Bath: Supervision/set-up Meal Prep: Total Light Housekeeping: Total Driving: No Vocation: Retired Musician: Expressive difficulties Dominant Hand: Right            Cognition  Overall Cognitive Status: History of cognitive impairments - further impaired Area of Impairment: Following commands;Safety/judgement;Attention Arousal/Alertness: Awake/alert Orientation Level: Disoriented to;Place;Time;Situation Behavior During Session: Restless Following Commands: Follows one step commands inconsistently Safety/Judgement: Decreased safety judgement for tasks assessed Cognition - Other Comments: wife states that pt often loses orientation when out of home environment    Extremity/Trunk Assessment Right Upper Extremity Assessment RUE ROM/Strength/Tone: WFL for tasks assessed RUE Coordination: Deficits RUE Coordination Deficits: Impaired gross motor coordination Left Upper Extremity Assessment LUE ROM/Strength/Tone: WFL for tasks assessed LUE Coordination: Deficits LUE Coordination Deficits: impaired gross motor coordination      Mobility Bed Mobility Bed Mobility: Sit to Supine Sit to Supine: 2: Max assist;HOB flat Details for Bed Mobility Assistance: pt has trouble following directions Transfers Transfers: Sit to Stand;Stand to Sit Sit to Stand: 2: Max assist;From chair/3-in-1;From elevated surface Stand to Sit: 2: Max assist;To bed;With upper extremity assist           Balance Balance Balance Assessed: No   End of Session OT - End of Session Equipment Utilized During Treatment: Gait belt Activity Tolerance: Patient tolerated treatment  well Patient left: in bed;with call bell/phone within reach;with bed alarm set;with family/visitor present Nurse Communication: Other (comment) (leaking catheter)    Limmie Patricia, OTR/L 12/14/2011, 2:53 PM

## 2011-12-14 NOTE — Consult Note (Signed)
Note to be dictated. Will confirm choledocholithiasis with MRCP before ERCP considered.

## 2011-12-14 NOTE — Progress Notes (Signed)
Subjective: This man was admitted with altered mental status in a very dehydrated state. He has improved somewhat overnight with intravenous fluids and his sodium has also improved. He was also found to have abnormally elevated liver enzymes with abnormal ultrasound.           Physical Exam: Blood pressure 168/63, pulse 62, temperature 97.8 F (36.6 C), temperature source Oral, resp. rate 18, height 5\' 3"  (1.6 m), weight 52.164 kg (115 lb), SpO2 97.00%. He still looks somewhat clinically dehydrated. He is lethargic. Heart sounds are present and normal without murmurs. Lung fields appear to be clear. Abdomen is soft and nontender. There are no obvious masses although he is difficult to examine.   Investigations:  Recent Results (from the past 240 hour(s))  MRSA PCR SCREENING     Status: Normal   Collection Time   12/13/11 10:08 PM      Component Value Range Status Comment   MRSA by PCR NEGATIVE  NEGATIVE Final      Basic Metabolic Panel:  Basename 12/14/11 0809 12/14/11 0343  NA 146* 149*  K 3.3* 3.4*  CL 116* 119*  CO2 24 20  GLUCOSE 86 93  BUN 25* 28*  CREATININE 1.47* 1.45*  CALCIUM 8.8 8.9  MG -- --  PHOS -- --   Liver Function Tests:  [facility] 12/14/11 0343 12/13/11 1453  AST 57* 80*  ALT 133* 191*  ALKPHOS 119* 154*  BILITOT 0.9 0.9  PROT 6.5 7.9  ALBUMIN 3.0* 3.7     CBC:  Basename 12/14/11 0343 12/13/11 1453  WBC 9.9 8.2  NEUTROABS -- 6.1  HGB 11.5* 13.7  HCT 35.7* 42.7  MCV 99.2 100.5*  PLT 107* 147*    Dg Chest 1 View  12/13/2011  *RADIOLOGY REPORT*  Clinical Data: Altered mental status  CHEST - 1 VIEW  Comparison: 09/15/2011 and 12/13/2011  Findings: Cardiomediastinal silhouette is stable.  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  No acute infiltrate or pleural effusion.  No pulmonary edema. Atherosclerotic calcifications of thoracic aorta again noted.  IMPRESSION: No active disease.  No significant change.   Original Report  Authenticated By: Natasha Mead, M.D.    Ct Head Wo Contrast  12/13/2011  *RADIOLOGY REPORT*  Clinical Data: Dementia.  Weakness.  Altered mental status. Hypertension.  CT HEAD WITHOUT CONTRAST  Technique:  Contiguous axial images were obtained from the base of the skull through the vertex without contrast.  Comparison: 09/16/2011 MR.  09/15/2011 CT.  Findings: No intracranial hemorrhage.  Significant confluent white matter type changes periventricular and subcortical region.  This limits detection of small acute infarct. No CT evidence of large acute infarct.  Global atrophy without hydrocephalus.  No intracranial mass lesion detected on this unenhanced exam.  Vascular calcifications.  Polypoid opacification right maxillary sinus.  IMPRESSION: No intracranial hemorrhage.  Significant small vessel disease type changes limits detection of small acute infarct however, no CT evidence of large acute infarct.   Original Report Authenticated By: Lacy Duverney, M.D.    US Abdomen Limited Ruq  12/13/2011  *RADIOLOGY REPORT*  Clinical Data:  Elevated LFTs, question cholelithiasis  LIMITED ABDOMINAL ULTRASOUND - RIGHT UPPER QUADRANT  Comparison:  None  Findings:  Gallbladder:  Suboptimally distended.  Small amount of probable dependent sludge with mild wall thickening, which could be due to edema or to underdistension.  No sonographic Murphy's sign or definite pericholecystic fluid.  No definite discrete shadowing calculi identified.  Common bile duct:  Dilated 11 mm diameter.  Questionable intraluminal echogenic focus within CBD, question artifact, unable to exclude choledocolithiasis.  Liver:  Normal parenchymal echogenicity.  Intrahepatic biliary dilatation.  No focal mass lesion.  No right upper quadrant ascites identified. Visualized portion of pancreas unremarkable.  IMPRESSION: Intrahepatic and extrahepatic biliary dilatation with questionable intraluminal echogenic focus/calculus versus artifact. In the setting of  elevated LFTs, consider follow-up MRCP imaging. Incompletely distended gallbladder with a slightly thickened wall and likely dependent sludge. No definite gallstones or sonographic Murphy's sign identified.                    Original Report Authenticated By: Ulyses Southward, M.D.       Medications: I have reviewed the patient's current medications.  Impression: 1. Metabolic encephalopathy secondary to dehydration and hypernatremia. 2. Hypernatremia secondary to dehydration. 3. Abnormal elevation of liver enzymes with intrahepatic and extrahepatic biliary dilation. 4. Moderate to severe dementia. 5. Hypokalemia.     Plan: 1. Continue with intravenous fluid hydration, change IV fluids to half normal saline with potassium. 2. Gastroenterology consultation.     LOS: 1 day   Wilson Singer Pager [phone]  12/14/2011, 10:56 AM

## 2011-12-14 NOTE — Progress Notes (Signed)
Visited patient and wife at bedside to provide her with contact information and support.  Patient continues to have marked lethargy.  PT eval was in progress.  Made inpatient care manager aware of [facility] Liaison availability.  If patient does not improve short term SNF for rehab is recommended by our community based team.  Will continue to monitor.   For additional questions or referrals please contact Anibal Henderson BSN RN [facility] Liaison at [phone].

## 2011-12-14 NOTE — Evaluation (Signed)
Physical Therapy Evaluation Patient Details Name: [name] MRN: [id number] DOB: [birth year] Today's Date: 12/14/2011 Time: 7829-5621 PT Time Calculation (min): 50 min  PT Assessment / Plan / Recommendation Clinical Impression  Pt is now awake enough for eval.  Wife is present and is able to give hx.  Pt is normally able to ambulate with a walker and is independent with gait in the home.  He is verbal and feeds himself independently.  Today, he is more confused and non-verbal.  He is inconsistent with following directions and needed max assist to transfer OOB and walk 3 steps to chair.  He appears to have difficulty swallowing and may need a swallow eval.  I will speak with RN about this.  If he does not regain prior function, he will need SNF...wife is agreeable.    PT Assessment  Patient needs continued PT services    Follow Up Recommendations  SNF    Does the patient have the potential to tolerate intense rehabilitation      Barriers to Discharge None      Equipment Recommendations  None recommended by PT    Recommendations for Other Services OT consult   Frequency Min 3X/week    Precautions / Restrictions Precautions Precautions: Fall Restrictions Weight Bearing Restrictions: No   Pertinent Vitals/Pain       Mobility  Bed Mobility Bed Mobility: Supine to Sit Supine to Sit: 2: Max assist;HOB elevated Details for Bed Mobility Assistance: pt has trouble following directions Transfers Transfers: Sit to Stand;Stand to Sit Sit to Stand: 2: Max assist;From bed;With upper extremity assist Stand to Sit: 2: Max assist;With upper extremity assist;To chair/3-in-1 Ambulation/Gait Ambulation/Gait Assistance: 2: Max assist Ambulation Distance (Feet): 3 Feet Assistive device: Rolling walker Gait Pattern: Trunk flexed;Shuffle;Narrow base of support;Step-through pattern;Decreased step length - right;Decreased step length - left General Gait Details: very unstable gait  pattern...per wife, pt normally is able to walk independently with a walker or furniture walks Stairs: No Wheelchair Mobility Wheelchair Mobility: No    Shoulder Instructions     Exercises     PT Diagnosis: Difficulty walking;Generalized weakness  PT Problem List: Decreased strength;Decreased activity tolerance;Decreased mobility;Decreased cognition;Decreased knowledge of use of DME;Decreased safety awareness PT Treatment Interventions: Gait training;Functional mobility training;Therapeutic activities;Therapeutic exercise;Patient/family education   PT Goals Acute Rehab PT Goals PT Goal Formulation: With family Time For Goal Achievement: 12/28/11 Potential to Achieve Goals: Fair Pt will go Supine/Side to Sit: with mod assist;with HOB not 0 degrees (comment degree) PT Goal: Supine/Side to Sit - Progress: Goal set today Pt will go Sit to Supine/Side: with mod assist;with HOB not 0 degrees (comment degree) PT Goal: Sit to Supine/Side - Progress: Goal set today Pt will go Sit to Stand: with mod assist;with upper extremity assist PT Goal: Sit to Stand - Progress: Goal set today Pt will Ambulate: 1 - 15 feet;with mod assist;with rolling walker PT Goal: Ambulate - Progress: Goal set today  Visit Information  Last PT Received On: 12/14/11 Reason Eval/Treat Not Completed: Fatigue/lethargy limiting ability to participate    Subjective Data  Subjective: pt is non verbal today...wife states that he doesn't look as good as he did yesterday Patient Stated Goal: wife needs for him to get close to his baseline so that she can manage him at home   Prior Functioning  Home Living Lives With: Spouse Available Help at Discharge: Family;Available 24 hours/day Type of Home: House Home Access: Stairs to enter [employer] of Steps: 2 Entrance Stairs-Rails: None  Home Layout: One level Bathroom Shower/Tub: Engineer, manufacturing systems: Standard Bathroom Accessibility: Yes How  Accessible: Accessible via walker Home Adaptive Equipment: Shower chair without back;Walker - four wheeled;Bedside commode/3-in-1 Prior Function Level of Independence: Needs assistance Needs Assistance: Bathing;Meal Prep;Light Housekeeping Vocation: Retired Musician: Expressive difficulties    Cognition  Overall Cognitive Status: History of cognitive impairments - further impaired Area of Impairment: Following commands Arousal/Alertness: Lethargic Orientation Level:  (unable to assess) Behavior During Session: Lethargic Following Commands: Follows one step commands inconsistently Cognition - Other Comments: wife states that pt often loses orientation when out of home environment    Extremity/Trunk Assessment Right Lower Extremity Assessment RLE ROM/Strength/Tone: Unable to fully assess;Due to impaired cognition (able to move LE but seems to have increased ext tone) Left Lower Extremity Assessment LLE ROM/Strength/Tone: Unable to fully assess;Due to impaired cognition (see note for RLE)   Balance Balance Balance Assessed: No  End of Session PT - End of Session Equipment Utilized During Treatment: Gait belt Activity Tolerance: Patient tolerated treatment well Patient left: in chair;with call bell/phone within reach;with family/visitor present (wife to stay in room with pt) Nurse Communication: Mobility status  GP     Konrad Penta 12/14/2011, 12:54 PM

## 2011-12-14 NOTE — Progress Notes (Signed)
PT Cancellation Note  Patient Details Name: [name] MRN: [id number] DOB: 1929/01/31   Cancelled Treatment:    Reason Eval/Treat Not Completed: Fatigue/lethargy limiting ability to participate   Shelbie Franken 12/14/2011, 9:02 AM

## 2011-12-14 NOTE — Progress Notes (Signed)
Patient returned from MRI with staff stating he did not tolerate procedure well.  He was combative and thrashing around and staff stated they were unable to get results.  Staff stated that test could be repeated in am to see if patient tolerated better at that time.  Notified Dr. Karilyn Cota, he stated they would try again in am.  [place], Candelaria Stagers 12/14/2011

## 2011-12-14 NOTE — Progress Notes (Signed)
Utilization Review Complete  

## 2011-12-15 ENCOUNTER — Inpatient Hospital Stay (HOSPITAL_COMMUNITY): Payer: Medicare Other

## 2011-12-15 DIAGNOSIS — G9340 Encephalopathy, unspecified: Secondary | ICD-10-CM

## 2011-12-15 LAB — CBC
MCH: 32.1 pg (ref 26.0–34.0)
MCHC: 33 g/dL (ref 30.0–36.0)
Platelets: 86 10*3/uL — ABNORMAL LOW (ref 150–400)
RBC: 3.36 MIL/uL — ABNORMAL LOW (ref 4.22–5.81)
RDW: 11.7 % (ref 11.5–15.5)

## 2011-12-15 LAB — COMPREHENSIVE METABOLIC PANEL
ALT: 89 U/L — ABNORMAL HIGH (ref 0–53)
AST: 40 U/L — ABNORMAL HIGH (ref 0–37)
Albumin: 2.6 g/dL — ABNORMAL LOW (ref 3.5–5.2)
Calcium: 8 mg/dL — ABNORMAL LOW (ref 8.4–10.5)
Sodium: 140 mEq/L (ref 135–145)
Total Protein: 5.8 g/dL — ABNORMAL LOW (ref 6.0–8.3)

## 2011-12-15 LAB — URINE CULTURE

## 2011-12-15 MED ORDER — ACETAMINOPHEN 650 MG RE SUPP
650.0000 mg | RECTAL | Status: DC | PRN
  Administered 2011-12-15 – 2011-12-18 (×3): 650 mg via RECTAL
  Filled 2011-12-15 (×3): qty 1

## 2011-12-15 MED ORDER — SODIUM CHLORIDE 0.9 % IV SOLN: INTRAVENOUS | Status: DC

## 2011-12-15 NOTE — Consult Note (Signed)
[name], [name]:  [id number]  MEDICAL RECORD NO.:  [id number]  LOCATION:  A323                          FACILITY:  [facility]  PHYSICIAN:  Lionel December, M.D.    DATE OF BIRTH:  [birth year]  DATE OF CONSULTATION:  12/14/2011 DATE OF DISCHARGE:                                CONSULTATION   REASON FOR CONSULTATION:  Elevated transaminases and dilated bile duct.  HISTORY OF PRESENT ILLNESS:  History is obtained from the patient's wife who is at bedside in his room. The patient is an 78 year old Afro-American male, who has dementia, but stays at home with his wife.  He has been in usual state of health until 3 days ago when on Sunday when he had a syncopal episode.  The patient was unresponsive for 3-5 minutes.  He regained consciousness, but he has not been back to his baseline.  He apparently had a similar spell in September 2013 when he was admitted to [facility] and had extensive evaluation and it was felt that he may have had a mini CVA or metabolic encephalopathy.  At any rate, he improved and was able to go home.  Since that episode past week end, he has not been doing well.  He was evaluated by home health care nurse and noted to be tachycardic.  He was also not talking like he had been before.  She therefore advised that he be brought to the emergency room for evaluation.  He was evaluated and hospitalized.  He was felt to have metabolic encephalopathy since he had hypernatremia, and he was felt to be dehydrated.  He also had elevated transaminases.  Unenhanced head CT reveals no evidence of  acute infarct.  He had vascular calcification small vessel disease and opacification in the right maxillary sinus. Chest x-ray was negative for pneumonia.  He also had ultrasound results, which are reviewed under lab data.  According to his wife, there is no history of nausea, vomiting, or fever.  Two days prior to admission, she noted his lids to be  chapped, and she thought he might be febrile, but she did not check his temperature.  Until this episode 3 days ago, his appetite has been very good.  Since then, he has been eating poorly.  He may have lost a couple pounds.  There is no history of abdominal pain.  His bowels move every other day or every 3rd day.  Home meds include aspirin 162 mg p.o. daily, clonazepam 0.5 mg 3 times a day.  He does not take this medicine regularly.  Colace 100 mg p.o. t.i.d., Megestrol 4 mg p.o. daily, metoprolol 50 mg p.o. daily, omeprazole 20 mg p.o. q.a.m., ramipril 10 mg p.o. daily, simvastatin 40 mg p.o. daily, tamsulosin 0.4 mg p.o. daily.  Current medications include pantoprazole 40 mg p.o. daily, Colace 100 mg p.o. t.i.d.  P.r.n. medications include acetaminophen, haloperidol, ondansetron, polyethylene glycol, and trazodone.  PAST MEDICAL HISTORY: 1. He suffered CVA in 2005.  He has developed right-sided weakness     since his CVA.  He has been able to ambulate with a walker. 2. Coronary artery disease with MI in September 2006.  He had 2 drug-     eluting stents placed.  His wife states that he coded twice and had     to be resuscitated.  He has hyperlipidemia. 3. Chronic GERD. 4. Hypertension. 5. He was diagnosed with dementia in 2011. 6. History of anemia and thrombocytopenia.  Chart also mentions     peripheral neuropathy and cerebellar artery occlusion and/or     stenosis. 7. He had screening colonoscopy about 10 years ago, which was normal. 8. BPH.  ALLERGIES:  NK.  FAMILY HISTORY:  Father died of lung carcinoma in his 54s. Mother died of MI in her 56s.  One brother died of MI in his 30s, and he has a brother age 79 who is doing fine.  SOCIAL HISTORY:  He is married.  One grown up child.  He retired from [employer] where he worked for over 30 years.  He was very active until he had a CVA in 2005.  He is a nonsmoker and does not drink alcohol.  PHYSICAL EXAMINATION:   VITAL SIGNS:  Admission weight 220 pounds, he is 63 inches tall.  Pulse 84 per minute, regular blood pressure 138/84, respirations 16, and temp is 99.3. HEENT:  Conjunctivae is pink.  Sclera is nonicteric.  Oropharyngeal mucosa is not remarkable. NECK:  No neck masses or thyromegaly noted. CARDIAC:  Regular rhythm.  Normal S1, S2.  No murmur or gallop noted. LUNGS:  Clear to auscultation. ABDOMEN:  Flat.  Bowel sounds are normal.  It is soft and nontender without organomegaly or masses.  No peripheral edema or clubbing noted. He has mittens over his hands. NEUROLOGIC:  The patient is alert and he responds appropriate to simple questions.  LABORATORY DATA:  On admission, WBC 8.2, H and H 13.7 and 42.7, platelet count 147 K. Serum sodium 152, potassium 3.9, chloride 118, CO2 20, glucose 135, BUN 33, creatinine 1.76.  Calcium 10.0, bilirubin 0.9, AP 154, AST 80, ALT 191, and albumin 3.7.  Total protein was 7.9.  INR 1.13, PTT 29.  Metabolic 7 from this morning, serum sodium 150, potassium 4, chloride 122, CO2 18, glucose 102, BUN 31, creatinine 1.50. Lipase on admission was 25.  Ultrasound from admission reveals normal- appearing liver parenchyma, dilated bile duct and intrahepatic biliary radicals.  CBD measured 11 mm.  There is an echogenic focus within the gallbladder raising suspicion for choledocholithiasis.  Gallbladder was not fully distended with slightly prominent wall and sludge, but no stones.  I also reviewed abdominopelvic CT from September 03, 2010, revealing normal- sized bile duct and unremarkable liver.  No stones were identified in the gallbladder.  The study did show enlarged prostate.  ASSESSMENT:  The patient is an 78 year old Caucasian male who has fairly advanced dementia, who presents with mental status changes initially noted 2 days prior to admission.  On admission, he was noted to be hypernatremic with elevated BUN and creatinine, and elevated transaminases.   Unenhanced head CT was negative for acute abnormalities.  Ultrasound revealed dilated biliary system, which is new at least since CT of August.  2012.  There is also question of echogenic focus or foci and bile duct suspicious for choledocholithiasis.  I have reviewed the imaging studies, and it is questionable.  It is quite possible that the patient's symptom complex is secondary to cholangitis.  Patients in this age group can present with atypical symptoms. The patients can present with mental status changes.  Given that he is at high risk for  any intervention, choledocholithiasis needs to be unequivocally documented before ERCP considered.  RECOMMENDATIONS: 1. We will request MRCP today. 2. We will hold off Lovenox, but continue aspirin as he may need ERCP     with sphincterotomy. 3. We will stop simvastatin since his transaminases are elevated.     This medication can be resumed at a later date. 4. Start him on ceftriaxone 1 g IV q.24 hours. 5. Further recommendations will be made after MRCP completed and     reviewed. 6. We appreciate the opportunity to participate in the care this     gentleman.          ______________________________ Lionel December, M.D.     NR/MEDQ  D:  12/14/2011  T:  12/15/2011  Job:  [id number]

## 2011-12-15 NOTE — Progress Notes (Signed)
Patient is alert and has eye contact. He is coughing intermittently appears to be aspirating his secretions. Lab studies reviewed. Ultrasound and MRCP films reviewed with Dr. Tyron Russell. MRCP suggest stricture involving common hepatic duct with dilated biliary system upstream. Patient will need ERCP for diagnostic and therapeutic purposes. I went over imaging studies with patient's wife and reviewed options. She would like to proceed with ERCP. Patient is high risk given history of coronary disease and CVA. Will schedule patient for ERCP in a.m. If he has stricture may consider Spyglass examination as well.

## 2011-12-15 NOTE — Progress Notes (Signed)
OT Cancellation Note  Patient Details Name: [name] MRN: [id number] DOB: 15-Aug-1929   Cancelled Treatment:    Reason Eval/Treat Not Completed: Fatigue/lethargy limiting ability to participate. Patient not agreeable to participate in tx session this PM. Will attempt tomorrow if appropriate.  Limmie Patricia, OTR/L 12/15/2011, 3:11 PM

## 2011-12-15 NOTE — Progress Notes (Signed)
Subjective: This man was admitted with altered mental status in a very dehydrated state. He has improved significantly compared to yesterday. He is more alert today. He had MRCP and this is suggestive of a intrahepatic biliary stricture, consistent with cholangiocarcinoma. This morning he is somewhat congested in his chest.           Physical Exam: Blood pressure 155/93, pulse 71, temperature 99.4 F (37.4 C), temperature source Oral, resp. rate 16, height 5\' 3"  (1.6 m), weight 54.931 kg (121 lb 1.6 oz), SpO2 98.00%. He appears much better hydrated clinically. He is lethargic. Heart sounds are present and normal without murmurs. Lung fields show coarse scattered crackles. Abdomen is soft and nontender. There are no obvious masses although he is difficult to examine.   Investigations:  Recent Results (from the past 240 hour(s))  MRSA PCR SCREENING     Status: Normal   Collection Time   12/13/11 10:08 PM      Component Value Range Status Comment   MRSA by PCR NEGATIVE  NEGATIVE Final      Basic Metabolic Panel:  Basename 12/15/11 0548 12/14/11 0809  NA 140 146*  K 3.9 3.3*  CL 113* 116*  CO2 19 24  GLUCOSE 83 86  BUN 16 25*  CREATININE 1.19 1.47*  CALCIUM 8.0* 8.8  MG -- --  PHOS -- --   Liver Function Tests:  [facility] 12/15/11 0548 12/14/11 0343  AST 40* 57*  ALT 89* 133*  ALKPHOS 100 119*  BILITOT 0.8 0.9  PROT 5.8* 6.5  ALBUMIN 2.6* 3.0*     CBC:  Basename 12/15/11 0548 12/14/11 0343 12/13/11 1453  WBC 7.4 9.9 --  NEUTROABS -- -- 6.1  HGB 10.8* 11.5* --  HCT 32.7* 35.7* --  MCV 97.3 99.2 --  PLT 86* 107* --    Dg Chest 1 View  12/13/2011  *RADIOLOGY REPORT*  Clinical Data: Altered mental status  CHEST - 1 VIEW  Comparison: 09/15/2011 and 12/13/2011  Findings: Cardiomediastinal silhouette is stable.  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  No acute infiltrate or pleural effusion.  No pulmonary edema. Atherosclerotic calcifications of thoracic  aorta again noted.  IMPRESSION: No active disease.  No significant change.   Original Report Authenticated By: Natasha Mead, M.D.    Ct Head Wo Contrast  12/13/2011  *RADIOLOGY REPORT*  Clinical Data: Dementia.  Weakness.  Altered mental status. Hypertension.  CT HEAD WITHOUT CONTRAST  Technique:  Contiguous axial images were obtained from the base of the skull through the vertex without contrast.  Comparison: 09/16/2011 MR.  09/15/2011 CT.  Findings: No intracranial hemorrhage.  Significant confluent white matter type changes periventricular and subcortical region.  This limits detection of small acute infarct. No CT evidence of large acute infarct.  Global atrophy without hydrocephalus.  No intracranial mass lesion detected on this unenhanced exam.  Vascular calcifications.  Polypoid opacification right maxillary sinus.  IMPRESSION: No intracranial hemorrhage.  Significant small vessel disease type changes limits detection of small acute infarct however, no CT evidence of large acute infarct.   Original Report Authenticated By: Lacy Duverney, M.D.    Mr Mrcp  12/15/2011  *RADIOLOGY REPORT*  Clinical Data:  Elevated liver function studies with nausea. Dementia.  MRI ABDOMEN WITHOUT CONTRAST (MRCP)  Technique: Multiplanar multisequence MR imaging of the abdomen was performed, including heavily T2-weighted images of the biliary and pancreatic ducts.  Three-dimensional MR images were rendered by post processing of the original MR data.  Comparison:  Abdominal CT 09/03/2010 and 03/23/2004.  Right upper quadrant abdominal ultrasound 12/13/2011.  Findings:  Study is mildly limited by breathing artifact due to the patient's dementia.  Intravenous contrast was not administered.  There is moderate diffuse intrahepatic biliary dilatation.  There is an abrupt change in the caliber of the ductal system within the hepatic hilum with a focal high-grade stricture measuring 9 mm in length.  The common bile duct within and  immediately superior to the pancreatic head is normal in caliber.  There is no definite evidence of choledocholithiasis.  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 The gallbladder appears normal.  The pancreas and pancreatic duct appear normal.  There is no evidence of pancreas divisum.  No focal hepatic abnormalities are seen.  The spleen and adrenal glands appear normal.  Bilateral renal cysts are unchanged.  There is possible diffuse gastric wall thickening versus incomplete distension.  No enlarged lymph nodes are seen within the upper abdomen.  IMPRESSION:  1.  Moderate diffuse intrahepatic biliary dilatation with high- grade long segment stricture near the hepatic hilum.  No definite evidence of choledocholithiasis.  Findings are concerning for a malignant stricture (probably a cholangiocarcinoma).  Detail is limited by breathing artifact and the lack of intravenous contrast. 2.  The pancreas appears normal.  There is no evidence of metastatic disease. 3.  Possible diffuse gastric wall thickening versus incomplete distension.  The patient may be able to better tolerate abdominal CT with contrast if further imaging is desired prior to ERCP.  I have left a telephone message to discuss these findings with Dr. Karilyn Cota.   Original Report Authenticated By: Carey Bullocks, M.D.    Mr 3d Recon At Scanner  12/15/2011  *RADIOLOGY REPORT*  Clinical Data:  Elevated liver function studies with nausea. Dementia.  MRI ABDOMEN WITHOUT CONTRAST (MRCP)  Technique: Multiplanar multisequence MR imaging of the abdomen was performed, including heavily T2-weighted images of the biliary and pancreatic ducts.  Three-dimensional MR images were rendered by post processing of the original MR data.  Comparison:  Abdominal CT 09/03/2010 and 03/23/2004.  Right upper quadrant abdominal ultrasound 12/13/2011.  Findings:  Study is mildly limited by breathing artifact due to the  patient's dementia.  Intravenous contrast was not administered.  There is moderate diffuse intrahepatic biliary dilatation.  There is an abrupt change in the caliber of the ductal system within the hepatic hilum with a focal high-grade stricture measuring 9 mm in length.  The common bile duct within and immediately superior to the pancreatic head is normal in caliber.  There is no definite evidence of choledocholithiasis.  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 The gallbladder appears normal.  The pancreas and pancreatic duct appear normal.  There is no evidence of pancreas divisum.  No focal hepatic abnormalities are seen.  The spleen and adrenal glands appear normal.  Bilateral renal cysts are unchanged.  There is possible diffuse gastric wall thickening versus incomplete distension.  No enlarged lymph nodes are seen within the upper abdomen.  IMPRESSION:  1.  Moderate diffuse intrahepatic biliary dilatation with high- grade long segment stricture near the hepatic hilum.  No definite evidence of choledocholithiasis.  Findings are concerning for a malignant stricture (probably a cholangiocarcinoma).  Detail is limited by breathing artifact and the lack of intravenous contrast. 2.  The pancreas appears normal.  There is no evidence of metastatic disease. 3.  Possible diffuse gastric wall thickening versus incomplete distension.  The patient may be able to better tolerate abdominal CT with contrast if further imaging is desired prior to ERCP.  I have left a telephone message to discuss these findings with Dr. Karilyn Cota.   Original Report Authenticated By: Carey Bullocks, M.D.    US Abdomen Limited Ruq  12/13/2011  *RADIOLOGY REPORT*  Clinical Data:  Elevated LFTs, question cholelithiasis  LIMITED ABDOMINAL ULTRASOUND - RIGHT UPPER QUADRANT  Comparison:  None  Findings:  Gallbladder:  Suboptimally distended.  Small amount of probable dependent sludge with  mild wall thickening, which could be due to edema or to underdistension.  No sonographic Murphy's sign or definite pericholecystic fluid.  No definite discrete shadowing calculi identified.  Common bile duct:  Dilated 11 mm diameter.  Questionable intraluminal echogenic focus within CBD, question artifact, unable to exclude choledocolithiasis.  Liver:  Normal parenchymal echogenicity.  Intrahepatic biliary dilatation.  No focal mass lesion.  No right upper quadrant ascites identified. Visualized portion of pancreas unremarkable.  IMPRESSION: Intrahepatic and extrahepatic biliary dilatation with questionable intraluminal echogenic focus/calculus versus artifact. In the setting of elevated LFTs, consider follow-up MRCP imaging. Incompletely distended gallbladder with a slightly thickened wall and likely dependent sludge. No definite gallstones or sonographic Murphy's sign identified.                    Original Report Authenticated By: Ulyses Southward, M.D.       Medications: I have reviewed the patient's current medications.  Impression: 1. Metabolic encephalopathy secondary to dehydration and hypernatremia, improving. 2. Hypernatremia secondary to dehydration, resolved. 3. Abnormal elevation of liver enzymes with intrahepatic diffuse biliary dilatation with high grade long segment stricture near the hepatic hilum, findings concerning for malignant stricture. 4. Moderate to severe dementia. 5. Hypokalemia, resolved.     Plan: 1. Reduce IV fluids as he is much better hydrated. 2. Further recommendations from gastroenterology, likely needs ERCP. 3. Repeat chest x-ray as he is somewhat congested. 4. DO NOT RESUSCITATE,-I have discussed this with patient's wife.     LOS: 2 days   Wilson Singer Pager [phone]  12/15/2011, 9:52 AM

## 2011-12-15 NOTE — Clinical Social Work Psychosocial (Signed)
Clinical Social Work Department BRIEF PSYCHOSOCIAL ASSESSMENT 12/15/2011  Patient:  [name], [name]     Account Number:  [id number]     Admit date:  12/13/2011  Clinical Social Worker:  Nancie Neas  Date/Time:  12/15/2011 09:05 AM  Referred by:  Care Management  Date Referred:  12/15/2011 Referred for  SNF Placement   Other Referral:   Interview type:  Family Other interview type:    PSYCHOSOCIAL DATA Living Status:  WIFE Admitted from facility:   Level of care:   Primary support name:  Diamond Nickel Primary support relationship to patient:  SPOUSE Degree of support available:   very supportive    CURRENT CONCERNS Current Concerns  Post-Acute Placement   Other Concerns:    SOCIAL WORK ASSESSMENT / PLAN CSW met with pt's wife at bedside. Pt has dementia and is not oriented. Pt lives with wife and has other family that live very close if additional help is needed. Pt's wife appears to be very involved and supportive. She reports pt generally is managed well at home. He uses a walker to ambulate and requires assistance with bathing. Pt was diagnosed with dementia about two years ago by wife's report. He had been receiving home health as well as [facility] services prior to admission. Pt is much weaker currently and PT is recommending SNF. CSW discussed placement process and she is agreeable to initiate bed search, preferably in [place]. She is aware of copays. SNF list provided.   Assessment/plan status:  Psychosocial Support/Ongoing Assessment of Needs Other assessment/ plan:   Information/referral to community resources:   SNF list    PATIENT'S/FAMILY'S RESPONSE TO PLAN OF CARE: Pt unable to discuss plan of care due to dementia. Pt's wife is agreeable to [facility] SN[facility] for rehab prior to returning home. CSW to fax out FL2 and follow up with bed offers when available.        Derenda Fennel, [place] [phone]

## 2011-12-15 NOTE — Clinical Social Work Placement (Signed)
Clinical Social Work Department CLINICAL SOCIAL WORK PLACEMENT NOTE 12/15/2011  Patient:  [name], [name]  Account Number:  [id number] Admit date:  12/13/2011  Clinical Social Worker:  Derenda Fennel, LCSW  Date/time:  12/15/2011 09:25 AM  Clinical Social Work is seeking post-discharge placement for this patient at the following level of care:   SKILLED NURSING   (*CSW will update this form in Epic as items are completed)   12/15/2011  Patient/family provided with [facility] Department of Clinical Social Work's list of facilities offering this level of care within the geographic area requested by the patient (or if unable, by the patient's family).  12/15/2011  Patient/family informed of their freedom to choose among providers that offer the needed level of care, that participate in Medicare, Medicaid or managed care program needed by the patient, have an available bed and are willing to accept the patient.  12/15/2011  Patient/family informed of [facility]' ownership interest in [facility], as well as of the fact that they are under no obligation to receive care at this facility.  PASARR submitted to EDS on 12/15/2011 PASARR number received from EDS on 12/15/2011  FL2 transmitted to all facilities in geographic area requested by pt/family on  12/15/2011 FL2 transmitted to all facilities within larger geographic area on   Patient informed that his/her managed care company has contracts with or will negotiate with  certain facilities, including the following:     Patient/family informed of bed offers received:   Patient chooses bed at  Physician recommends and patient chooses bed at    Patient to be transferred to  on   Patient to be transferred to facility by   The following physician request were entered in Epic:   Additional Comments: existing pasarr  Derenda Fennel, LCSW [phone]

## 2011-12-15 NOTE — Progress Notes (Signed)
Physical Therapy Treatment Patient Details Name: [name] MRN: [id number] DOB: [birth year] Today's Date: 12/15/2011 Time: 4403-4742 PT Time Calculation (min): 32 min  PT Assessment / Plan / Recommendation Comments on Treatment Session  There has been a decline in functional status today.  Pt is awake but not reponding to questions in any way today.  He was unable to follow any directions.  He keeeps head turned L (unchanged from yesterday) and sitting balance is now poor...he needs mod -max assist to maintain sitting and falls L.  He was unable to get to standing at all.  Facilitation of bed mobility was also unsuccessful.  I am not sure if this decline is simply a mianifestation of fatigue or if there has been another cause.  We will continue to follow.    Follow Up Recommendations        Does the patient have the potential to tolerate intense rehabilitation     Barriers to Discharge        Equipment Recommendations       Recommendations for Other Services    Frequency     Plan Discharge plan remains appropriate;Frequency remains appropriate    Precautions / Restrictions     Pertinent Vitals/Pain     Mobility  Bed Mobility Bed Mobility: Rolling Right;Rolling Left Rolling Right: 1: +1 Total assist Rolling Left: 1: +1 Total assist Supine to Sit: 1: +1 Total assist Sit to Supine: 2: Max assist;HOB flat Details for Bed Mobility Assistance: pt unable to follow any directions Transfers Sit to Stand: 1: +1 Total assist;From bed Stand to Sit: 1: +1 Total assist;To bed Details for Transfer Assistance: unable to achieve stance...only able to get 50% of the way up with no participation of pt Ambulation/Gait Ambulation/Gait Assistance: Not tested (comment)    Exercises     PT Diagnosis:    PT Problem List:   PT Treatment Interventions:     PT Goals Acute Rehab PT Goals PT Goal: Supine/Side to Sit - Progress: Not progressing PT Goal: Sit to Supine/Side - Progress: Not  progressing PT Goal: Sit to Stand - Progress: Not progressing PT Goal: Ambulate - Progress: Not progressing  Visit Information  Last PT Received On: 12/15/11    Subjective Data  Subjective: pt still non verbal and now is not responding to questions in any way (he would nod his head at times yesterday)   Cognition       Balance  Balance Balance Assessed: Yes Static Sitting Balance Static Sitting - Balance Support: Bilateral upper extremity supported;Feet supported Static Sitting - Level of Assistance: 3: Mod assist;Other (comment) (pt falls L with head fully flexed)  End of Session PT - End of Session Equipment Utilized During Treatment: Gait belt Activity Tolerance: Other (comment) (limited by decreased cognition, ?fatigue) Patient left: in bed;with call bell/phone within reach;with bed alarm set   GP     Myrlene Broker L 12/15/2011, 1:34 PM

## 2011-12-16 ENCOUNTER — Encounter (HOSPITAL_COMMUNITY): Payer: Self-pay | Admitting: *Deleted

## 2011-12-16 ENCOUNTER — Inpatient Hospital Stay (HOSPITAL_COMMUNITY): Payer: Medicare Other

## 2011-12-16 ENCOUNTER — Encounter (HOSPITAL_COMMUNITY): Payer: Self-pay | Admitting: Anesthesiology

## 2011-12-16 ENCOUNTER — Encounter (HOSPITAL_COMMUNITY)
Admission: EM | Disposition: A | Discharge status: Skilled Nursing Facility | Payer: Self-pay | Source: Home / Self Care | Attending: Internal Medicine

## 2011-12-16 ENCOUNTER — Inpatient Hospital Stay (HOSPITAL_COMMUNITY): Payer: Medicare Other | Admitting: Anesthesiology

## 2011-12-16 DIAGNOSIS — R1310 Dysphagia, unspecified: Secondary | ICD-10-CM

## 2011-12-16 HISTORY — PX: SPYGLASS CHOLANGIOSCOPY: SHX5441

## 2011-12-16 HISTORY — PX: ERCP: SHX5425

## 2011-12-16 HISTORY — PX: BILIARY STENT PLACEMENT: SHX5538

## 2011-12-16 LAB — COMPREHENSIVE METABOLIC PANEL
Albumin: 2.9 g/dL — ABNORMAL LOW (ref 3.5–5.2)
Alkaline Phosphatase: 109 U/L (ref 39–117)
BUN: 20 mg/dL (ref 6–23)
Calcium: 8.9 mg/dL (ref 8.4–10.5)
GFR calc Af Amer: 59 mL/min — ABNORMAL LOW (ref >90)
Glucose, Bld: 71 mg/dL (ref 70–99)
Potassium: 4.2 mEq/L (ref 3.5–5.1)
Total Protein: 6.6 g/dL (ref 6.0–8.3)

## 2011-12-16 LAB — CBC
HCT: 35.5 % — ABNORMAL LOW (ref 39.0–52.0)
MCH: 32.2 pg (ref 26.0–34.0)
MCHC: 33.2 g/dL (ref 30.0–36.0)
RDW: 11.7 % (ref 11.5–15.5)

## 2011-12-16 SURGERY — ERCP, WITH INTERVENTION IF INDICATED
Anesthesia: General

## 2011-12-16 MED ORDER — LACTATED RINGERS IV SOLN
INTRAVENOUS | Status: DC
  Administered 2011-12-16 (×3): via INTRAVENOUS

## 2011-12-16 MED ORDER — GLUCAGON HCL (RDNA) 1 MG IJ SOLR
INTRAMUSCULAR | Status: DC | PRN
  Administered 2011-12-16 (×4): 0.25 mg via INTRAVENOUS

## 2011-12-16 MED ORDER — SODIUM CHLORIDE 0.9 % IV SOLN
INTRAVENOUS | Status: DC | PRN
  Administered 2011-12-16: 11:00:00

## 2011-12-16 MED ORDER — PHENYLEPHRINE HCL 10 MG/ML IJ SOLN
INTRAMUSCULAR | Status: DC | PRN
  Administered 2011-12-16 (×4): 100 ug via INTRAVENOUS

## 2011-12-16 MED ORDER — ACETYLCYSTEINE 20 % IN SOLN
RESPIRATORY_TRACT | Status: DC | PRN
  Administered 2011-12-16: 4 mL via ORAL

## 2011-12-16 MED ORDER — GLYCOPYRROLATE 0.2 MG/ML IJ SOLN
INTRAMUSCULAR | Status: AC
  Filled 2011-12-16: qty 1

## 2011-12-16 MED ORDER — STERILE WATER FOR IRRIGATION IR SOLN
Status: DC | PRN
  Administered 2011-12-16: 11:00:00

## 2011-12-16 MED ORDER — PROPOFOL 10 MG/ML IV EMUL
INTRAVENOUS | Status: DC | PRN
  Administered 2011-12-16: 100 mg via INTRAVENOUS

## 2011-12-16 MED ORDER — ARTIFICIAL TEARS OP OINT
TOPICAL_OINTMENT | OPHTHALMIC | Status: AC
  Filled 2011-12-16: qty 3.5

## 2011-12-16 MED ORDER — METOPROLOL TARTRATE 1 MG/ML IV SOLN
INTRAVENOUS | Status: DC | PRN
  Administered 2011-12-16: 1 mg via INTRAVENOUS

## 2011-12-16 MED ORDER — ALPRAZOLAM 1 MG PO TABS: 1.0000 mg | ORAL_TABLET | Freq: Three times a day (TID) | ORAL | Status: DC | PRN

## 2011-12-16 MED ORDER — SUCCINYLCHOLINE CHLORIDE 20 MG/ML IJ SOLN
INTRAMUSCULAR | Status: DC | PRN
  Administered 2011-12-16: 100 mg via INTRAVENOUS

## 2011-12-16 MED ORDER — METOPROLOL TARTRATE 1 MG/ML IV SOLN
INTRAVENOUS | Status: AC
  Filled 2011-12-16: qty 5

## 2011-12-16 MED ORDER — MIDAZOLAM HCL 2 MG/2ML IJ SOLN
INTRAMUSCULAR | Status: AC
  Filled 2011-12-16: qty 2

## 2011-12-16 MED ORDER — FENTANYL CITRATE 0.05 MG/ML IJ SOLN: 25.0000 ug | INTRAMUSCULAR | Status: DC | PRN

## 2011-12-16 MED ORDER — PHENYLEPHRINE HCL 10 MG/ML IJ SOLN
INTRAMUSCULAR | Status: AC
  Filled 2011-12-16: qty 1

## 2011-12-16 MED ORDER — RAMIPRIL 10 MG PO CAPS: 10.0000 mg | ORAL_CAPSULE | Freq: Every day | ORAL | Status: DC

## 2011-12-16 MED ORDER — FENTANYL CITRATE 0.05 MG/ML IJ SOLN
INTRAMUSCULAR | Status: DC | PRN
  Administered 2011-12-16: 100 ug via INTRAVENOUS

## 2011-12-16 MED ORDER — LIDOCAINE HCL (CARDIAC) 10 MG/ML IV SOLN
INTRAVENOUS | Status: DC | PRN
  Administered 2011-12-16: 50 mg via INTRAVENOUS

## 2011-12-16 MED ORDER — METOPROLOL TARTRATE 1 MG/ML IV SOLN
5.0000 mg | Freq: Four times a day (QID) | INTRAVENOUS | Status: DC
  Administered 2011-12-16 – 2011-12-20 (×16): 5 mg via INTRAVENOUS
  Filled 2011-12-16 (×15): qty 5

## 2011-12-16 MED ORDER — MEGESTROL ACETATE 40 MG/ML PO SUSP: 400.0000 mg | Freq: Every day | ORAL | Status: DC

## 2011-12-16 MED ORDER — LIDOCAINE HCL (PF) 1 % IJ SOLN
INTRAMUSCULAR | Status: AC
  Filled 2011-12-16: qty 5

## 2011-12-16 MED ORDER — FENTANYL CITRATE 0.05 MG/ML IJ SOLN
INTRAMUSCULAR | Status: AC
  Filled 2011-12-16: qty 2

## 2011-12-16 MED ORDER — SUCCINYLCHOLINE CHLORIDE 20 MG/ML IJ SOLN
INTRAMUSCULAR | Status: AC
  Filled 2011-12-16: qty 1

## 2011-12-16 MED ORDER — GLYCOPYRROLATE 0.2 MG/ML IJ SOLN
0.2000 mg | Freq: Once | INTRAMUSCULAR | Status: AC
  Administered 2011-12-16: 0.2 mg via INTRAVENOUS

## 2011-12-16 MED ORDER — METOPROLOL TARTRATE 1 MG/ML IV SOLN
5.0000 mg | Freq: Once | INTRAVENOUS | Status: AC
  Administered 2011-12-16: 5 mg via INTRAVENOUS

## 2011-12-16 MED ORDER — METOPROLOL TARTRATE 50 MG PO TABS: 50.0000 mg | ORAL_TABLET | Freq: Every day | ORAL | Status: DC

## 2011-12-16 MED ORDER — PROPOFOL 10 MG/ML IV EMUL
INTRAVENOUS | Status: AC
  Filled 2011-12-16: qty 20

## 2011-12-16 MED ORDER — MIDAZOLAM HCL 2 MG/2ML IJ SOLN
1.0000 mg | INTRAMUSCULAR | Status: DC | PRN
  Administered 2011-12-16: 2 mg via INTRAVENOUS

## 2011-12-16 MED ORDER — ONDANSETRON HCL 4 MG/2ML IJ SOLN: 4.0000 mg | Freq: Once | INTRAMUSCULAR | Status: DC | PRN

## 2011-12-16 MED ORDER — ONDANSETRON HCL 4 MG/2ML IJ SOLN
INTRAMUSCULAR | Status: AC
  Filled 2011-12-16: qty 2

## 2011-12-16 MED ORDER — ONDANSETRON HCL 4 MG/2ML IJ SOLN
4.0000 mg | Freq: Once | INTRAMUSCULAR | Status: AC
  Administered 2011-12-16: 4 mg via INTRAVENOUS

## 2011-12-16 SURGICAL SUPPLY — 35 items
BAG HAMPER (MISCELLANEOUS) ×2 IMPLANT
BALLN RETRIEVAL 12X15 (BALLOONS) IMPLANT
BALN RTRVL 200 6-7FR 12-15 (BALLOONS)
BASKET TRAPEZOID 3X6 (MISCELLANEOUS) IMPLANT
BRUSH CYTOL RX .035 2.1X8X200 (MISCELLANEOUS) ×1 IMPLANT
BSKT STON RTRVL TRAPEZOID 3X6 (MISCELLANEOUS)
CATH ACCESS DELIVERY (CATHETERS) ×2 IMPLANT
DEVICE INFLATION ENCORE 26 (MISCELLANEOUS) IMPLANT
DEVICE LOCKING W-BIOPSY CAP (MISCELLANEOUS) ×2 IMPLANT
FIBER LASER SLIMLINE GI 365 (MISCELLANEOUS) ×1 IMPLANT
FORCEPS BIOP RJ4 240 HOT (CUTTING FORCEPS) IMPLANT
FORCEPS BIOP SPYBITE 1.2X286 (FORCEP) ×1 IMPLANT
GUIDEWIRE HYDRA JAGWIRE .35 (WIRE) ×1 IMPLANT
GUIDEWIRE JAG HINI 025X260CM (WIRE) IMPLANT
KIT ROOM TURNOVER APOR (KITS) ×2 IMPLANT
LUBRICANT JELLY 4.5OZ STERILE (MISCELLANEOUS) ×2 IMPLANT
NDL HYPO 18GX1.5 BLUNT FILL (NEEDLE) ×1 IMPLANT
NEEDLE HYPO 18GX1.5 BLUNT FILL (NEEDLE) ×2 IMPLANT
NS IRRIG 500ML POUR BTL (IV SOLUTION) ×1 IMPLANT
PAD ARMBOARD 7.5X6 YLW CONV (MISCELLANEOUS) ×2 IMPLANT
PATHFINDER 450CM 0.18 (STENTS) IMPLANT
POSITIONER HEAD 8X9X4 ADT (SOFTGOODS) ×1 IMPLANT
PROBE VISUALIZATION SPYGLASS (PROBE) ×2 IMPLANT
SNARE ROTATE MED OVAL 20MM (MISCELLANEOUS) IMPLANT
SPHINCTEROTOME AUTOTOME .25 (MISCELLANEOUS) IMPLANT
SPHINCTEROTOME HYDRATOME 44 (MISCELLANEOUS) ×2 IMPLANT
SPONGE GAUZE 4X4 12PLY (GAUZE/BANDAGES/DRESSINGS) ×3 IMPLANT
SYR 3ML LL SCALE MARK (SYRINGE) ×2 IMPLANT
SYR 50ML LL SCALE MARK (SYRINGE) ×3 IMPLANT
SYSTEM CONTINUOUS INJECTION (MISCELLANEOUS) ×2 IMPLANT
TUBE IRRIGATION (IRRIGATION / IRRIGATOR) ×1 IMPLANT
TUBING ENDO SMARTCAP PENTAX (MISCELLANEOUS) ×2 IMPLANT
WALLSTENT METAL COVERED 10X60 (STENTS) IMPLANT
WALLSTENT METAL COVERED 10X80 (STENTS) IMPLANT
WATER STERILE IRR 1000ML POUR (IV SOLUTION) ×2 IMPLANT

## 2011-12-16 NOTE — Progress Notes (Signed)
Patient returns from ERCP. Patient lethargic, unable to to take medications by mouth. Spouse concerned about patient's bowel movement, stated, "he has not had a bowel movement since he has been here, I don't remember the last time he had one." Dr. Karilyn Cota made aware.

## 2011-12-16 NOTE — Transfer of Care (Signed)
Immediate Anesthesia Transfer of Care Note  Patient: [name]  Procedure(s) Performed: Procedure(s) (LRB) with comments: ENDOSCOPIC RETROGRADE CHOLANGIOPANCREATOGRAPHY (ERCP) (N/A) - With stenting and/or removal of stones BILIARY STENT PLACEMENT (N/A) SPYGLASS CHOLANGIOSCOPY (N/A)  Patient Location: PACU  Anesthesia Type:General  Level of Consciousness: awake, alert  and oriented  Airway & Oxygen Therapy: Patient Spontanous Breathing and Patient connected to face mask oxygen  Post-op Assessment: Report given to PACU RN  Post vital signs: Reviewed and stable  Complications: No apparent anesthesia complications

## 2011-12-16 NOTE — Progress Notes (Signed)
  Patient Details  Name: [name] MRN: [id number] Date of Birth: 01-15-1929  Today's Date: 12/16/2011 Patient unable to be seen for OT session this PM due to ERCP procedure. Will attempt Monday if appropriate.   Limmie Patricia, OTR/L 12/16/2011, 1:19 PM

## 2011-12-16 NOTE — Anesthesia Postprocedure Evaluation (Addendum)
  Anesthesia Post-op Note  Patient: [name]  Procedure(s) Performed: Procedure(s) (LRB) with comments: ENDOSCOPIC RETROGRADE CHOLANGIOPANCREATOGRAPHY (ERCP) (N/A) - With stenting and/or removal of stones BILIARY STENT PLACEMENT (N/A) SPYGLASS CHOLANGIOSCOPY (N/A)  Patient Location: PACU  Anesthesia Type:General  Level of Consciousness: awake, alert  and oriented  Airway and Oxygen Therapy: Patient Spontanous Breathing and Patient connected to face mask oxygen  Post-op Pain: none  Post-op Assessment: Post-op Vital signs reviewed, Patient's Cardiovascular Status Stable, Respiratory Function Stable, Patent Airway and No signs of Nausea or vomiting  Post-op Vital Signs: Reviewed and stable  Complications: No apparent anesthesia complications 12/18/11  VSS.  Patient alert, but nonverbal on this visit.  Wife states he is at baseline of preop, no apparent anesthesia complications.

## 2011-12-16 NOTE — Anesthesia Procedure Notes (Signed)
Procedure Name: Intubation Date/Time: 12/16/2011 11:03 AM Performed by: Carolyne Littles, Georgene Kopper L Pre-anesthesia Checklist: Patient identified, Patient being monitored, Timeout performed, Emergency Drugs available and Suction available Patient Re-evaluated:Patient Re-evaluated prior to inductionOxygen Delivery Method: Circle System Utilized Preoxygenation: Pre-oxygenation with 100% oxygen Intubation Type: IV induction, Rapid sequence and Cricoid Pressure applied Ventilation: Mask ventilation without difficulty Grade View: Grade I Tube type: Oral Tube size: 7.0 mm Number of attempts: 1 Airway Equipment and Method: stylet and Video-laryngoscopy Placement Confirmation: ETT inserted through vocal cords under direct vision,  positive ETCO2 and breath sounds checked- equal and bilateral Secured at: 21 cm Tube secured with: Tape Dental Injury: Teeth and Oropharynx as per pre-operative assessment

## 2011-12-16 NOTE — Clinical Social Work Note (Signed)
MD discussed goals of care with pt's wife and she was open to hospice consult. Sharyl Nimrod met with with and she would like to consider this. CSW will follow up on Monday if still inpatient to determine disposition. H&P faxed to hospice.  Derenda Fennel, [place] [phone]

## 2011-12-16 NOTE — Brief Op Note (Signed)
12/13/2011 - 12/16/2011  12:40 PM  PATIENT:  [name]  78 y.o. male  PRE-OPERATIVE DIAGNOSIS:  Elevated LFTs, dilated biliary system; stricture at common hepatic duct  POST-OPERATIVE DIAGNOSIS:  Elevated LFTs, dilated biliary system; stricture at common hepatic duct  PROCEDURE:  Procedure(s) (LRB) with comments: ENDOSCOPIC RETROGRADE CHOLANGIOPANCREATOGRAPHY (ERCP) (N/A) - With stenting and/or removal of stones BILIARY STENT PLACEMENT (N/A) SPYGLASS CHOLANGIOSCOPY (N/A)  SURGEON:  Surgeon(s) and Role:    * Malissa Hippo, MD - Primary  Findings; Normal ampulla of Vater. Normal pancreatogram. CBD cannulated leaving guidewire in pancreatic duct. Normal caliber to CBD. High-grade stricture involving common hepatic duct dilation upstream. Biliary sphincterotomy performed. 5 French single pigtail pancreatic stent placed for prophylaxis. Spyglass examination of bile duct performed stricture identified along with abnormal appearing mucosa. Unable to pass biopsy channel to Spyglass. Brushing was taken for cytology. Fully-colored wall flex 10 mm x 60 mm biliary stent placed for decompression. Patient tolerated the procedure well.

## 2011-12-16 NOTE — Progress Notes (Addendum)
Subjective: This man had ERCP yesterday and it appears that he has a stricture involving the common hepatic duct. He has a pancreatic stent placed by Dr Karilyn Cota. He was quite sleepy postprocedure yesterday when I saw him, unfortunately he still remains fairly somnolescent. He has not really had any by mouth intake.           Physical Exam: Blood pressure 161/85, pulse 78, temperature 99 F (37.2 C), temperature source Oral, resp. rate 18, height 5\' 3"  (1.6 m), weight 54.931 kg (121 lb 1.6 oz), SpO2 98.00%. He appears much better hydrated and there is no increased work of breathing. His breathing pattern is very normal at this time. He remains somnolescent.   Investigations:  Recent Results (from the past 240 hour(s))  URINE CULTURE     Status: Normal   Collection Time   12/13/11  7:20 PM      Component Value Range Status Comment   Specimen Description URINE, CLEAN CATCH   Final    Special Requests NONE   Final    Culture  Setup Time 12/13/2011 20:00   Final    Colony Count 9,000 COLONIES/ML   Final    Culture INSIGNIFICANT GROWTH   Final    Report Status 12/15/2011 FINAL   Final   MRSA PCR SCREENING     Status: Normal   Collection Time   12/13/11 10:08 PM      Component Value Range Status Comment   MRSA by PCR NEGATIVE  NEGATIVE Final      Basic Metabolic Panel:  Basename 12/16/11 0550 12/15/11 0548  NA 139 140  K 4.2 3.9  CL 110 113*  CO2 18* 19  GLUCOSE 71 83  BUN 20 16  CREATININE 1.29 1.19  CALCIUM 8.9 8.0*  MG -- --  PHOS -- --   Liver Function Tests:  [facility] 12/16/11 0550 12/15/11 0548  AST 43* 40*  ALT 86* 89*  ALKPHOS 109 100  BILITOT 0.9 0.8  PROT 6.6 5.8*  ALBUMIN 2.9* 2.6*     CBC:  Basename 12/16/11 0550 12/15/11 0548 12/13/11 1453  WBC 7.6 7.4 --  NEUTROABS -- -- 6.1  HGB 11.8* 10.8* --  HCT 35.5* 32.7* --  MCV 96.7 97.3 --  PLT 92* 86* --    Mr Mrcp  12/15/2011  *RADIOLOGY REPORT*  Clinical Data:  Elevated liver function  studies with nausea. Dementia.  MRI ABDOMEN WITHOUT CONTRAST (MRCP)  Technique: Multiplanar multisequence MR imaging of the abdomen was performed, including heavily T2-weighted images of the biliary and pancreatic ducts.  Three-dimensional MR images were rendered by post processing of the original MR data.  Comparison:  Abdominal CT 09/03/2010 and 03/23/2004.  Right upper quadrant abdominal ultrasound 12/13/2011.  Findings:  Study is mildly limited by breathing artifact due to the patient's dementia.  Intravenous contrast was not administered.  There is moderate diffuse intrahepatic biliary dilatation.  There is an abrupt change in the caliber of the ductal system within the hepatic hilum with a focal high-grade stricture measuring 9 mm in length.  The common bile duct within and immediately superior to the pancreatic head is normal in caliber.  There is no definite evidence of choledocholithiasis.  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 The gallbladder appears normal.  The pancreas and pancreatic duct appear normal.  There is no evidence of pancreas divisum.  No focal hepatic abnormalities are seen.  The spleen  and adrenal glands appear normal.  Bilateral renal cysts are unchanged.  There is possible diffuse gastric wall thickening versus incomplete distension.  No enlarged lymph nodes are seen within the upper abdomen.  IMPRESSION:  1.  Moderate diffuse intrahepatic biliary dilatation with high- grade long segment stricture near the hepatic hilum.  No definite evidence of choledocholithiasis.  Findings are concerning for a malignant stricture (probably a cholangiocarcinoma).  Detail is limited by breathing artifact and the lack of intravenous contrast. 2.  The pancreas appears normal.  There is no evidence of metastatic disease. 3.  Possible diffuse gastric wall thickening versus incomplete distension.  The patient may be able to better tolerate abdominal  CT with contrast if further imaging is desired prior to ERCP.  I have left a telephone message to discuss these findings with Dr. Karilyn Cota.   Original Report Authenticated By: Carey Bullocks, M.D.    Mr 3d Recon At Scanner  12/15/2011  *RADIOLOGY REPORT*  Clinical Data:  Elevated liver function studies with nausea. Dementia.  MRI ABDOMEN WITHOUT CONTRAST (MRCP)  Technique: Multiplanar multisequence MR imaging of the abdomen was performed, including heavily T2-weighted images of the biliary and pancreatic ducts.  Three-dimensional MR images were rendered by post processing of the original MR data.  Comparison:  Abdominal CT 09/03/2010 and 03/23/2004.  Right upper quadrant abdominal ultrasound 12/13/2011.  Findings:  Study is mildly limited by breathing artifact due to the patient's dementia.  Intravenous contrast was not administered.  There is moderate diffuse intrahepatic biliary dilatation.  There is an abrupt change in the caliber of the ductal system within the hepatic hilum with a focal high-grade stricture measuring 9 mm in length.  The common bile duct within and immediately superior to the pancreatic head is normal in caliber.  There is no definite evidence of choledocholithiasis.  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 The gallbladder appears normal.  The pancreas and pancreatic duct appear normal.  There is no evidence of pancreas divisum.  No focal hepatic abnormalities are seen.  The spleen and adrenal glands appear normal.  Bilateral renal cysts are unchanged.  There is possible diffuse gastric wall thickening versus incomplete distension.  No enlarged lymph nodes are seen within the upper abdomen.  IMPRESSION:  1.  Moderate diffuse intrahepatic biliary dilatation with high- grade long segment stricture near the hepatic hilum.  No definite evidence of choledocholithiasis.  Findings are concerning for a malignant stricture (probably a  cholangiocarcinoma).  Detail is limited by breathing artifact and the lack of intravenous contrast. 2.  The pancreas appears normal.  There is no evidence of metastatic disease. 3.  Possible diffuse gastric wall thickening versus incomplete distension.  The patient may be able to better tolerate abdominal CT with contrast if further imaging is desired prior to ERCP.  I have left a telephone message to discuss these findings with Dr. Karilyn Cota.   Original Report Authenticated By: Carey Bullocks, M.D.    Dg Chest Port 1 View  12/15/2011  *RADIOLOGY REPORT*  Clinical Data: Chest congestion.  PORTABLE CHEST - 1 VIEW  Comparison: 12/13/2011  Findings: Reverse lordotic projection noted.  Density projecting over the upper chest likely represents the anterior neck cystic lesion previously worked up on ultrasound of 09/06/2010.  Atherosclerotic calcification of the aortic arch noted with mild aortic tortuosity.  Accounting for the projection, the lungs appear otherwise clear.  IMPRESSION:  1.  The lungs appear clear. 2.  Aortic  atherosclerosis. 3.  Density projecting over the upper mediastinum is believed to reflect the known cystic lesion in the lower neck.   Original Report Authenticated By: Gaylyn Rong, M.D.    Dg Ercp  12/16/2011  *RADIOLOGY REPORT*  Clinical Data: Common bile duct stricture.  ERCP ERCP performed.  Fluoro time, 4 minutes and 48 seconds.  Comparison:  MRI, 12/15/2011  Technique:  Multiple spot images obtained with the fluoroscopic device and submitted for interpretation post-procedure.  ERCP was performed by Dr. Karilyn Cota.  Findings: Only two images were provided.  Initial image shows placement the scope with a follow-up image showing placement of a long common bile duct stent.  By report, no stones are seen. Brushing cytology was performed.  IMPRESSION: ERCP with stent placement across the long common duct stricture.  These images were submitted for radiologic interpretation only. Please see the  procedural report for the amount of contrast and the fluoroscopy time utilized.   Original Report Authenticated By: Amie Portland, M.D.       Medications: I have reviewed the patient's current medications.  Impression: 1. Metabolic encephalopathy secondary to dehydration and hypernatremia, not improving today. 2. Hypernatremia secondary to dehydration, resolved. 3. Abnormal elevation of liver enzymes with intrahepatic diffuse biliary dilatation with high grade long segment stricture near the hepatic hilum, findings concerning for malignant stricture. LFTs approaching normality now. Amylase normal. 4. Moderate to severe dementia. 5. Palliative performance score 20% at best, prognosis poor. Patient is hospice eligible, also I believe he is hospice home eligible with a prognosis of less than 6-8 weeks.      Plan: 1. Reduce IV fluids to [facility]. 2. Patient is really medically stable to be discharged to a skilled nursing facility. The patient's wife has decided that that would be the best option at this time, although she understands that a hospice home may be a reasonable option in the near future if he does not improve. 3. Disposition-skilled nursing facility today if bed is available.    LOS: 3 days   Wilson Singer Pager [phone]  12/16/2011, 2:27 PM

## 2011-12-16 NOTE — Anesthesia Preprocedure Evaluation (Signed)
Anesthesia Evaluation  Patient identified by MRN, date of birth, ID band Patient awake    Reviewed: Allergy & Precautions, H&P , NPO status , Patient's Chart, lab work & pertinent test results, reviewed documented beta blocker date and time   Airway Mallampati: III TM Distance: >3 FB   Mouth opening: Limited Mouth Opening  Dental  (+) Poor Dentition and Chipped   Pulmonary  + rhonchi         Cardiovascular hypertension, Pt. on medications + CAD, + Cardiac Stents, + Peripheral Vascular Disease and +CHF Rhythm:Regular Rate:Normal     Neuro/Psych PSYCHIATRIC DISORDERS (dementia, ox0)  Neuromuscular disease CVA, Residual Symptoms    GI/Hepatic   Endo/Other    Renal/GU Renal disease     Musculoskeletal   Abdominal   Peds  Hematology   Anesthesia Other Findings   Reproductive/Obstetrics                           Anesthesia Physical Anesthesia Plan  ASA: III  Anesthesia Plan: General   Post-op Pain Management:    Induction: Intravenous, Rapid sequence and Cricoid pressure planned  Airway Management Planned: Oral ETT  Additional Equipment:   Intra-op Plan:   Post-operative Plan: Extubation in OR  Informed Consent: I have reviewed the patients History and Physical, chart, labs and discussed the procedure including the risks, benefits and alternatives for the proposed anesthesia with the patient or authorized representative who has indicated his/her understanding and acceptance.     Plan Discussed with:   Anesthesia Plan Comments:         Anesthesia Quick Evaluation

## 2011-12-16 NOTE — Preoperative (Signed)
Beta Blockers   Reason not to administer Beta Blockers:Not Applicable 

## 2011-12-16 NOTE — Therapy (Signed)
SPEECH PATHOLOGY  Consult received for BSE, however SLP unable to see pt due to NPO status for ERCP procedure and he is still in procedure. SLP last saw [name] about one year ago after acute CVA with residual dysphagia in setting of dementia. Family chose "safest diet" recommendation at that time: puree thinned to nectar-like consistency and nectar thick liquids due to severe dysphagia.   Plan: Attempt BSE after procedure as schedule permits.  Thank you,  [name], CCC-SLP [phone]

## 2011-12-17 LAB — AMYLASE: Amylase: 59 U/L (ref 0–105)

## 2011-12-17 NOTE — Progress Notes (Signed)
Subjective; Patient unable to provide any history. According to Mrs. Petro he has not been in pain he continues to cough. He was able to swallow some Jell-O at breakfast. Objective; BP 164/60  Pulse 57  Temp 98.5 F (36.9 C) (Axillary)  Resp 20  Ht 5\' 3"  (1.6 m)  Wt 121 lb 1.6 oz (54.931 kg)  BMI 21.45 kg/m2  SpO2 96% Patient is awake and has eye contact. His abdomen is soft and without guarding. Lab data; WBC 7.6, H&H 11.8 and 35.5 and platelet count 92K. Serum amylase 59 Bilirubin oh 0.9, AP 109, AST 43, ALT 86 and albumin 2.9. Assessment; #1. Biliary stricture involving common hepatic duct. Status post wall stenting yesterday. Cytology would be back next week. Malignancy suspected. No significant change in transaminases since stent placed. Imaging studies reviewed with patient's family members #2.Oropharyngeal dysphagia. #3. Thrombocytopenia. Platelet count was low normal on admission and has been gradually decreasing. May be secondary to biliary tract infection her antibiotic. Recommendations; Will repeat LFTs in a.m. Further evaluation and treatment of oropharyngeal dysphagia per Dr. Karilyn Cota. Will discontinue antibiotic in a.m.

## 2011-12-17 NOTE — Plan of Care (Signed)
Problem: Phase I Progression Outcomes Goal: Voiding-avoid urinary catheter unless indicated Outcome: Not Progressing Pt is palative care

## 2011-12-17 NOTE — Clinical Social Work Note (Signed)
CSW contacted by [facility] re: SNF dispo. No beds available at [facility], [facility], or [facility] over weekend. Per Eunice Blase at [facility], male bed available Monday 12/9. Weekday CSW to f/u.  Dellie Burns, MSW, LCSWA [phone] (Weekends 8:00am-4:30pm)

## 2011-12-17 NOTE — Op Note (Signed)
NAMEADALID, BECKMANN NO.:  [id number]  MEDICAL RECORD NO.:  [id number]  LOCATION:  A323                          FACILITY:  [facility]  PHYSICIAN:  Lionel December, M.D.    DATE OF BIRTH:  1929-09-21  DATE OF PROCEDURE:  12/16/2011 DATE OF DISCHARGE:                              OPERATIVE REPORT   PROCEDURE:  ERCP with biliary stenting (Wallstent)along with prophylactic pancreatic stenting.  INDICATION:  The patient is 78 year old African American male with multiple medical problems, who was admitted with mental status changes. On workup, found to have elevated transaminases and dilated biliary system.  MRCP suggests stricture involving proximal common hepatic duct suspicious for tumor.  He is undergoing diagnostic/therapeutic procedure.  Procedure risks were reviewed with the patient and informed consent was obtained.  All questions were answered.  We discussed that if indeed he has a stricture likelihood of malignancy is very high and Wallstent would be placed, so that he would not need multiple ERCPs.  He was agreeable.  MEDS FOR SEDATION/ANESTHESIA:  Please see anesthesia records for details.  PROCEDURE PERFORMED IN THE OR:  After induction of endotracheal general anesthesia, the patient was placed in semi-prone position.  Time-out was carried out.  Therapeutic Pentax video duodenoscope was passed through the oropharynx without any difficulty into esophagus and stomach. Antral mucosa was normal.  Scope was passed across the pylorus into bulb and descending duodenum.  Ampulla of Vater appeared to be normal.  Cannulation attempted with RX 44 Autotome and 035 Hydra Jagwire.  Pancreatic duct was initially cannulated and filled with dilute contrast and no abnormality noted. Selective cannulation of bile duct was difficult and facilitated by leaving guidewire in the pancreatic duct.  One CBD was cannulated, cholangiogram was obtained.  CBD was of normal caliber.   High-grade stricture noted at the proximal common hepatic duct.  Segment of common hepatic duct proximal to it was dilated along with dilated intrahepatic biliary radicals.  Biliary sphincterotomy was performed followed by placement of 5 French single pigtail pancreatic stent. SpyGlass examination of the duct was also performed.  There was some fleshy tissue and stricture.  Unable to pass the biopsy forceps.  Brushing cytology was obtained.   A 10-mm x 60-mm fully covered WallFlex biliary stent was placed. Proximal margin of the stent was well above the level of stricture. Waist was obvious at the stricture site.  Once the stent was fully deployed, there was gush of contrast and bile into the duodenum. Endoscope was withdrawn.  The patient tolerated the procedure well and he is in the process of being extubated.  FINAL DIAGNOSIS:  Normal pancreatogram.  High-grade stricture involving proximal common hepatic duct suspicious for neoplasm as neoplasm based on Spyglass examination.  Brush cytology obtained.  A 10-mm x 60-mm fully covered WallFlex biliary stent was placed.  A 5-French single pigtail pancreatic stent was also placed for prophylactic purposes.  RECOMMENDATIONS:  Clear liquids today.  No aspirin or other anti-platelet agents for 72 hours.  We will check labs in a.m.  We will leave him on Rocephin for another 48 hours.          ______________________________ Lionel December, M.D.  NR/MEDQ  D:  12/16/2011  T:  12/17/2011  Job:  [id number]  cc:   Madelin Rear. Sherwood Gambler, MD Fax: [phone]

## 2011-12-18 ENCOUNTER — Inpatient Hospital Stay (HOSPITAL_COMMUNITY): Payer: Medicare Other

## 2011-12-18 LAB — COMPREHENSIVE METABOLIC PANEL
ALT: 109 U/L — ABNORMAL HIGH (ref 0–53)
AST: 74 U/L — ABNORMAL HIGH (ref 0–37)
Alkaline Phosphatase: 231 U/L — ABNORMAL HIGH (ref 39–117)
CO2: 21 mEq/L (ref 19–32)
Chloride: 102 mEq/L (ref 96–112)
Creatinine, Ser: 1.29 mg/dL (ref 0.50–1.35)
GFR calc non Af Amer: 51 mL/min — ABNORMAL LOW (ref >90)
Potassium: 4.4 mEq/L (ref 3.5–5.1)
Total Bilirubin: 1.6 mg/dL — ABNORMAL HIGH (ref 0.3–1.2)

## 2011-12-18 MED ORDER — SODIUM CHLORIDE 0.9 % IV SOLN
INTRAVENOUS | Status: DC
  Administered 2011-12-18: 50 mL/h via INTRAVENOUS

## 2011-12-18 MED ORDER — POTASSIUM CHLORIDE 10 MEQ/100ML IV SOLN
INTRAVENOUS | Status: AC
  Filled 2011-12-18: qty 100

## 2011-12-18 NOTE — Progress Notes (Addendum)
Objective; BP 146/81  Pulse 96  Temp 98.3 F (36.8 C) (Axillary)  Resp 20  Ht 5\' 3"  (1.6 m)  Wt 121 lb 1.6 oz (54.931 kg)  BMI 21.45 kg/m2  SpO2 99% Patient is resting. His abdomen is soft and nontender. Lab data; Bilirubin 1.6, AP 231, AST 74 and ALT 109. BUN 21 and creatinine 1.29. Assessment; Biliary stricture status post wall stenting 2 days with rising bilirubin and transaminases which means stent is not working. If this trend continues will consider removing wall stent and just place 2 plastic stents if [name] would agree. I will talk with her when she is here later today. Will get portable KUB to evaluate stent position. CBC and comprehensive chemistry panel ordered for tomorrow.  Addendum at 1:50 PM Wallstent appears to be in good position. Condition reviewed with patient's wife and their daughter. Will check LFTs in a.m. as above.

## 2011-12-18 NOTE — Plan of Care (Signed)
Problem: Phase I Progression Outcomes Goal: Initial discharge plan identified Outcome: Progressing Discussed what facility that the patient would like to go to.  Note written to social worker in this regards.

## 2011-12-18 NOTE — Addendum Note (Signed)
Addendum  created 12/18/11 1319 by Moshe Salisbury, CRNA   Modules edited:Notes Section

## 2011-12-18 NOTE — Progress Notes (Signed)
Chart reviewed.  Discussed with Dr. Karilyn Cota.  Subjective: Per patient's wife, sleepy today.  Objective: Vital signs in last 24 hours: Filed Vitals:   12/18/11 0623 12/18/11 0910 12/18/11 1030 12/18/11 1447  BP: 150/86  146/81 112/69  Pulse: 96     Temp: 99.2 F (37.3 C)  98.3 F (36.8 C) 98.8 F (37.1 C)  TempSrc: Axillary  Axillary Oral  Resp: 20  20 20   Height:      Weight:      SpO2: 96% 100% 99% 98%   Weight change:   Intake/Output Summary (Last 24 hours) at 12/18/11 1511 Last data filed at 12/18/11 1032  Gross per 24 hour  Intake    120 ml  Output   1600 ml  Net  -1480 ml   General: Awake. Groggy. Neck: Large neck mass, chronic Lungs clear to auscultation bilaterally without wheezes rhonchi or rales Cardiovascular regular rate rhythm without murmurs gallops rubs Abdomen soft nontender nondistended Extremities no clubbing cyanosis or edema  Lab Results: Basic Metabolic Panel:  Lab 12/18/11 2956 12/16/11 0550  NA 134* 139  K 4.4 4.2  CL 102 110  CO2 21 18*  GLUCOSE 80 71  BUN 21 20  CREATININE 1.29 1.29  CALCIUM 8.9 8.9  MG -- --  PHOS -- --   Liver Function Tests:  Lab 12/18/11 0650 12/16/11 0550  AST 74* 43*  ALT 109* 86*  ALKPHOS 231* 109  BILITOT 1.6* 0.9  PROT 6.5 6.6  ALBUMIN 2.7* 2.9*    Lab 12/17/11 0630 12/13/11 1453  LIPASE -- 25  AMYLASE 59 --   No results found for this basename: AMMONIA:2 in the last 168 hours CBC:  Lab 12/16/11 0550 12/15/11 0548 12/13/11 1453  WBC 7.6 7.4 --  NEUTROABS -- -- 6.1  HGB 11.8* 10.8* --  HCT 35.5* 32.7* --  MCV 96.7 97.3 --  PLT 92* 86* --   Cardiac Enzymes: No results found for this basename: CKTOTAL:3,CKMB:3,CKMBINDEX:3,TROPONINI:3 in the last 168 hours BNP: No results found for this basename: PROBNP:3 in the last 168 hours D-Dimer: No results found for this basename: DDIMER:2 in the last 168 hours CBG: No results found for this basename: GLUCAP:6 in the last 168 hours Hemoglobin  A1C: No results found for this basename: HGBA1C in the last 168 hours Fasting Lipid Panel: No results found for this basename: CHOL,HDL,LDLCALC,TRIG,CHOLHDL,LDLDIRECT in the last 213 hours Thyroid Function Tests: No results found for this basename: TSH,T4TOTAL,FREET4,T3FREE,THYROIDAB in the last 168 hours Coagulation:  Lab 12/15/11 0548 12/13/11 1453  LABPROT 14.9 14.3  INR 1.19 1.13   Urinalysis:  Lab 12/13/11 1920  COLORURINE YELLOW  LABSPEC >1.030*  PHURINE 5.5  GLUCOSEU NEGATIVE  HGBUR LARGE*  BILIRUBINUR NEGATIVE  KETONESUR NEGATIVE  PROTEINUR 30*  UROBILINOGEN 1.0  NITRITE NEGATIVE  LEUKOCYTESUR NEGATIVE   Micro Results: Recent Results (from the past 240 hour(s))  URINE CULTURE     Status: Normal   Collection Time   12/13/11  7:20 PM      Component Value Range Status Comment   Specimen Description URINE, CLEAN CATCH   Final    Special Requests NONE   Final    Culture  Setup Time 12/13/2011 20:00   Final    Colony Count 9,000 COLONIES/ML   Final    Culture INSIGNIFICANT GROWTH   Final    Report Status 12/15/2011 FINAL   Final   MRSA PCR SCREENING     Status: Normal   Collection Time  12/13/11 10:08 PM      Component Value Range Status Comment   MRSA by PCR NEGATIVE  NEGATIVE Final     Scheduled Meds:   . metoprolol  5 mg Intravenous Q6H   Continuous Infusions:   . sodium chloride 0.45 % with kcl 20 mL/hr at 12/17/11 1255   PRN Meds:.acetaminophen, acetaminophen, ALPRAZolam, bisacodyl, haloperidol lactate, ondansetron (ZOFRAN) IV, polyethylene glycol, traZODone Assessment/Plan: Principal Problem:  *Metabolic encephalopathy Active Problems: High-grade common hepatic duct stricture, suspicious for neoplasm, status post stenting: Plans per Dr. Dionicia Abler.  Senile dementia with delirium  ARF (acute renal failure)  Hypernatremia  Elevated LFTs  CAD (coronary artery disease)  Thyroglossal duct cyst  Dysphagia  Check liver function tests in the morning.  Change IV fluids to saline. Eventual skilled nursing facility placement.   LOS: 5 days   Lorain Fettes L 12/18/2011, 3:11 PM

## 2011-12-19 LAB — CBC
HCT: 33.7 % — ABNORMAL LOW (ref 39.0–52.0)
Hemoglobin: 11.4 g/dL — ABNORMAL LOW (ref 13.0–17.0)
MCV: 94.7 fL (ref 78.0–100.0)
RBC: 3.56 MIL/uL — ABNORMAL LOW (ref 4.22–5.81)
WBC: 9.3 10*3/uL (ref 4.0–10.5)

## 2011-12-19 LAB — COMPREHENSIVE METABOLIC PANEL
Albumin: 2.7 g/dL — ABNORMAL LOW (ref 3.5–5.2)
BUN: 22 mg/dL (ref 6–23)
Calcium: 8.9 mg/dL (ref 8.4–10.5)
Creatinine, Ser: 1.11 mg/dL (ref 0.50–1.35)
GFR calc Af Amer: 70 mL/min — ABNORMAL LOW (ref >90)
Glucose, Bld: 74 mg/dL (ref 70–99)
Potassium: 4 mEq/L (ref 3.5–5.1)
Total Protein: 6.5 g/dL (ref 6.0–8.3)

## 2011-12-19 NOTE — Progress Notes (Signed)
Talked with [facility] in physical therapy. She states that Rubye [place] is already gone for today but will be here early in the morning.  Voiced with her that the patient had had a speech therapy consult put in appr on 12/6 and the MD was wondering when there would be a f/u with the order.  She stated she would pass the message on to [place].  I passed this conversation on to Dr. Lendell Caprice and she verbalized understanding.

## 2011-12-19 NOTE — Progress Notes (Addendum)
Patient ID: [name], male   DOB: 1930-01-10, 78 y.o.   MRN: [id number] S. Wife in room. Patient is lethargic. Ate poorly at breakfast.  Apparently received Haldol during the night. He tried to get out of bed. Underwent an ERCP 12/13/2011 for elevated transaminases and dilated biliary system:FINAL DIAGNOSIS: Normal pancreatogram.  High-grade stricture involving proximal common hepatic duct suspicious  for neoplasm as neoplasm based on Spyglass examination. Brush cytology  obtained.  A 10-mm x 60-mm fully covered WallFlex biliary stent was placed.  A 5-French single pigtail pancreatic stent was also placed for  prophylactic purposes.     Tawana Scale Vitals:   12/18/11 1447 12/18/11 2254 12/19/11 0246 12/19/11 0601  BP: 112/69 152/92 171/76 164/95  Pulse:  88 81 90  Temp: 98.8 F (37.1 C) 98.3 F (36.8 C) 98.2 F (36.8 C) 97.7 F (36.5 C)  TempSrc: Oral Oral Oral Oral  Resp: 20 20 20 18   Height:      Weight:    120 lb 5.9 oz (54.6 kg)  SpO2: 98% 97% 98% 97%   CMP     Component Value Date/Time   NA 136 12/19/2011 0529   K 4.0 12/19/2011 0529   CL 107 12/19/2011 0529   CO2 20 12/19/2011 0529   GLUCOSE 74 12/19/2011 0529   BUN 22 12/19/2011 0529   CREATININE 1.11 12/19/2011 0529   CALCIUM 8.9 12/19/2011 0529   PROT 6.5 12/19/2011 0529   ALBUMIN 2.7* 12/19/2011 0529   AST 69* 12/19/2011 0529   ALT 99* 12/19/2011 0529   ALKPHOS 268* 12/19/2011 0529   BILITOT 1.4* 12/19/2011 0529   GFRNONAA 61* 12/19/2011 0529   GFRAA 70* 12/19/2011 0529   BUN 21 and creatinine 1.29.  Assessment;  Biliary stricture status post wall stenting., Bilirubin down to 1.4  ALP, AST and ALT elevated. Dr. Karilyn Cota is aware.    GI attending note; Patient's condition reviewed with Mrs. Pricilla Holm. Bilirubin is down slightly along with drop in AST and ALT but alkaline phosphatase is up slightly in the last 24 hours. Will continue to monitor LFTs and determine whether or not he needs to have stent repositioned or  changed. If patient is transferred to nursing home I will continue to follow him closely.

## 2011-12-19 NOTE — Progress Notes (Signed)
UR Chart Review Completed  

## 2011-12-19 NOTE — Progress Notes (Signed)
Pt was found in the floor.  Pt does not c/o any pain.  Pt was transferred back to bed.  Dr. Lendell Caprice and the pts daughter was notified after I had attempted to call his  Wife.  I notified Cecily and asked her for a sitter. Will continue to monitor. MD said it was okay to remove his foley.

## 2011-12-19 NOTE — Progress Notes (Signed)
Subjective: Per RN, dysphagia  Objective: Vital signs in last 24 hours: Filed Vitals:   12/19/11 0246 12/19/11 0601 12/19/11 1000 12/19/11 1524  BP: 171/76 164/95 146/90 124/83  Pulse: 81 90 82 91  Temp: 98.2 F (36.8 C) 97.7 F (36.5 C) 98.2 F (36.8 C) 98.3 F (36.8 C)  TempSrc: Oral Oral    Resp: 20 18 18 18   Height:      Weight:  54.6 kg (120 lb 5.9 oz)    SpO2: 98% 97% 96% 94%   Weight change:   Intake/Output Summary (Last 24 hours) at 12/19/11 1635 Last data filed at 12/19/11 1457  Gross per 24 hour  Intake    260 ml  Output    950 ml  Net   -690 ml   General: Awake. comfortable Neck: Large neck mass, chronic Lungs clear to auscultation bilaterally without wheezes rhonchi or rales Cardiovascular regular rate rhythm without murmurs gallops rubs Abdomen soft nontender nondistended Extremities no clubbing cyanosis or edema  Lab Results: Basic Metabolic Panel:  Lab 12/19/11 7829 12/18/11 0650  NA 136 134*  K 4.0 4.4  CL 107 102  CO2 20 21  GLUCOSE 74 80  BUN 22 21  CREATININE 1.11 1.29  CALCIUM 8.9 8.9  MG -- --  PHOS -- --   Liver Function Tests:  Lab 12/19/11 0529 12/18/11 0650  AST 69* 74*  ALT 99* 109*  ALKPHOS 268* 231*  BILITOT 1.4* 1.6*  PROT 6.5 6.5  ALBUMIN 2.7* 2.7*    Lab 12/17/11 0630 12/13/11 1453  LIPASE -- 25  AMYLASE 59 --   No results found for this basename: AMMONIA:2 in the last 168 hours CBC:  Lab 12/19/11 0529 12/16/11 0550 12/13/11 1453  WBC 9.3 7.6 --  NEUTROABS -- -- 6.1  HGB 11.4* 11.8* --  HCT 33.7* 35.5* --  MCV 94.7 96.7 --  PLT 101* 92* --   Cardiac Enzymes: No results found for this basename: CKTOTAL:3,CKMB:3,CKMBINDEX:3,TROPONINI:3 in the last 168 hours BNP: No results found for this basename: PROBNP:3 in the last 168 hours D-Dimer: No results found for this basename: DDIMER:2 in the last 168 hours CBG: No results found for this basename: GLUCAP:6 in the last 168 hours Hemoglobin A1C: No results  found for this basename: HGBA1C in the last 168 hours Fasting Lipid Panel: No results found for this basename: CHOL,HDL,LDLCALC,TRIG,CHOLHDL,LDLDIRECT in the last 562 hours Thyroid Function Tests: No results found for this basename: TSH,T4TOTAL,FREET4,T3FREE,THYROIDAB in the last 168 hours Coagulation:  Lab 12/15/11 0548 12/13/11 1453  LABPROT 14.9 14.3  INR 1.19 1.13   Urinalysis:  Lab 12/13/11 1920  COLORURINE YELLOW  LABSPEC >1.030*  PHURINE 5.5  GLUCOSEU NEGATIVE  HGBUR LARGE*  BILIRUBINUR NEGATIVE  KETONESUR NEGATIVE  PROTEINUR 30*  UROBILINOGEN 1.0  NITRITE NEGATIVE  LEUKOCYTESUR NEGATIVE   Micro Results: Recent Results (from the past 240 hour(s))  URINE CULTURE     Status: Normal   Collection Time   12/13/11  7:20 PM      Component Value Range Status Comment   Specimen Description URINE, CLEAN CATCH   Final    Special Requests NONE   Final    Culture  Setup Time 12/13/2011 20:00   Final    Colony Count 9,000 COLONIES/ML   Final    Culture INSIGNIFICANT GROWTH   Final    Report Status 12/15/2011 FINAL   Final   MRSA PCR SCREENING     Status: Normal   Collection Time  12/13/11 10:08 PM      Component Value Range Status Comment   MRSA by PCR NEGATIVE  NEGATIVE Final     Scheduled Meds:    . metoprolol  5 mg Intravenous Q6H  . [EXPIRED] potassium chloride       Continuous Infusions:    . sodium chloride 50 mL/hr (12/18/11 1649)   PRN Meds:.acetaminophen, acetaminophen, ALPRAZolam, bisacodyl, haloperidol lactate, ondansetron (ZOFRAN) IV, polyethylene glycol, traZODone Assessment/Plan: Principal Problem:  *Metabolic encephalopathy Active Problems: High-grade common hepatic duct stricture, suspicious for neoplasm, status post stenting: Plans per Dr. Dionicia Abler.  Senile dementia with delirium  ARF (acute renal failure)  Hypernatremia  Elevated LFTs  CAD (coronary artery disease)  Thyroglossal duct cyst  Dysphagia, chronic  Hopefully, SNF tomorrow.   Prognosis guarded.   LOS: 6 days   Aidan Caloca L 12/19/2011, 4:35 PM

## 2011-12-19 NOTE — Care Management Note (Unsigned)
    Page 1 of 1   12/19/2011     4:41:30 PM   CARE MANAGEMENT NOTE 12/19/2011  Patient:  [name], [name]   Account Number:  [id number]  Date Initiated:  12/19/2011  Documentation initiated by:  Rosemary Holms  Subjective/Objective Assessment:   Pt admitted from home where he lives with wife. [facility] also follows pt.     Action/Plan:   CSW working to place pt at [facility] once medically stable   Anticipated DC Date:  12/20/2011   Anticipated DC Plan:  SKILLED NURSING FACILITY  In-house referral  Clinical Social Worker      DC Planning Services  CM consult      Choice offered to / List presented to:             Status of service:  In process, will continue to follow Medicare Important Message given?   (If response is "NO", the following Medicare IM given date fields will be blank) Date Medicare IM given:   Date Additional Medicare IM given:    Discharge Disposition:    Per UR Regulation:    If discussed at Long Length of Stay Meetings, dates discussed:    Comments:  12/19/11 Rosemary Holms RN [facility] CM

## 2011-12-19 NOTE — Progress Notes (Signed)
Occupational Therapy Treatment Patient Details Name: [name] MRN: [id number] DOB: [birth year] Today's Date: 12/19/2011 Time: 0201-0240 OT Time Calculation (min): 39 min  OT Assessment / Plan / Recommendation Comments on Treatment Session      Follow Up Recommendations       Barriers to Discharge       Equipment Recommendations       Recommendations for Other Services    Frequency     Plan      Precautions / Restrictions Restrictions Weight Bearing Restrictions: No   Pertinent Vitals/Pain     ADL  Grooming: Performed;Wash/dry hands;Wash/dry face;Moderate assistance Where Assessed - Grooming: Supported sitting Equipment Used: Gait belt Transfers/Ambulation Related to ADLs: before 10th visit  ADL Comments: Pt. transferred into chair with mod assist.    OT Diagnosis:    OT Problem List: Decreased strength;Cardiopulmonary status limiting activity OT Treatment Interventions:     OT Goals Acute Rehab OT Goals OT Goal Formulation: With patient/family Time For Goal Achievement: 12/28/11 Potential to Achieve Goals: Fair ADL Goals Pt Will Perform Grooming: with supervision;Sitting, edge of bed Pt Will Perform Upper Body Bathing: with supervision;Sitting, edge of bed Pt Will Perform Lower Body Bathing: with supervision;Sit to stand from bed Pt Will Perform Upper Body Dressing: with supervision;Sitting, bed Pt Will Perform Lower Body Dressing: with supervision;Sit to stand from bed Pt Will Transfer to Toilet: with supervision;3-in-1;Stand pivot transfer Pt Will Perform Toileting - Clothing Manipulation: with supervision;Sitting on 3-in-1 or toilet Pt Will Perform Toileting - Hygiene: with supervision;Sit to stand from 3-in-1/toilet;Leaning right and/or left on 3-in-1/toilet  Visit Information  Last OT Received On: 12/19/11 Assistance Needed: +1    Subjective Data      Prior Functioning  Home Living Lives With: Spouse Available Help at Discharge:  Family;Available 24 hours/day Type of Home: House Home Access: Stairs to enter [employer] of Steps: 2 Entrance Stairs-Rails: None Home Layout: One level Bathroom Shower/Tub: Tub/shower unit;Door;Curtain Firefighter: Standard Bathroom Accessibility: Yes How Accessible: Accessible via walker Home Adaptive Equipment: Shower chair with back;Walker - four wheeled;Bedside commode/3-in-1 Prior Function Level of Independence: Needs assistance Needs Assistance: Bathing;Meal Prep;Light Housekeeping Bath: Supervision/set-up Meal Prep: Total Light Housekeeping: Total Driving: No Vocation: Retired Musician: Expressive difficulties Dominant Hand: Right    Cognition  Overall Cognitive Status: History of cognitive impairments - further impaired Area of Impairment: Following commands;Safety/judgement;Attention Arousal/Alertness: Awake/alert Orientation Level: Disoriented to;Place;Time;Situation Behavior During Session: Restless Following Commands: Follows one step commands inconsistently Safety/Judgement: Decreased safety judgement for tasks assessed Safety/Judgement - Other Comments: Pt offten tried to pull out his IV and Designer, multimedia - Other Comments: wife states that pt often loses orientation when out of home environment      Shoulder Instructions         Exercises         End of Session OT - End of Session Equipment Utilized During Treatment: Gait belt Activity Tolerance: Patient limited by fatigue Patient left: in chair;with call bell/phone within reach;with bed alarm set;with chair alarm set Nurse Communication: Mobility status  GO     [name] L 12/19/2011, 3:32 PM

## 2011-12-19 NOTE — Clinical Social Work Note (Signed)
Pt's wife has decided she wants pt to go to SNF. Bed offers given and she chooses [facility]. Facility notified. Awaiting stability for d/c.   Derenda Fennel, LCSW [phone]

## 2011-12-20 ENCOUNTER — Encounter (HOSPITAL_COMMUNITY): Payer: Self-pay | Admitting: Internal Medicine

## 2011-12-20 DIAGNOSIS — N289 Disorder of kidney and ureter, unspecified: Secondary | ICD-10-CM

## 2011-12-20 DIAGNOSIS — K831 Obstruction of bile duct: Secondary | ICD-10-CM

## 2011-12-20 DIAGNOSIS — I10 Essential (primary) hypertension: Secondary | ICD-10-CM | POA: Diagnosis present

## 2011-12-20 DIAGNOSIS — IMO0002 Reserved for concepts with insufficient information to code with codable children: Secondary | ICD-10-CM | POA: Diagnosis present

## 2011-12-20 LAB — HEPATIC FUNCTION PANEL
ALT: 109 U/L — ABNORMAL HIGH (ref 0–53)
AST: 86 U/L — ABNORMAL HIGH (ref 0–37)
Albumin: 2.8 g/dL — ABNORMAL LOW (ref 3.5–5.2)
Bilirubin, Direct: 0.9 mg/dL — ABNORMAL HIGH (ref 0.0–0.3)
Total Bilirubin: 1.5 mg/dL — ABNORMAL HIGH (ref 0.3–1.2)

## 2011-12-20 MED ORDER — CLONAZEPAM 0.5 MG PO TABS: 0.2500 mg | ORAL_TABLET | Freq: Three times a day (TID) | ORAL | Status: DC | PRN

## 2011-12-20 MED ORDER — ACETAMINOPHEN 500 MG PO TABS: 500.0000 mg | ORAL_TABLET | Freq: Four times a day (QID) | ORAL | Status: DC | PRN

## 2011-12-20 NOTE — Discharge Summary (Signed)
Physician Discharge Summary  Patient ID: [name] MRN: [id number] DOB/AGE: Jul 27, 1929 78 y.o.  Admit date: 12/13/2011 Discharge date: 12/20/2011  Discharge Diagnoses:  Principal Problem:  *Metabolic encephalopathy Active Problems:  Dysphagia  Failure to thrive  Biliary stricture  Senile dementia with delirium  Acute renal insufficiency  Hypernatremia  Elevated LFTs  CAD (coronary artery disease)  Thrombocytopenia  Thyroglossal duct cyst  Dementia  Benign hypertension  Studies pending at the time of discharge: Cytology from stricture of proximal common hepatic duct, Dr. Karilyn Cota to followup results.    Medication List     As of 12/20/2011 11:49 AM    STOP taking these medications         ALPRAZolam 1 MG tablet   Commonly known as: XANAX      ramipril 10 MG capsule   Commonly known as: ALTACE      simvastatin 40 MG tablet   Commonly known as: ZOCOR      TAKE these medications         acetaminophen 500 MG tablet   Commonly known as: TYLENOL   Take 1 tablet (500 mg total) by mouth every 6 (six) hours as needed for pain or fever.      aspirin 81 MG tablet   Take 162 mg by mouth daily.      clonazePAM 0.5 MG tablet   Commonly known as: KLONOPIN   Take 0.5 tablets (0.25 mg total) by mouth 3 (three) times daily as needed for anxiety. Family member states if patient has to take Alprazolam, she does not give him any Clonazepam      docusate sodium 100 MG capsule   Commonly known as: COLACE   Take 100 mg by mouth 3 (three) times daily.      megestrol 40 MG/ML suspension   Commonly known as: MEGACE   Take 400 mg by mouth daily.      metoprolol 50 MG tablet   Commonly known as: LOPRESSOR   Take 50 mg by mouth daily.      omeprazole 20 MG capsule   Commonly known as: PRILOSEC   Take 20 mg by mouth daily.      Tamsulosin HCl 0.4 MG Caps   Commonly known as: FLOMAX   Take 0.4 mg by mouth daily.            Discharge Orders    Future Orders Please  Complete By Expires   Discharge instructions      Comments:   Speech therapy to continue to follow. Diet should be pured with honey thickened liquids via teaspoon and. Patient should be alert and upright at 90 angle. Crush medications with pured. Multiple dry swallows after each bite. Patient is to remain upright 30-60 minutes after each meal. Oral care before and after. Oral suction as needed. Liver function tests to be drawn on Monday, 12/26/2011. Call results to Dr. Karilyn Cota.   Walk with assistance         Follow-up Information    Follow up with SNF provider . (within one week)          Disposition: SNF  Discharged Condition:  stable  Consults: Treatment Team:  Malissa Hippo, MD  Labs:    Sodium   149 140 139  134 136     Potassium   3.4 3.9 4.2  4.4 4.0     Chloride   119 113 110  102 107     CO2   20 19 18  21 20     BUN   28 16 20  21 22      Creatinine, Ser   1.45 1.19 1.29  1.29 1.11     Calcium   8.9 8.0 8.9  8.9 8.9     GFR calc non Af Amer   44 56 51  51 61     GFR calc Af Amer   51 65 59  59 70     Glucose, Bld   93 83 71  80 74     Alkaline Phosphatase   119 100 109  231 268 390    Albumin   3.0 2.6 2.9  2.7 2.7 2.8    Amylase      59       Lipase   25          AST   57 40 43  74 69 86    ALT   133 89 86  109 99 109    Total Protein   6.5 5.8 6.6  6.5 6.5 6.8    Bilirubin, Direct         0.9    Indirect Bilirubin         0.6    Total Bilirubin   0.9 0.8 0.9  1.6 1.4 1.5    CBC   WBC   9.9 7.4 7.6   9.3     RBC   3.60 3.36 3.67   3.56     Hemoglobin   11.5 10.8 11.8   11.4     HCT   35.7 32.7 35.5   33.7     MCV   99.2 97.3 96.7   94.7     MCH   31.9 32.1 32.2   32.0     MCHC   32.2 33.0 33.2   33.8     RDW   12.0 11.7 11.7   11.8     Platelets   107 86 92   101     DIFFERENTIAL   Neutrophils Relative   74          Lymphocytes Relative   13          Monocytes Relative   12          Eosinophils Relative   1          Basophils Relative   1           Neutro Abs   6.1          Lymphs Abs   1.1          Monocytes Absolute   1.0          Eosinophils Absolute   0.1          Basophils Absolute   0.1          PROTIME W/ INR   Prothrombin Time   14.3 14.9         INR   1.13 1.19         PTT   aPTT   29          DIABETES   Glucose, Bld   93 83 71  80 74     URINALYSIS   Color, Urine   YELLOW          APPearance   CLEAR          Specific Gravity, Urine   >1.030  pH   5.5          Glucose, UA   NEGATIVE          Bilirubin Urine   NEGATIVE          Ketones, ur   NEGATIVE          Protein, ur   30          Urobilinogen, UA   1.0          Nitrite   NEGATIVE          Leukocytes, UA   NEGATIVE          Hgb urine dipstick   LARGE          Urine-Other   MUCOUS PRESENT          WBC, UA   0-2          RBC / HPF   TOO NUMEROUS TO COUNT          Squamous Epithelial / LPF   FEW          Bacteria, UA   FEW          Casts   HYALINE CASTS   Diagnostics:  Dg Chest 1 View  12/13/2011  *RADIOLOGY REPORT*  Clinical Data: Altered mental status  CHEST - 1 VIEW  Comparison: 09/15/2011 and 12/13/2011  Findings: Cardiomediastinal silhouette is stable.  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  No acute infiltrate or pleural effusion.  No pulmonary edema. Atherosclerotic calcifications of thoracic aorta again noted.  IMPRESSION: No active disease.  No significant change.   Original Report Authenticated By: Natasha Mead, M.D.    Ct Head Wo Contrast  12/13/2011  *RADIOLOGY REPORT*  Clinical Data: Dementia.  Weakness.  Altered mental status. Hypertension.  CT HEAD WITHOUT CONTRAST  Technique:  Contiguous axial images were obtained from the base of the skull through the vertex without contrast.  Comparison: 09/16/2011 MR.  09/15/2011 CT.  Findings: No intracranial hemorrhage.  Significant confluent white matter type changes periventricular and subcortical region.  This limits detection of small acute infarct. No CT evidence of large acute infarct.  Global  atrophy without hydrocephalus.  No intracranial mass lesion detected on this unenhanced exam.  Vascular calcifications.  Polypoid opacification right maxillary sinus.  IMPRESSION: No intracranial hemorrhage.  Significant small vessel disease type changes limits detection of small acute infarct however, no CT evidence of large acute infarct.   Original Report Authenticated By: Lacy Duverney, M.D.    Mr Mrcp  12/15/2011  *RADIOLOGY REPORT*  Clinical Data:  Elevated liver function studies with nausea. Dementia.  MRI ABDOMEN WITHOUT CONTRAST (MRCP)  Technique: Multiplanar multisequence MR imaging of the abdomen was performed, including heavily T2-weighted images of the biliary and pancreatic ducts.  Three-dimensional MR images were rendered by post processing of the original MR data.  Comparison:  Abdominal CT 09/03/2010 and 03/23/2004.  Right upper quadrant abdominal ultrasound 12/13/2011.  Findings:  Study is mildly limited by breathing artifact due to the patient's dementia.  Intravenous contrast was not administered.  There is moderate diffuse intrahepatic biliary dilatation.  There is an abrupt change in the caliber of the ductal system within the hepatic hilum with a focal high-grade stricture measuring 9 mm in length.  The common bile duct within and immediately superior to the pancreatic head is normal in caliber.  There is no definite evidence of choledocholithiasis.  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 The gallbladder appears normal.  The pancreas and pancreatic duct appear normal.  There is no evidence of pancreas divisum.  No focal hepatic abnormalities are seen.  The spleen and adrenal glands appear normal.  Bilateral renal cysts are unchanged.  There is possible diffuse gastric wall thickening versus incomplete distension.  No enlarged lymph nodes are seen within the upper abdomen.  IMPRESSION:  1.  Moderate diffuse intrahepatic biliary  dilatation with high- grade long segment stricture near the hepatic hilum.  No definite evidence of choledocholithiasis.  Findings are concerning for a malignant stricture (probably a cholangiocarcinoma).  Detail is limited by breathing artifact and the lack of intravenous contrast. 2.  The pancreas appears normal.  There is no evidence of metastatic disease. 3.  Possible diffuse gastric wall thickening versus incomplete distension.  The patient may be able to better tolerate abdominal CT with contrast if further imaging is desired prior to ERCP.  I have left a telephone message to discuss these findings with Dr. Karilyn Cota.   Original Report Authenticated By: Carey Bullocks, M.D.    Mr 3d Recon At Scanner  12/15/2011  *RADIOLOGY REPORT*  Clinical Data:  Elevated liver function studies with nausea. Dementia.  MRI ABDOMEN WITHOUT CONTRAST (MRCP)  Technique: Multiplanar multisequence MR imaging of the abdomen was performed, including heavily T2-weighted images of the biliary and pancreatic ducts.  Three-dimensional MR images were rendered by post processing of the original MR data.  Comparison:  Abdominal CT 09/03/2010 and 03/23/2004.  Right upper quadrant abdominal ultrasound 12/13/2011.  Findings:  Study is mildly limited by breathing artifact due to the patient's dementia.  Intravenous contrast was not administered.  There is moderate diffuse intrahepatic biliary dilatation.  There is an abrupt change in the caliber of the ductal system within the hepatic hilum with a focal high-grade stricture measuring 9 mm in length.  The common bile duct within and immediately superior to the pancreatic head is normal in caliber.  There is no definite evidence of choledocholithiasis.  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 The gallbladder appears normal.  The pancreas and pancreatic duct appear normal.  There is no evidence of pancreas divisum.  No focal hepatic  abnormalities are seen.  The spleen and adrenal glands appear normal.  Bilateral renal cysts are unchanged.  There is possible diffuse gastric wall thickening versus incomplete distension.  No enlarged lymph nodes are seen within the upper abdomen.  IMPRESSION:  1.  Moderate diffuse intrahepatic biliary dilatation with high- grade long segment stricture near the hepatic hilum.  No definite evidence of choledocholithiasis.  Findings are concerning for a malignant stricture (probably a cholangiocarcinoma).  Detail is limited by breathing artifact and the lack of intravenous contrast. 2.  The pancreas appears normal.  There is no evidence of metastatic disease. 3.  Possible diffuse gastric wall thickening versus incomplete distension.  The patient may be able to better tolerate abdominal CT with contrast if further imaging is desired prior to ERCP.  I have left a telephone message to discuss these findings with Dr. Karilyn Cota.   Original Report Authenticated By: Carey Bullocks, M.D.    Dg Chest Port 1 View  12/15/2011  *RADIOLOGY REPORT*  Clinical Data: Chest congestion.  PORTABLE CHEST - 1 VIEW  Comparison: 12/13/2011  Findings: Reverse lordotic projection noted.  Density projecting over the upper chest likely represents the anterior neck cystic lesion previously worked up on ultrasound of 09/06/2010.  Atherosclerotic  calcification of the aortic arch noted with mild aortic tortuosity.  Accounting for the projection, the lungs appear otherwise clear.  IMPRESSION:  1.  The lungs appear clear. 2.  Aortic atherosclerosis. 3.  Density projecting over the upper mediastinum is believed to reflect the known cystic lesion in the lower neck.   Original Report Authenticated By: Gaylyn Rong, M.D.    Dg Ercp  12/16/2011  *RADIOLOGY REPORT*  Clinical Data: Common bile duct stricture.  ERCP ERCP performed.  Fluoro time, 4 minutes and 48 seconds.  Comparison:  MRI, 12/15/2011  Technique:  Multiple spot images obtained with  the fluoroscopic device and submitted for interpretation post-procedure.  ERCP was performed by Dr. Karilyn Cota.  Findings: Only two images were provided.  Initial image shows placement the scope with a follow-up image showing placement of a long common bile duct stent.  By report, no stones are seen. Brushing cytology was performed.  IMPRESSION: ERCP with stent placement across the long common duct stricture.  These images were submitted for radiologic interpretation only. Please see the procedural report for the amount of contrast and the fluoroscopy time utilized.   Original Report Authenticated By: Amie Portland, M.D.    Dg Abd Portable 1v  12/18/2011  *RADIOLOGY REPORT*  Clinical Data: 78 year old male - evaluate biliary stent.  PORTABLE ABDOMEN - 1 VIEW  Comparison: 12/16/2011 ERCP and stent placement.  Findings: A CBD stent is identified overlying the right abdomen, with mild narrowing along its midportion. No other definite complicating features are noted.  Bowel gas pattern is unremarkable. No acute bony abnormalities are present.  IMPRESSION: CBD stent overlying the right upper abdomen with narrowing of its midportion.   Original Report Authenticated By: Harmon Pier, M.D.    US Abdomen Limited Ruq  12/13/2011  *RADIOLOGY REPORT*  Clinical Data:  Elevated LFTs, question cholelithiasis  LIMITED ABDOMINAL ULTRASOUND - RIGHT UPPER QUADRANT  Comparison:  None  Findings:  Gallbladder:  Suboptimally distended.  Small amount of probable dependent sludge with mild wall thickening, which could be due to edema or to underdistension.  No sonographic Murphy's sign or definite pericholecystic fluid.  No definite discrete shadowing calculi identified.  Common bile duct:  Dilated 11 mm diameter.  Questionable intraluminal echogenic focus within CBD, question artifact, unable to exclude choledocolithiasis.  Liver:  Normal parenchymal echogenicity.  Intrahepatic biliary dilatation.  No focal mass lesion.  No right upper  quadrant ascites identified. Visualized portion of pancreas unremarkable.  IMPRESSION: Intrahepatic and extrahepatic biliary dilatation with questionable intraluminal echogenic focus/calculus versus artifact. In the setting of elevated LFTs, consider follow-up MRCP imaging. Incompletely distended gallbladder with a slightly thickened wall and likely dependent sludge. No definite gallstones or sonographic Murphy's sign identified.                    Original Report Authenticated By: Ulyses Southward, M.D.     Procedures:  ERCP with biliary stenting (Wallstent), pancreatic stenting, brush cytology obtained of high-grade stricture involving the proximal common hepatic duct, suspicious for neoplasm  EKG: Normal sinus rhythm Moderate voltage criteria for LVH, may be normal variant Nonspecific T wave abnormality  DNR   Hospital Course: See H&P for complete admission details. [name] is an 78 year old black male with dementia, chronic severe oral pharyngeal dysphagia. He had been living at home, being cared for by his wife. He had worsening confusion for several days. In the ER, he had a heart rate of 100, otherwise normal vital signs. He was confused and  only oriented to person. He had very dry mucous membranes. Chronic thyroglossal duct cyst noted. Lungs were clear. Abdomen soft and nontender. Sodium was found to be 153. Chloride 121. BUN 33. Creatinine 1.76. Baseline creatinine is usually approximately 1. Alkaline phosphatase was 154. AST 80. ALT 191. Chest x-ray was negative. CT brain showed nothing acute. Ultrasound of the abdomen showed intrahepatic and extrahepatic biliary dilatation with questionable intraluminal echogenic focus/calculus versus artifact.  Patient was admitted, and treated for his dehydration, acute renal insufficiency hypernatremia and metabolic encephalopathy. MRCP was done, results as above. Subsequently, ERCP was done. A severe stenosis was noted at the proximal common hepatic duct.  This was stented with a Wallstent by Dr. Karilyn Cota. He also prophylactically stented the pancreatic duct. Please see his operative note. The stricture was brushed and sent for cytology. Cytology is pending at this time, but GI feels that this is likely neoplasm. Patient also has severe oral pharyngeal dysphasia noted from previous admissions. He was maintained on a pured diet with honey thickened liquids. He likely continues to aspirate. The patient's wife reports that he would not want feeding tube. His CODE STATUS is DO NOT RESUSCITATE. Patient would be hospice eligible based on his failure to thrive/severe protein calorie malnutrition/severe oral pharyngeal dysphasia, and certainly if this biliary stricture is in fact malignant. His prognosis is likely much less than 6 months, and the patient's wife voices understanding.  The wife, however, would like to wait on hospice. If patient should continue to fail to thrive or his clinical status otherwise worsens, this discussion will need to be repeated in patient's wife will likely agree to comfort measures. The patient's liver function tests remain slightly elevated after stent. Dr. Harl Favor recommends repeating the liver function tests in a week. He will decide at that time, if still elevated whether any further intervention would be helpful. Also, Dr. Karilyn Cota will follow up the cytology.  Speech therapy reevaluated the patient, recommendations as above. A modified barium swallow was not felt to really had much. Speech should continue to follow at the nursing home. Total time of this discharge 60 minutes.  Discharge Exam:  Blood pressure 159/86, pulse 93, temperature 98.1 F (36.7 C), temperature source Oral, resp. rate 20, height 5\' 3"  (1.6 m), weight 54.6 kg (120 lb 5.9 oz), SpO2 95.00%.  General: Alert. Coughing. Confused. Weak. Lungs clear to auscultation bilaterally without wheezes rhonchi or rales Cardiovascular regular rate rhythm without murmurs  gallops rubs Abdomen soft nontender nondistended  extremities no clubbing cyanosis or edema  Signed: Zakyra Kukuk L 12/20/2011, 11:49 AM

## 2011-12-20 NOTE — Progress Notes (Signed)
UR chart review completed.  

## 2011-12-20 NOTE — Clinical Social Work Placement (Signed)
Clinical Social Work Department CLINICAL SOCIAL WORK PLACEMENT NOTE 12/20/2011  Patient:  ESAUL, DORWART  Account Number:  [id number] Admit date:  12/13/2011  Clinical Social Worker:  Derenda Fennel, LCSW  Date/time:  12/15/2011 09:25 AM  Clinical Social Work is seeking post-discharge placement for this patient at the following level of care:   SKILLED NURSING   (*CSW will update this form in Epic as items are completed)   12/15/2011  Patient/family provided with [facility] Department of Clinical Social Work's list of facilities offering this level of care within the geographic area requested by the patient (or if unable, by the patient's family).  12/15/2011  Patient/family informed of their freedom to choose among providers that offer the needed level of care, that participate in Medicare, Medicaid or managed care program needed by the patient, have an available bed and are willing to accept the patient.  12/15/2011  Patient/family informed of [facility]' ownership interest in [facility], as well as of the fact that they are under no obligation to receive care at this facility.  PASARR submitted to EDS on 12/15/2011 PASARR number received from EDS on 12/15/2011  FL2 transmitted to all facilities in geographic area requested by pt/family on  12/15/2011 FL2 transmitted to all facilities within larger geographic area on   Patient informed that his/her managed care company has contracts with or will negotiate with  certain facilities, including the following:     Patient/family informed of bed offers received:  12/20/2011 Patient chooses bed at [facility] [place] Physician recommends and patient chooses bed at  [facility] [place]  Patient to be transferred to [facility] [place] on  12/20/2011 Patient to be transferred to facility by family  The following physician request were entered in Epic:   Additional Comments: existing pasarr  Derenda Fennel,  LCSW [phone]

## 2011-12-20 NOTE — Evaluation (Signed)
Clinical/Bedside Swallow Evaluation Patient Details  Name: [name] MRN: [id number] Date of Birth: Apr 08, 1929  Today's Date: 12/20/2011 Time: 7829-5621 SLP Time Calculation (min): 35 min  Past Medical History:  Past Medical History  Diagnosis Date  . Stroke   . Hypertension   . Coronary artery disease 09/2004    With stenting  . Frequent falls 12/29/2010  . Dementia   . Anemia 12/29/2010  . Thrombocytopenia 12/29/2010  . Senile dementia with delirium 12/29/2010  . 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 Cerebellar artery occlusion or stenosis 12/29/2010  . Basilar artery stenosis 12/29/2010  . Thyroglossal duct cyst   . Dysphagia 12/31/2010  . Tardive dyskinesia 12/31/2010  . Radial neuropathy 01/01/2011  . Encephalopathy    Past Surgical History:  Past Surgical History  Procedure Date  . Cardiac surgery   . Coronary angioplasty   . Ercp 12/16/2011    Procedure: ENDOSCOPIC RETROGRADE CHOLANGIOPANCREATOGRAPHY (ERCP);  Surgeon: Malissa Hippo, MD;  Location: [facility];  Service: Endoscopy;  Laterality: N/A;  . Biliary stent placement 12/16/2011    Procedure: BILIARY STENT PLACEMENT;  Surgeon: Malissa Hippo, MD;  Location: [facility];  Service: Endoscopy;  Laterality: N/A;  . Spyglass cholangioscopy 12/16/2011    Procedure: SPYGLASS CHOLANGIOSCOPY;  Surgeon: Malissa Hippo, MD;  Location: [facility];  Service: Endoscopy;  Laterality: N/A;   HPI:  Mr. [name] is an 36 man who was admitted last week from home due to change in mental status. He was found to be severely dehydrated upon admission. [name] is known to this SLP from previous admission and MBSS one year ago. He was discharged on a "safest diet" of nectar-thick liquids and thinned out puree (to about nectar) due to severe oropharyngeal dysphagia in setting of dementia. Family does not wish to pursue feeding tubes as this would not decrease risk of aspiration. Mrs.  Petrey reports that over the past year, her husband grew stronger and was able to tolerate soft foods and thin liquids. He currently presents with positive signs of aspiration per wife. SLP is asked to provide recommendations for a "safest diet" and family/caregiver training.    Assessment / Plan / Recommendation Clinical Impression  Pt presents with positive signs and symptoms of aspiration at bedside. He is still somewhat sedated from medication received at 11pm last night, but does open eyes and converse when spoken to. I spoke to his wife at length about the risks of aspiration and she acknowledges that her goal is to feed him orally, but to minimize his risks with modified diet and compensatory strategies. At this time, I do not feel that MBSS would add anything to the clinical picture given that we will use a "safest diet" recommendation regardless. Mr. Zorn has a wet vocal quality with delayed throat clearing during po consumption even with honey-thick liquids. Recommend Dysphagia 1/puree with honey-thick liquids via teaspoon presentation when pt is most alert and upright. Clinically, his current presentation is similar to last year's and once he improved medically and physically, he tolerated a less restrictive diet. Pt may benefit from MBSS as an outpatient while at [facility] once improved clinically to assess for diet upgrade. Wife is in agreement with plan.    Aspiration Risk  Moderate    Diet Recommendation Dysphagia 1 (Puree);Honey-thick liquid   Liquid Administration via: Spoon Medication Administration: Crushed with puree Supervision: Staff feed patient;Trained caregiver to feed patient;Full supervision/cueing for compensatory strategies Compensations: Slow rate;Small  sips/bites;Check for pocketing;Multiple dry swallows after each bite/sip;Effortful swallow Postural Changes and/or Swallow Maneuvers: Seated upright 90 degrees;Upright 30-60 min after meal    Other  Recommendations Oral  Care Recommendations: Oral care before and after PO;Staff/trained caregiver to provide oral care Other Recommendations: Order thickener from pharmacy;Clarify dietary restrictions   Follow Up Recommendations  Skilled Nursing facility    Frequency and Duration min 2x/week  1 week       SLP Swallow Goals Patient will consume recommended diet without observed clinical signs of aspiration with: Moderate assistance Patient will utilize recommended strategies during swallow to increase swallowing safety with: Moderate assistance   Swallow Study Prior Functional Status   Living at home with wife, consumed soft foods and thin liquids.    General Date of Onset: 12/14/11 HPI: Mr. [name] is an 63 man who was admitted last week from home due to change in mental status. He was found to be severely dehydrated upon admission. [name] is known to this SLP from previous admission and MBSS one year ago. He was discharged on a "safest diet" of nectar-thick liquids and thinned out puree (to about nectar) due to severe oropharyngeal dysphagia in setting of dementia. Family does not wish to pursue feeding tubes as this would not decrease risk of aspiration. [name] reports that over the past year, her husband grew stronger and was able to tolerate soft foods and thin liquids. He currently presents with positive signs of aspiration per wife. SLP is asked to provide recommendations for a "safest diet" and family/caregiver training.  Type of Study: Bedside swallow evaluation Previous Swallow Assessment: 01/03/2012 MBSS: severe oropharyngeal dysphagia, rec NTL only Diet Prior to this Study: Dysphagia 1 (puree);Thin liquids Temperature Spikes Noted: No Respiratory Status: Room air History of Recent Intubation: No Behavior/Cognition: Lethargic;Requires cueing;Pleasant mood;Decreased sustained attention Oral Cavity - Dentition: Missing dentition;Adequate natural dentition Self-Feeding Abilities: Needs  assist Patient Positioning: Upright in bed Baseline Vocal Quality: Wet Volitional Cough: Strong Volitional Swallow: Able to elicit    Oral/Motor/Sensory Function Lingual Strength: Reduced Facial Strength: Reduced Mandible: Impaired   Ice Chips Ice chips: Impaired Presentation: Spoon Oral Phase Impairments: Reduced labial seal Oral Phase Functional Implications: Prolonged oral transit Pharyngeal Phase Impairments: Throat Clearing - Immediate   Thin Liquid Thin Liquid: Not tested    Nectar Thick Nectar Thick Liquid: Not tested   Honey Thick Honey Thick Liquid: Impaired Presentation: Spoon Oral Phase Impairments: Reduced labial seal Oral Phase Functional Implications: Prolonged oral transit Pharyngeal Phase Impairments: Suspected delayed Swallow;Decreased hyoid-laryngeal movement;Multiple swallows;Wet Vocal Quality;Throat Clearing - Delayed   Puree Puree: Impaired   Solid   Thank you,  [name], CCC-SLP [phone]     Solid: Not tested       PORTER,DABNEY 12/20/2011,9:52 AM

## 2011-12-20 NOTE — Progress Notes (Signed)
Called report to [place], nurse @ [facility] [place].  Verbalized understanding.  Pt dc'd to facility with wife.  [place], Candelaria Stagers 12/20/2011

## 2011-12-20 NOTE — Clinical Social Work Note (Signed)
Pt d/c today to [facility]. Pt's wife and facility aware and agreeable. Pt's wife requesting to transfer pt herself. D/C summary faxed.  Derenda Fennel, [place] [phone]

## 2012-01-01 ENCOUNTER — Emergency Department (HOSPITAL_COMMUNITY): Payer: Medicare Other

## 2012-01-01 ENCOUNTER — Inpatient Hospital Stay (HOSPITAL_COMMUNITY)
Admission: EM | Admit: 2012-01-01 | Discharge: 2012-01-05 | DRG: 640 | Disposition: A | Discharge status: Skilled Nursing Facility | Payer: Medicare Other | Attending: Internal Medicine | Admitting: Internal Medicine

## 2012-01-01 ENCOUNTER — Encounter (HOSPITAL_COMMUNITY): Payer: Self-pay

## 2012-01-01 DIAGNOSIS — I5032 Chronic diastolic (congestive) heart failure: Secondary | ICD-10-CM

## 2012-01-01 DIAGNOSIS — I69328 Other speech and language deficits following cerebral infarction: Secondary | ICD-10-CM

## 2012-01-01 DIAGNOSIS — Z9181 History of falling: Secondary | ICD-10-CM

## 2012-01-01 DIAGNOSIS — F0390 Unspecified dementia without behavioral disturbance: Secondary | ICD-10-CM

## 2012-01-01 DIAGNOSIS — E46 Unspecified protein-calorie malnutrition: Secondary | ICD-10-CM | POA: Diagnosis present

## 2012-01-01 DIAGNOSIS — F05 Delirium due to known physiological condition: Secondary | ICD-10-CM | POA: Diagnosis present

## 2012-01-01 DIAGNOSIS — Z9861 Coronary angioplasty status: Secondary | ICD-10-CM

## 2012-01-01 DIAGNOSIS — Z79899 Other long term (current) drug therapy: Secondary | ICD-10-CM

## 2012-01-01 DIAGNOSIS — R7989 Other specified abnormal findings of blood chemistry: Secondary | ICD-10-CM

## 2012-01-01 DIAGNOSIS — R296 Repeated falls: Secondary | ICD-10-CM

## 2012-01-01 DIAGNOSIS — R269 Unspecified abnormalities of gait and mobility: Secondary | ICD-10-CM

## 2012-01-01 DIAGNOSIS — R1310 Dysphagia, unspecified: Secondary | ICD-10-CM

## 2012-01-01 DIAGNOSIS — Q892 Congenital malformations of other endocrine glands: Secondary | ICD-10-CM

## 2012-01-01 DIAGNOSIS — E876 Hypokalemia: Secondary | ICD-10-CM | POA: Diagnosis present

## 2012-01-01 DIAGNOSIS — R509 Fever, unspecified: Secondary | ICD-10-CM

## 2012-01-01 DIAGNOSIS — I651 Occlusion and stenosis of basilar artery: Secondary | ICD-10-CM

## 2012-01-01 DIAGNOSIS — Z7982 Long term (current) use of aspirin: Secondary | ICD-10-CM

## 2012-01-01 DIAGNOSIS — IMO0002 Reserved for concepts with insufficient information to code with codable children: Secondary | ICD-10-CM

## 2012-01-01 DIAGNOSIS — Z681 Body mass index (BMI) 19 or less, adult: Secondary | ICD-10-CM

## 2012-01-01 DIAGNOSIS — I69928 Other speech and language deficits following unspecified cerebrovascular disease: Secondary | ICD-10-CM

## 2012-01-01 DIAGNOSIS — I2510 Atherosclerotic heart disease of native coronary artery without angina pectoris: Secondary | ICD-10-CM

## 2012-01-01 DIAGNOSIS — C249 Malignant neoplasm of biliary tract, unspecified: Secondary | ICD-10-CM

## 2012-01-01 DIAGNOSIS — N390 Urinary tract infection, site not specified: Secondary | ICD-10-CM

## 2012-01-01 DIAGNOSIS — E870 Hyperosmolality and hypernatremia: Secondary | ICD-10-CM

## 2012-01-01 DIAGNOSIS — R627 Adult failure to thrive: Secondary | ICD-10-CM | POA: Diagnosis present

## 2012-01-01 DIAGNOSIS — N289 Disorder of kidney and ureter, unspecified: Secondary | ICD-10-CM

## 2012-01-01 DIAGNOSIS — Z23 Encounter for immunization: Secondary | ICD-10-CM

## 2012-01-01 DIAGNOSIS — I509 Heart failure, unspecified: Secondary | ICD-10-CM | POA: Diagnosis present

## 2012-01-01 DIAGNOSIS — D696 Thrombocytopenia, unspecified: Secondary | ICD-10-CM

## 2012-01-01 DIAGNOSIS — E860 Dehydration: Secondary | ICD-10-CM

## 2012-01-01 DIAGNOSIS — D649 Anemia, unspecified: Secondary | ICD-10-CM

## 2012-01-01 DIAGNOSIS — Z66 Do not resuscitate: Secondary | ICD-10-CM | POA: Diagnosis present

## 2012-01-01 DIAGNOSIS — K831 Obstruction of bile duct: Secondary | ICD-10-CM

## 2012-01-01 DIAGNOSIS — G9340 Encephalopathy, unspecified: Secondary | ICD-10-CM

## 2012-01-01 HISTORY — DX: Do not resuscitate: Z66

## 2012-01-01 HISTORY — DX: Abnormal results of liver function studies: R94.5

## 2012-01-01 HISTORY — DX: Obstruction of bile duct: K83.1

## 2012-01-01 HISTORY — DX: Reserved for concepts with insufficient information to code with codable children: IMO0002

## 2012-01-01 HISTORY — DX: Other specified abnormal findings of blood chemistry: R79.89

## 2012-01-01 LAB — URINALYSIS, ROUTINE W REFLEX MICROSCOPIC
Bilirubin Urine: NEGATIVE
Ketones, ur: 15 mg/dL — AB
Nitrite: POSITIVE — AB
Specific Gravity, Urine: 1.025 (ref 1.005–1.030)
Urobilinogen, UA: 4 mg/dL — ABNORMAL HIGH (ref 0.0–1.0)
pH: 5 (ref 5.0–8.0)

## 2012-01-01 LAB — CBC WITH DIFFERENTIAL/PLATELET
Basophils Relative: 0 % (ref 0–1)
Eosinophils Absolute: 0 10*3/uL (ref 0.0–0.7)
HCT: 35.8 % — ABNORMAL LOW (ref 39.0–52.0)
Hemoglobin: 11.4 g/dL — ABNORMAL LOW (ref 13.0–17.0)
Lymphocytes Relative: 5 % — ABNORMAL LOW (ref 12–46)
Monocytes Relative: 8 % (ref 3–12)
Neutro Abs: 16.4 10*3/uL — ABNORMAL HIGH (ref 1.7–7.7)
Neutrophils Relative %: 87 % — ABNORMAL HIGH (ref 43–77)
RBC: 3.62 MIL/uL — ABNORMAL LOW (ref 4.22–5.81)
WBC: 18.8 10*3/uL — ABNORMAL HIGH (ref 4.0–10.5)

## 2012-01-01 LAB — TROPONIN I: Troponin I: <0.3 ng/mL (ref <0.30)

## 2012-01-01 LAB — BASIC METABOLIC PANEL
Chloride: 122 mEq/L — ABNORMAL HIGH (ref 96–112)
GFR calc Af Amer: 55 mL/min — ABNORMAL LOW (ref >90)
GFR calc non Af Amer: 48 mL/min — ABNORMAL LOW (ref >90)
Potassium: 3.8 mEq/L (ref 3.5–5.1)
Sodium: 156 mEq/L — ABNORMAL HIGH (ref 135–145)

## 2012-01-01 LAB — HEPATIC FUNCTION PANEL
Albumin: 2.5 g/dL — ABNORMAL LOW (ref 3.5–5.2)
Indirect Bilirubin: 1.1 mg/dL — ABNORMAL HIGH (ref 0.3–0.9)
Total Protein: 7.3 g/dL (ref 6.0–8.3)

## 2012-01-01 LAB — AMMONIA: Ammonia: 20 umol/L (ref 11–60)

## 2012-01-01 LAB — URINE MICROSCOPIC-ADD ON

## 2012-01-01 MED ORDER — ALUM & MAG HYDROXIDE-SIMETH 200-200-20 MG/5ML PO SUSP: 30.0000 mL | Freq: Four times a day (QID) | ORAL | Status: DC | PRN

## 2012-01-01 MED ORDER — PANTOPRAZOLE SODIUM 40 MG PO TBEC
40.0000 mg | DELAYED_RELEASE_TABLET | Freq: Every day | ORAL | Status: DC
  Administered 2012-01-01 – 2012-01-02 (×2): 40 mg via ORAL
  Filled 2012-01-01 (×2): qty 1

## 2012-01-01 MED ORDER — ACETAMINOPHEN 650 MG RE SUPP: 650.0000 mg | Freq: Four times a day (QID) | RECTAL | Status: DC | PRN

## 2012-01-01 MED ORDER — SODIUM CHLORIDE 0.9 % IV SOLN
INTRAVENOUS | Status: AC
  Administered 2012-01-01: 75 mL/h via INTRAVENOUS

## 2012-01-01 MED ORDER — SODIUM CHLORIDE 0.9 % IV BOLUS (SEPSIS)
250.0000 mL | Freq: Once | INTRAVENOUS | Status: AC
  Administered 2012-01-01: 250 mL via INTRAVENOUS

## 2012-01-01 MED ORDER — DEXTROSE 5 % IV SOLN
1.0000 g | Freq: Once | INTRAVENOUS | Status: AC
  Administered 2012-01-01: 1 g via INTRAVENOUS
  Filled 2012-01-01: qty 10

## 2012-01-01 MED ORDER — METOPROLOL TARTRATE 50 MG PO TABS
50.0000 mg | ORAL_TABLET | Freq: Every day | ORAL | Status: DC
  Administered 2012-01-01 – 2012-01-02 (×2): 50 mg via ORAL
  Filled 2012-01-01 (×2): qty 1

## 2012-01-01 MED ORDER — CIPROFLOXACIN IN D5W 400 MG/200ML IV SOLN
400.0000 mg | Freq: Two times a day (BID) | INTRAVENOUS | Status: DC
  Administered 2012-01-02 – 2012-01-03 (×3): 400 mg via INTRAVENOUS
  Filled 2012-01-01 (×4): qty 200

## 2012-01-01 MED ORDER — ONDANSETRON HCL 4 MG PO TABS: 4.0000 mg | ORAL_TABLET | Freq: Four times a day (QID) | ORAL | Status: DC | PRN

## 2012-01-01 MED ORDER — ALPRAZOLAM 0.5 MG PO TABS
0.5000 mg | ORAL_TABLET | Freq: Three times a day (TID) | ORAL | Status: DC | PRN
  Administered 2012-01-01 – 2012-01-04 (×5): 0.5 mg via ORAL
  Filled 2012-01-01 (×5): qty 1

## 2012-01-01 MED ORDER — ACETAMINOPHEN 650 MG RE SUPP
975.0000 mg | Freq: Once | RECTAL | Status: AC
  Administered 2012-01-01: 975 mg via RECTAL
  Filled 2012-01-01: qty 1

## 2012-01-01 MED ORDER — TAMSULOSIN HCL 0.4 MG PO CAPS
0.4000 mg | ORAL_CAPSULE | Freq: Every day | ORAL | Status: DC
  Administered 2012-01-01 – 2012-01-03 (×3): 0.4 mg via ORAL
  Filled 2012-01-01 (×4): qty 1

## 2012-01-01 MED ORDER — ONDANSETRON HCL 4 MG/2ML IJ SOLN: 4.0000 mg | Freq: Three times a day (TID) | INTRAMUSCULAR | Status: AC | PRN

## 2012-01-01 MED ORDER — MORPHINE SULFATE 2 MG/ML IJ SOLN: 1.0000 mg | INTRAMUSCULAR | Status: DC | PRN

## 2012-01-01 MED ORDER — SODIUM CHLORIDE 0.9 % IV SOLN
INTRAVENOUS | Status: DC
  Administered 2012-01-01: 75 mL/h via INTRAVENOUS
  Administered 2012-01-01 – 2012-01-03 (×6): via INTRAVENOUS

## 2012-01-01 MED ORDER — ACETAMINOPHEN 325 MG PO TABS: 650.0000 mg | ORAL_TABLET | Freq: Four times a day (QID) | ORAL | Status: DC | PRN

## 2012-01-01 MED ORDER — ONDANSETRON HCL 4 MG/2ML IJ SOLN: 4.0000 mg | Freq: Four times a day (QID) | INTRAMUSCULAR | Status: DC | PRN

## 2012-01-01 MED ORDER — DOCUSATE SODIUM 100 MG PO CAPS
100.0000 mg | ORAL_CAPSULE | Freq: Three times a day (TID) | ORAL | Status: DC
  Administered 2012-01-01: 100 mg via ORAL
  Filled 2012-01-01 (×2): qty 1

## 2012-01-01 NOTE — ED Notes (Signed)
[name] (wife) [phone]

## 2012-01-01 NOTE — ED Notes (Signed)
Pt here from [facility] for eval of blood in urine and generalized weakness

## 2012-01-01 NOTE — H&P (Signed)
[facility] History and Physical  Ryan MARZETTE AVW:[id number] DOB: September 05, 1929 DOA: 01/01/2012  Referring physician: EDP PCP: Kirk Ruths, MD  Specialists: Dr. Karilyn Cota  Chief Complaint: Dehydration, AMS  HPI: [name] is a 78 y.o. 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 Karilyn Cota. The patient was discharged to [facility] skilled nursing facility on 12/22/2011 pretty continued to decline. He has evidence of terminal dysphasia with minimal by mouth intake in the ER was found to have severe dehydration and electrolyte abnormality. Per the discharge note a discussion about hospice with high prior to discharge but since the results of the pathology were pending the family wanted to have an attempt at improvement in rehabilitation. The patient has severe dysphagia and was discharged on a thick liquid diet, he has not had any water or significant oral intake since discharge.   Review of Systems: unable to obtain from patient.  Past Medical History  Diagnosis Date  . Stroke   . Hypertension   . Coronary artery disease 09/2004    With stenting  . Frequent falls 12/29/2010  . Dementia   . Anemia 12/29/2010  . Thrombocytopenia 12/29/2010  . Senile dementia with delirium 12/29/2010  . 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 Cerebellar artery occlusion or stenosis 12/29/2010  . Basilar artery stenosis 12/29/2010  . Thyroglossal duct cyst   . Dysphagia 12/31/2010  . Tardive dyskinesia 12/31/2010  . Radial neuropathy 01/01/2011  . Encephalopathy   . Biliary stricture   . Elevated LFTs   . Thyroglossal  duct cyst   . DNR (do not resuscitate)   . Failure to thrive   . 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 Radial neuropathy 01/01/2011   Past Surgical History  Procedure Date  . Cardiac surgery   . Coronary angioplasty   . Ercp 12/16/2011    Procedure: ENDOSCOPIC RETROGRADE CHOLANGIOPANCREATOGRAPHY (ERCP);  Surgeon: Malissa Hippo, MD;  Location: [facility];  Service: Endoscopy;  Laterality: N/A;  . Biliary stent placement 12/16/2011    Procedure: BILIARY STENT PLACEMENT;  Surgeon: Malissa Hippo, MD;  Location: [facility];  Service: Endoscopy;  Laterality: N/A;  . Spyglass cholangioscopy 12/16/2011    Procedure: SPYGLASS CHOLANGIOSCOPY;  Surgeon: Malissa Hippo, MD;  Location: [facility];  Service: Endoscopy;  Laterality: N/A;   Social History:  reports that he has never smoked. He has never used smokeless tobacco. He reports that he does not drink alcohol or use illicit drugs. SNF resident for past 2 weeks.   PPS: 20%  Allergies  Allergen Reactions  . Rocephin (Ceftriaxone) Itching    Family History  Problem Relation Age of Onset  . Cancer Father     throat  . Stroke Mother   . Stroke Brother      Prior to Admission medications   Medication Sig Start Date End Date Taking? Authorizing Provider  amoxicillin-clavulanate (AUGMENTIN) 875-125 MG per tablet Take 1 tablet by mouth 2 (two) times daily.   Yes Historical Provider, MD  aspirin EC 81 MG tablet Take 162 mg by mouth daily.   Yes Historical Provider, MD  clonazePAM (KLONOPIN) 0.5 MG tablet Take 0.25 mg by mouth 3 (  three) times daily as needed. 12/20/11  Yes Christiane Ha, MD  docusate sodium (COLACE) 100 MG capsule Take 100 mg by mouth 3 (three) times daily.     Yes Historical Provider, MD  megestrol (MEGACE) 40 MG/ML suspension Take 400 mg by mouth daily.   Yes Historical Provider, MD  metoprolol (LOPRESSOR) 50 MG tablet Take 50 mg by mouth daily. 09/17/11  Yes Hollice Espy, MD   omeprazole (PRILOSEC) 20 MG capsule Take 20 mg by mouth daily.     Yes Historical Provider, MD  piperacillin-tazobactam (ZOSYN) 3.375 GM/50ML IVPB Inject 3.375 g into the vein every 6 (six) hours.   Yes Historical Provider, MD  Tamsulosin HCl (FLOMAX) 0.4 MG CAPS Take 0.4 mg by mouth daily.   Yes Historical Provider, MD  traZODone (DESYREL) 50 MG tablet Take 50 mg by mouth once.   Yes Historical Provider, MD   Physical Exam: Filed Vitals:   01/01/12 1350 01/01/12 1706 01/01/12 2024 01/01/12 2300  BP:  152/87 156/74   Pulse:  118 104   Temp: 101.6 F (38.7 C)  98 F (36.7 C)   TempSrc: Rectal  Axillary   Resp:  18 20   Height:    5\' 3"  (1.6 m)  Weight:    48.6 kg (107 lb 2.3 oz)  SpO2:  100% 99%      General:  Minimally responsive -nonverbal, no jaundice  Eyes: Arcus senilis, post react  ENT: Very dry mucous membranes no  Neck: Supple  Cardiovascular: Tachycardic, no murmurs rubs or gallops normal S1-S2  Respiratory: Shallow respiration, clear  Abdomen: No masses palpated no ascites  Skin: No lesions or rashes  Musculoskeletal: Trace edema  Psychiatric: Encephalopathy present, flat affect slightly agitated  Neurologic: No new deficits, or focal to fully assess because patient is not following commands  Labs on Admission:  Basic Metabolic Panel:  Lab 01/01/12 1610  NA 156*  K 3.8  CL 122*  CO2 24  GLUCOSE 120*  BUN 27*  CREATININE 1.36*  CALCIUM 9.0  MG --  PHOS --   Liver Function Tests:  Lab 01/01/12 1333  AST 57*  ALT 76*  ALKPHOS 288*  BILITOT 3.2*  PROT 7.3  ALBUMIN 2.5*    Lab 01/01/12 1454  AMMONIA 20   CBC:  Lab 01/01/12 1333  WBC 18.8*  NEUTROABS 16.4*  HGB 11.4*  HCT 35.8*  MCV 98.9  PLT 116*   Cardiac Enzymes:  Lab 01/01/12 1333  CKTOTAL --  CKMB --  CKMBINDEX --  TROPONINI <0.30    BNP (last 3 results)  Basename 09/16/11 0826  PROBNP 481.3*   CBG: No results found for this basename: GLUCAP:5 in the last 168  hours  Radiological Exams on Admission: Dg Chest Port 1 View  01/01/2012  *RADIOLOGY REPORT*  Clinical Data: Cough, rule out infiltrate  PORTABLE CHEST - 1 VIEW  Comparison: 12/05/ 13  Findings: Cardiomediastinal silhouette is stable.  No acute infiltrate or pleural effusion.  No pulmonary edema. Bony thorax is stable.  IMPRESSION: No active disease.  No significant change.   Original Report Authenticated By: Natasha Mead, M.D.     EKG: pending at admission  Assessment/Plan Principal Problem:  *Dehydration Active Problems:  Fever  CVA, old, speech/language deficit  CAD (coronary artery disease)  Gait abnormality  Frequent falls  Anemia  Thrombocytopenia  Basilar artery stenosis  Thyroglossal duct cyst  Dysphagia  Encephalopathy  Chronic diastolic CHF (congestive heart failure)  Hypernatremia  Elevated LFTs  Dementia  Failure to thrive  Biliary stricture  Renal insufficiency  UTI (urinary tract infection)  Cancer of biliary tract  This patient is unfortunately approaching end-of-life related to his numerous end-stage chronic medical diseases and terminal dysphasia from his advanced dementia. 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 The patient's wife and sister were at the bedside I updated them on his condition and had a brief goals of care meeting to determine the course of this hospitalization. The patient is clearly dehydrated and has a significant free water deficit which is likely made worse by his modified diet of thickened liquids and nursing home status with inability to initiate or request fluids in response to any thirst drive. He has also had very poor by mouth intake which has been progressive over the past few months. Previously the family has stated they would never want a feeding tube and this is not indicated in advanced dementia. I noted that there had been  previous discussions about hospice prior to his last discharge, the patient's wife however appeared to be exhausted and frustrated in not in a place where she wanted to have a very detailed discussion about options-I did however express how very ill the patient truly is in that he may indeed be facing end-of-life and that comfort care should be considered in the context of this readmission. I discussed the option of home hospice as well as residential hospice. She does state that she wants him to be comfortable but was not at a place where she told me not to initiate antibiotics or a trial of IV fluids- she seems dissatisfied over the prior discharge and felt like it was too premature and that she did not want a skilled facility for her husband however she could no longer care for him at home because of his largely bedbound status. She wants to have a more detailed discussion tomorrow "we'll see what happens". I confirmed DO NOT RESUSCITATE.   We'll continue IV fluids  Comfort feeding with thin liquids with full aspiration precautions and supervision, he is severely hypernatremic so he needs free water if he is alert and able to do this  We'll continue IV antibiotics for urinary tract infection  May need to have GI get involved in order to fully characterize the potential for cancer of the hepatobiliary tract and the overall very poor prognosis  Terminal dysphasia no feeding tubes now or in the future.  Encephalopathy with delirium present in the previous hospitalization he had a fall related to agitation he will need constant supervision should he become agitated. We'll order benzodiazepines for sedation, has a history of tardive dyskinesia so we'll avoid Haldol unless necessary.  Resume medications for his chronic illness as tolerated  Address goals of care. Social work consult placed for possible hospice options.    Code Status: DO NOT RESUSCITATE Family Communication: Detailed discussion  with wife and sister at the bedside  Disposition Plan: Inpatient status. Discharge plans to be determined. Time spent: 80 minutes  [facility] Pager [phone]  If 7PM-7AM, please contact night-coverage [email or web address] Password [email or web address] 01/02/2012, 1:01 AM

## 2012-01-01 NOTE — ED Notes (Signed)
failed  attempt to do an in-and out catheter on pt; no urine returned

## 2012-01-01 NOTE — ED Notes (Signed)
Pt family denies any change in pt's condition from this a.m.

## 2012-01-01 NOTE — ED Provider Notes (Signed)
History     CSN: [id number]  Arrival date & time 01/01/12  1220   First MD Initiated Contact with Patient 01/01/12 1310      Chief Complaint  Patient presents with  . Weakness     Patient is a 78 y.o. male presenting with weakness. The history is provided by the spouse, the EMS personnel and the nursing home. The history is limited by the condition of the patient (Hx dementia).  Weakness  Pt was seen at 1315.  Per EMS, NH report and pt's family, family felt pt was "weaker than normal" but otherwise acting per his baseline dementia.  They told this to Dr. Selena Batten today, and the NH was to draw blood and obtain a urine sample.  NH staff states they were unable to so, and he was sent to the ED for eval.  Pt has significant hx of dementia.  No reported fevers, no reported N/V/D, no reported falls.     Past Medical History  Diagnosis Date  . Stroke   . Hypertension   . Coronary artery disease 09/2004    With stenting  . Frequent falls 12/29/2010  . Dementia   . Anemia 12/29/2010  . Thrombocytopenia 12/29/2010  . Senile dementia with delirium 12/29/2010  . 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 Cerebellar artery occlusion or stenosis 12/29/2010  . Basilar artery stenosis 12/29/2010  . Thyroglossal duct cyst   . Dysphagia 12/31/2010  . Tardive dyskinesia 12/31/2010  . Radial neuropathy 01/01/2011  . Encephalopathy   . Biliary stricture   . Elevated LFTs   . Thyroglossal duct cyst   . DNR (do not resuscitate)     Past Surgical History  Procedure Date  . Cardiac surgery   . Coronary angioplasty   . Ercp 12/16/2011    Procedure: ENDOSCOPIC RETROGRADE CHOLANGIOPANCREATOGRAPHY (ERCP);  Surgeon: Malissa Hippo, MD;  Location: [facility];  Service: Endoscopy;  Laterality: N/A;  . Biliary stent placement 12/16/2011    Procedure: BILIARY STENT PLACEMENT;  Surgeon: Malissa Hippo, MD;  Location: [facility];  Service: Endoscopy;  Laterality: N/A;  .  Spyglass cholangioscopy 12/16/2011    Procedure: SPYGLASS CHOLANGIOSCOPY;  Surgeon: Malissa Hippo, MD;  Location: [facility];  Service: Endoscopy;  Laterality: N/A;    Family History  Problem Relation Age of Onset  . Cancer Father     throat  . Stroke Mother   . Stroke Brother     History  Substance Use Topics  . Smoking status: Never Smoker   . Smokeless tobacco: Never Used  . Alcohol Use: No      Review of Systems  Unable to perform ROS: Dementia    Allergies  Review of patient's allergies indicates no known allergies.  Home Medications   Current Outpatient Rx  Name  Route  Sig  Dispense  Refill  . AMOXICILLIN-POT CLAVULANATE 875-125 MG PO TABS   Oral   Take 1 tablet by mouth 2 (two) times daily.         . ASPIRIN EC 81 MG PO TBEC   Oral   Take 162 mg by mouth daily.         Marland Kitchen CLONAZEPAM 0.5 MG PO TABS   Oral   Take 0.25 mg by mouth 3 (three) times daily as needed.         Marland Kitchen DOCUSATE SODIUM 100 MG PO CAPS   Oral   Take 100 mg by mouth 3 (three) times  daily.           . MEGESTROL ACETATE 40 MG/ML PO SUSP   Oral   Take 400 mg by mouth daily.         Marland Kitchen METOPROLOL TARTRATE 50 MG PO TABS   Oral   Take 50 mg by mouth daily.         Marland Kitchen OMEPRAZOLE 20 MG PO CPDR   Oral   Take 20 mg by mouth daily.           Marland Kitchen PIPERACILLIN-TAZOBACTAM 3.375 G IVPB   Intravenous   Inject 3.375 g into the vein every 6 (six) hours.         . TAMSULOSIN HCL 0.4 MG PO CAPS   Oral   Take 0.4 mg by mouth daily.         . TRAZODONE HCL 50 MG PO TABS   Oral   Take 50 mg by mouth once.           BP 148/95  Pulse 132  Temp 101.6 F (38.7 C) (Rectal)  Resp 18  SpO2 97%  Physical Exam 1320: Physical examination:  Nursing notes reviewed; Vital signs and O2 SAT reviewed; +febrile.; Constitutional: Thin, frail, In no acute distress; Head:  Normocephalic, atraumatic; Eyes: EOMI, PERRL, No scleral icterus; ENMT: Mouth and pharynx normal, Mucous membranes dry;  Neck: +prominent thyroglossal duct cyst. Supple, Full range of motion, No lymphadenopathy; Cardiovascular: Tachycardic rate and rhythm, No gallop; Respiratory: Breath sounds clear & equal bilaterally, No wheezes.  Speaking full sentences with ease, Normal respiratory effort/excursion; Chest: Nontender, Movement normal; Abdomen: Soft, Nontender, Nondistended, Normal bowel sounds; Genitourinary: No CVA tenderness; Extremities: Pulses normal, No deformity. No tenderness, No edema, No calf edema or asymmetry.; Neuro: Laying left side with eyes closed, moans when touched, moves all ext on stretcher spontaneously.; Skin: Color normal, Warm, Dry.   ED Course  Procedures    MDM  MDM Reviewed: nursing note, vitals and previous chart Reviewed previous: labs and ECG Interpretation: labs, ECG and x-ray      Date: 01/01/2012  Rate: 140  Rhythm: sinus tachycardia  QRS Axis: normal  Intervals: normal  ST/T Wave abnormalities: nonspecific ST/T changes  Conduction Disutrbances:none  Narrative Interpretation: LVH  Old EKG Reviewed: changes noted, new NS STTW changes compared to previus EKG dated 12/14/2011.  Results for orders placed during the hospital encounter of 01/01/12  BASIC METABOLIC PANEL      Component Value Range   Sodium 156 (*) 135 - 145 mEq/L   Potassium 3.8  3.5 - 5.1 mEq/L   Chloride 122 (*) 96 - 112 mEq/L   CO2 24  19 - 32 mEq/L   Glucose, Bld 120 (*) 70 - 99 mg/dL   BUN 27 (*) 6 - 23 mg/dL   Creatinine, Ser 5.78 (*) 0.50 - 1.35 mg/dL   Calcium 9.0  8.4 - 46.9 mg/dL   GFR calc non Af Amer 48 (*) >90 mL/min   GFR calc Af Amer 55 (*) >90 mL/min  CBC WITH DIFFERENTIAL      Component Value Range   WBC 18.8 (*) 4.0 - 10.5 K/uL   RBC 3.62 (*) 4.22 - 5.81 MIL/uL   Hemoglobin 11.4 (*) 13.0 - 17.0 g/dL   HCT 62.9 (*) 52.8 - 41.3 %   MCV 98.9  78.0 - 100.0 fL   MCH 31.5  26.0 - 34.0 pg   MCHC 31.8  30.0 - 36.0 g/dL   RDW 24.4  01.0 -  15.5 %   Platelets 116 (*) 150 - 400 K/uL    Neutrophils Relative 87 (*) 43 - 77 %   Lymphocytes Relative 5 (*) 12 - 46 %   Monocytes Relative 8  3 - 12 %   Eosinophils Relative 0  0 - 5 %   Basophils Relative 0  0 - 1 %   Neutro Abs 16.4 (*) 1.7 - 7.7 K/uL   Lymphs Abs 0.9  0.7 - 4.0 K/uL   Monocytes Absolute 1.5 (*) 0.1 - 1.0 K/uL   Eosinophils Absolute 0.0  0.0 - 0.7 K/uL   Basophils Absolute 0.0  0.0 - 0.1 K/uL   RBC Morphology POLYCHROMASIA PRESENT     WBC Morphology ATYPICAL LYMPHOCYTES     Smear Review LARGE PLATELETS PRESENT    URINALYSIS, ROUTINE W REFLEX MICROSCOPIC      Component Value Range   Color, Urine RED (*) YELLOW   APPearance HAZY (*) CLEAR   Specific Gravity, Urine 1.025  1.005 - 1.030   pH 5.0  5.0 - 8.0   Glucose, UA 100 (*) NEGATIVE mg/dL   Hgb urine dipstick LARGE (*) NEGATIVE   Bilirubin Urine NEGATIVE  NEGATIVE   Ketones, ur 15 (*) NEGATIVE mg/dL   Protein, ur >161 (*) NEGATIVE mg/dL   Urobilinogen, UA 4.0 (*) 0.0 - 1.0 mg/dL   Nitrite POSITIVE (*) NEGATIVE   Leukocytes, UA MODERATE (*) NEGATIVE  TROPONIN I      Component Value Range   Troponin I <0.30  <0.30 ng/mL  LACTIC ACID, PLASMA      Component Value Range   Lactic Acid, Venous 1.9  0.5 - 2.2 mmol/L  AMMONIA      Component Value Range   Ammonia 20  11 - 60 umol/L  HEPATIC FUNCTION PANEL      Component Value Range   Total Protein 7.3  6.0 - 8.3 g/dL   Albumin 2.5 (*) 3.5 - 5.2 g/dL   AST 57 (*) 0 - 37 U/L   ALT 76 (*) 0 - 53 U/L   Alkaline Phosphatase 288 (*) 39 - 117 U/L   Total Bilirubin 3.2 (*) 0.3 - 1.2 mg/dL   Bilirubin, Direct 2.1 (*) 0.0 - 0.3 mg/dL   Indirect Bilirubin 1.1 (*) 0.3 - 0.9 mg/dL  URINE MICROSCOPIC-ADD ON      Component Value Range   WBC, UA 11-20  <3 WBC/hpf   RBC / HPF TOO NUMEROUS TO COUNT  <3 RBC/hpf   Bacteria, UA FEW (*) RARE    Dg Chest Port 1 View 01/01/2012  *RADIOLOGY REPORT*  Clinical Data: Cough, rule out infiltrate  PORTABLE CHEST - 1 VIEW  Comparison: 12/05/ 13  Findings: Cardiomediastinal  silhouette is stable.  No acute infiltrate or pleural effusion.  No pulmonary edema. Bony thorax is stable.  IMPRESSION: No active disease.  No significant change.   Original Report Authenticated By: Natasha Mead, M.D.      Results for CREED, KAIL (MRN [id number]) as of 01/01/2012 18:34  Ref. Range 12/15/2011 05:48 12/16/2011 05:50 12/18/2011 06:50 12/19/2011 05:29 12/20/2011 05:08 01/01/2012 13:33  BUN Latest Range: 6-23 mg/dL 16 20 21 22  27  (H)  Creatinine Latest Range: 0.50-1.35 mg/dL 8.11 9.14 7.82 9.56  2.13 (H)  Alkaline Phosphatase Latest Range: 39-117 U/L 100 109 231 (H) 268 (H) 390 (H) 288 (H)  AST Latest Range: 0-37 U/L 40 (H) 43 (H) 74 (H) 69 (H) 86 (H) 57 (H)  ALT Latest Range: 0-53 U/L  89 (H) 86 (H) 109 (H) 99 (H) 109 (H) 76 (H)  Total Bilirubin Latest Range: 0.3-1.2 mg/dL 0.8 0.9 1.6 (H) 1.4 (H) 1.5 (H) 3.2 (H)      Results for HANNA, AULTMAN (MRN [id number]) as of 01/01/2012 18:34  Ref. Range 12/15/2011 05:48 12/16/2011 05:50 12/19/2011 05:29 01/01/2012 13:33  Hemoglobin Latest Range: 13.0-17.0 g/dL 46.9 (L) 62.9 (L) 52.8 (L) 11.4 (L)  HCT Latest Range: 39.0-52.0 % 32.7 (L) 35.5 (L) 33.7 (L) 35.8 (L)  Platelets Latest Range: 150-400 K/uL 86 (L) 92 (L) 101 (L) 116 (L)   Results for DAVED, MCFANN (MRN [id number]) as of 01/01/2012 18:34  Ref. Range 12/15/2011 05:48 12/16/2011 05:50 12/18/2011 06:50 12/19/2011 05:29 01/01/2012 13:33  Sodium Latest Range: 135-145 mEq/L 140 139 134 (L) 136 156 (H)  Chloride Latest Range: 96-112 mEq/L 113 (H) 110 102 107 122 (H)    1800:  +UTI, UC pending. Will treat.  Fever improved with APAP.  SBP remains 140-150's.  LFT's appear to be trending downward.  Clinically dehydrated.  Pt sitting up, eyes open, talking intermittently with family, confused per baseline. Dx and testing d/w pt's family.  Questions answered.  Verb understanding, agreeable to admit.  T/C to [facility] Dr. Karilyn Cota, case discussed, including:  HPI, pertinent PM/SHx, VS/PE, dx  testing, ED course and treatment:  Agreeable to admit, requests to write temporary orders, obtain medical bed to team 1.    Laray Anger, DO 01/02/12 1450

## 2012-01-02 DIAGNOSIS — F0390 Unspecified dementia without behavioral disturbance: Secondary | ICD-10-CM

## 2012-01-02 DIAGNOSIS — R7989 Other specified abnormal findings of blood chemistry: Secondary | ICD-10-CM

## 2012-01-02 DIAGNOSIS — R1310 Dysphagia, unspecified: Secondary | ICD-10-CM

## 2012-01-02 DIAGNOSIS — E860 Dehydration: Secondary | ICD-10-CM

## 2012-01-02 MED ORDER — CIPROFLOXACIN IN D5W 400 MG/200ML IV SOLN
INTRAVENOUS | Status: AC
  Filled 2012-01-02: qty 200

## 2012-01-02 MED ORDER — PNEUMOCOCCAL VAC POLYVALENT 25 MCG/0.5ML IJ INJ
0.5000 mL | INJECTION | INTRAMUSCULAR | Status: AC
  Administered 2012-01-03: 0.5 mL via INTRAMUSCULAR
  Filled 2012-01-02: qty 0.5

## 2012-01-02 MED ORDER — DOCUSATE SODIUM 50 MG/5ML PO LIQD
100.0000 mg | Freq: Three times a day (TID) | ORAL | Status: DC
  Administered 2012-01-02 – 2012-01-03 (×4): 100 mg via ORAL
  Filled 2012-01-02 (×12): qty 10

## 2012-01-02 NOTE — Progress Notes (Addendum)
     Subjective: This man was admitted yesterday with severe dehydration. 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           Physical Exam: Blood pressure 193/96, pulse 118, temperature 97.5 F (36.4 C), temperature source Axillary, resp. rate 20, height 5\' 3"  (1.6 m), weight 48.6 kg (107 lb 2.3 oz), SpO2 90.00%. He looks alert this morning. He looks clinically dehydrated. Heart sounds are present without murmurs. Lung fields are clear. Abdomen is soft nontender.   Investigations:  No results found for this or any previous visit (from the past 240 hour(s)).   Basic Metabolic Panel:  Basename 01/01/12 1333  NA 156*  K 3.8  CL 122*  CO2 24  GLUCOSE 120*  BUN 27*  CREATININE 1.36*  CALCIUM 9.0  MG --  PHOS --   Liver Function Tests:  [facility] 01/01/12 1333  AST 57*  ALT 76*  ALKPHOS 288*  BILITOT 3.2*  PROT 7.3  ALBUMIN 2.5*     CBC:  Basename 01/01/12 1333  WBC 18.8*  NEUTROABS 16.4*  HGB 11.4*  HCT 35.8*  MCV 98.9  PLT 116*    Dg Chest Port 1 View  01/01/2012  *RADIOLOGY REPORT*  Clinical Data: Cough, rule out infiltrate  PORTABLE CHEST - 1 VIEW  Comparison: 12/05/ 13  Findings: Cardiomediastinal silhouette is stable.  No acute infiltrate or pleural effusion.  No pulmonary edema. Bony thorax is stable.  IMPRESSION: No active disease.  No significant change.   Original Report Authenticated By: Natasha Mead, M.D.       Medications: I have reviewed the patient's current medications.  Impression: 1. Dehydration. 2. End-stage  dementia. 3. 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 Stent in place. 4. Hypernatremia secondary to #1.     Plan: 1. Continue with IV fluids today. 2. I will discuss further goals of care with the patient's wife tomorrow, as it was quite apparent that she was  exhausted yesterday and not receptive to this conversation.     LOS: 1 day   Wilson Singer Pager [phone]  01/02/2012, 11:42 AM

## 2012-01-02 NOTE — Clinical Social Work Placement (Signed)
Clinical Social Work Department CLINICAL SOCIAL WORK PLACEMENT NOTE 01/02/2012  Patient:  [name], [name]  Account Number:  [id number] Admit date:  01/01/2012  Clinical Social Worker:  Derenda Fennel, LCSW  Date/time:  01/02/2012 12:35 PM  Clinical Social Work is seeking post-discharge placement for this patient at the following level of care:   SKILLED NURSING   (*CSW will update this form in Epic as items are completed)   01/02/2012  Patient/family provided with [facility] Department of Clinical Social Work's list of facilities offering this level of care within the geographic area requested by the patient (or if unable, by the patient's family).  01/02/2012  Patient/family informed of their freedom to choose among providers that offer the needed level of care, that participate in Medicare, Medicaid or managed care program needed by the patient, have an available bed and are willing to accept the patient.  01/02/2012  Patient/family informed of [facility]' ownership interest in [facility], as well as of the fact that they are under no obligation to receive care at this facility.  PASARR submitted to EDS on  PASARR number received from EDS on   FL2 transmitted to all facilities in geographic area requested by pt/family on  01/02/2012 FL2 transmitted to all facilities within larger geographic area on 01/02/2012  Patient informed that his/her managed care company has contracts with or will negotiate with  certain facilities, including the following:     Patient/family informed of bed offers received:   Patient chooses bed at  Physician recommends and patient chooses bed at    Patient to be transferred to  on   Patient to be transferred to facility by   The following physician request were entered in Epic:   Additional Comments: existing pasarr  Derenda Fennel, LCSW [phone]

## 2012-01-02 NOTE — Care Management Note (Signed)
    Page 1 of 1   01/05/2012     2:40:43 PM   CARE MANAGEMENT NOTE 01/05/2012  Patient:  FENTON, CANDEE   Account Number:  [id number]  Date Initiated:  01/02/2012  Documentation initiated by:  Sharrie Rothman  Subjective/Objective Assessment:   Pt admitted from [facility]. Pt will return at discharge.     Action/Plan:   CSW will arrange discharge to facility when medically stable.   Anticipated DC Date:  01/05/2012   Anticipated DC Plan:  SKILLED NURSING FACILITY  In-house referral  Clinical Social Worker      DC Planning Services  CM consult      Choice offered to / List presented to:             Status of service:  Completed, signed off Medicare Important Message given?   (If response is "NO", the following Medicare IM given date fields will be blank) Date Medicare IM given:   Date Additional Medicare IM given:    Discharge Disposition:  SKILLED NURSING FACILITY  Per UR Regulation:    If discussed at Long Length of Stay Meetings, dates discussed:    Comments:  01/05/12 Rosemary Holms RN BNS CM DC to [facility] in [place]. Unable to return to [facility] since her insurance coverage is changing to [facility] and they are not in network.  01/02/12 1055 Arlyss Queen, RN BSN CM

## 2012-01-02 NOTE — Clinical Social Work Psychosocial (Signed)
Clinical Social Work Department BRIEF PSYCHOSOCIAL ASSESSMENT 01/02/2012  Patient:  [name], [name]     Account Number:  [id number]     Admit date:  01/01/2012  Clinical Social Worker:  Nancie Neas  Date/Time:  01/02/2012 12:40 PM  Referred by:  Physician  Date Referred:  01/02/2012 Referred for  SNF Placement   Other Referral:   Interview type:  Family Other interview type:   wife    PSYCHOSOCIAL DATA Living Status:  FACILITY Admitted from facility:  [facility] Level of care:  Skilled Nursing Facility Primary support name:  [name] Primary support relationship to patient:  SPOUSE Degree of support available:   supportive    CURRENT CONCERNS Current Concerns  Post-Acute Placement   Other Concerns:    SOCIAL WORK ASSESSMENT / PLAN CSW spoke with pt's wife as pt was asleep at CSW visit. Pt's wife is very involved and supportive and they are known to CSW from previous admission earlier this month. Pt d/c from hospital to [facility] and has been working with PT per wife. EDP spoke with her about considering comfort care and she was overwhelmed at the time and did not want to discuss but tells CSW today she wants him to go to SNF and does not want comfort care right now. Pt's insurance will be changing to [facility] at the first of the year and [facility] does not have a contract with them. Pt's wife definitely wants to keep [facility] and is requesting CSW initiate new bed search accepting this insurance. She is aware as of January 1, [facility] Medicare will have to authorize his stay at [facility]. Debbie at [facility] is aware of insurance change and had spoken to pt's wife about this already. CSW will fax out FL2 and follow up with bed offers when available.   Assessment/plan status:  Psychosocial Support/Ongoing Assessment of Needs Other assessment/ plan:   Information/referral to community resources:   SNF list    PATIENT'S/FAMILY'S RESPONSE TO PLAN OF CARE: Pt unable to  discuss plan of care. Pt's wife reports things were going well at [facility], but pt will need new facility due to insurance changes.      Derenda Fennel, [place] [phone]

## 2012-01-02 NOTE — Progress Notes (Signed)
Patient is currently active with [facility] Care Management for chronic disease management services.  Patient has been engaged by a [employer]. Will continue to monitor disposition.  Of note, [facility] Care Management services does not replace or interfere with any services that are arranged by inpatient case management or social work.  For additional questions or referrals please contact Anibal Henderson BSN RN [facility] Liaison at [phone].

## 2012-01-03 DIAGNOSIS — E870 Hyperosmolality and hypernatremia: Principal | ICD-10-CM

## 2012-01-03 LAB — BASIC METABOLIC PANEL
CO2: 22 mEq/L (ref 19–32)
GFR calc non Af Amer: 40 mL/min — ABNORMAL LOW (ref >90)
Glucose, Bld: 136 mg/dL — ABNORMAL HIGH (ref 70–99)
Potassium: 3 mEq/L — ABNORMAL LOW (ref 3.5–5.1)
Sodium: 151 mEq/L — ABNORMAL HIGH (ref 135–145)

## 2012-01-03 LAB — URINE CULTURE

## 2012-01-03 LAB — CBC
HCT: 29 % — ABNORMAL LOW (ref 39.0–52.0)
MCHC: 32.1 g/dL (ref 30.0–36.0)
MCV: 98 fL (ref 78.0–100.0)
RDW: 13.7 % (ref 11.5–15.5)

## 2012-01-03 LAB — COMPREHENSIVE METABOLIC PANEL
Albumin: 1.9 g/dL — ABNORMAL LOW (ref 3.5–5.2)
BUN: 25 mg/dL — ABNORMAL HIGH (ref 6–23)
Creatinine, Ser: 1.59 mg/dL — ABNORMAL HIGH (ref 0.50–1.35)
GFR calc Af Amer: 46 mL/min — ABNORMAL LOW (ref >90)
Glucose, Bld: 77 mg/dL (ref 70–99)
Total Bilirubin: 1.6 mg/dL — ABNORMAL HIGH (ref 0.3–1.2)
Total Protein: 5.6 g/dL — ABNORMAL LOW (ref 6.0–8.3)

## 2012-01-03 MED ORDER — PANTOPRAZOLE SODIUM 40 MG PO PACK
40.0000 mg | PACK | Freq: Every day | ORAL | Status: DC
  Filled 2012-01-03 (×5): qty 20

## 2012-01-03 MED ORDER — METOPROLOL TARTRATE 50 MG PO TABS
50.0000 mg | ORAL_TABLET | Freq: Two times a day (BID) | ORAL | Status: DC
  Filled 2012-01-03: qty 1

## 2012-01-03 MED ORDER — METOPROLOL TARTRATE 1 MG/ML IV SOLN
5.0000 mg | Freq: Four times a day (QID) | INTRAVENOUS | Status: DC
  Administered 2012-01-03 – 2012-01-04 (×4): 5 mg via INTRAVENOUS
  Filled 2012-01-03 (×4): qty 5

## 2012-01-03 MED ORDER — POTASSIUM CHLORIDE 2 MEQ/ML IV SOLN
INTRAVENOUS | Status: DC
  Administered 2012-01-03: 12:00:00 via INTRAVENOUS
  Filled 2012-01-03 (×7): qty 1000

## 2012-01-03 MED ORDER — POTASSIUM CHLORIDE 2 MEQ/ML IV SOLN
INTRAVENOUS | Status: DC
  Administered 2012-01-03 – 2012-01-04 (×3): via INTRAVENOUS
  Filled 2012-01-03 (×7): qty 1000

## 2012-01-03 MED ORDER — DOCUSATE SODIUM 50 MG/5ML PO LIQD
ORAL | Status: AC
  Filled 2012-01-03: qty 10

## 2012-01-03 NOTE — Progress Notes (Signed)
     Subjective: This man was admitted  with severe dehydration. 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 Urine culture is negative for growth. He remains alert, but I do not believe is taking much by mouth.           Physical Exam: Blood pressure 177/86, pulse 98, temperature 97.9 F (36.6 C), temperature source Axillary, resp. rate 20, height 5\' 3"  (1.6 m), weight 48.6 kg (107 lb 2.3 oz), SpO2 96.00%. He looks alert this morning. He looks clinically dehydrated. Heart sounds are present without murmurs. Lung fields are clear. Abdomen is soft nontender.   Investigations:  Recent Results (from the past 240 hour(s))  URINE CULTURE     Status: Normal   Collection Time   01/01/12  5:01 PM      Component Value Range Status Comment   Specimen Description URINE, CATHETERIZED   Final    Special Requests NONE   Final    Culture  Setup Time 01/01/2012 22:17   Final    Colony Count NO GROWTH   Final    Culture NO GROWTH   Final    Report Status 01/03/2012 FINAL   Final      Basic Metabolic Panel:  Basename 01/03/12 0535 01/01/12 1333  NA 154* 156*  K 3.0* 3.8  CL 125* 122*  CO2 22 24  GLUCOSE 77 120*  BUN 25* 27*  CREATININE 1.59* 1.36*  CALCIUM 8.0* 9.0  MG -- --  PHOS -- --   Liver Function Tests:  [facility] 01/03/12 0535 01/01/12 1333  AST 25 57*  ALT 37 76*  ALKPHOS 193* 288*  BILITOT 1.6* 3.2*  PROT 5.6* 7.3  ALBUMIN 1.9* 2.5*     CBC:  Basename 01/03/12 0535 01/01/12 1333  WBC 10.0 18.8*  NEUTROABS -- 16.4*  HGB 9.3* 11.4*  HCT 29.0* 35.8*  MCV 98.0 98.9  PLT 90* 116*    Dg Chest Port 1 View  01/01/2012  *RADIOLOGY REPORT*  Clinical Data: Cough, rule out infiltrate  PORTABLE CHEST - 1 VIEW  Comparison: 12/05/ 13  Findings: Cardiomediastinal silhouette is stable.  No acute infiltrate or pleural effusion.  No pulmonary edema. Bony  thorax is stable.  IMPRESSION: No active disease.  No significant change.   Original Report Authenticated By: Natasha Mead, M.D.       Medications: I have reviewed the patient's current medications.  Impression: 1. Dehydration. 2. End-stage  dementia. 3. 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 Stent in place. 4. Hypernatremia secondary to #1, persistent. 5. Hypokalemia.     Plan: 1. Change intravenous fluids to D5 with potassium. 2. I did have a brief conversation with the patient's wife yesterday on the phone. She is not really interested in talking about hospice care at the present time although I think he is clearly a candidate for this.     LOS: 2 days   Wilson Singer Pager [phone]  01/03/2012, 8:05 AM

## 2012-01-03 NOTE — Evaluation (Signed)
Physical Therapy Evaluation Patient Details Name: [name] MRN: [id number] DOB: [birth year] Today's Date: 01/03/2012 Time: 4098-1191 PT Time Calculation (min): 20 min  PT Assessment / Plan / Recommendation Clinical Impression  Pt is an 78 year old male admitted to [facility] for dehydration w/end stage deminta.  At this time requires total A for all mobility and needs multimodal cueing for appropriate sequencing.  Performed STS 3x for activity today and continued to require total A from elevated surface.      PT Assessment  Patient needs continued PT services    Follow Up Recommendations  SNF    Does the patient have the potential to tolerate intense rehabilitation      Barriers to Discharge Inaccessible home environment      Equipment Recommendations  None recommended by PT    Recommendations for Other Services     Frequency Min 3X/week    Precautions / Restrictions     Pertinent Vitals/Pain Unable to report      Mobility  Bed Mobility Bed Mobility: Rolling Right;Supine to Sit Rolling Right: 1: +1 Total assist;With rail Supine to Sit: 1: +1 Total assist;HOB elevated Details for Bed Mobility Assistance: pt unable to follow any directions using TC and VC for handplacement and technique.  Transfers Transfers: Sit to Stand;Stand to Sit Sit to Stand: 1: +1 Total assist;From bed Stand to Sit: 1: +1 Total assist;To bed Details for Transfer Assistance: Pt unable to follow commands.  Pt able to stand with therapist and wife w/total A in order for nursing to change linens. Performed 3x Ambulation/Gait Ambulation/Gait Assistance: Not tested (comment)    Shoulder Instructions     Exercises     PT Diagnosis: Difficulty walking;Generalized weakness  PT Problem List: Decreased strength;Decreased activity tolerance;Decreased mobility PT Treatment Interventions: Gait training;Functional mobility training;Therapeutic activities;Patient/family education   PT Goals Acute Rehab  PT Goals PT Goal Formulation: With patient/family Time For Goal Achievement: 01/10/12 Potential to Achieve Goals: Fair Pt will go Supine/Side to Sit: with mod assist;with HOB not 0 degrees (comment degree) PT Goal: Supine/Side to Sit - Progress: Goal set today Pt will go Sit to Supine/Side: with mod assist;with HOB not 0 degrees (comment degree) PT Goal: Sit to Supine/Side - Progress: Goal set today Pt will go Sit to Stand: with max assist;with upper extremity assist PT Goal: Sit to Stand - Progress: Goal set today Pt will Transfer Bed to Chair/Chair to Bed: with max assist PT Transfer Goal: Bed to Chair/Chair to Bed - Progress: Goal set today  Visit Information  Last PT Received On: 01/03/12 Assistance Needed: +1    Subjective Data  Subjective: Pt wife reports that he was doing really well at SNF.  He was able to stand and put the cones on top of one another.   Prior Functioning  Home Living Available Help at Discharge: Skilled Nursing Facility Home Adaptive Equipment: Dan Humphreys - four wheeled Prior Function Level of Independence: Needs assistance Needs Assistance: Gait;Transfers Gait Assistance: RW w/A Transfer Assistance: RW w/A Driving: No    Cognition  Overall Cognitive Status: History of cognitive impairments - further impaired Area of Impairment: Following commands;Safety/judgement;Attention Arousal/Alertness: Awake/alert Orientation Level: Disoriented to;Place;Time;Situation Following Commands: Follows one step commands inconsistently    Extremity/Trunk Assessment Right Lower Extremity Assessment RLE ROM/Strength/Tone: Unable to fully assess;Due to impaired cognition (able to move LE but seems to have increased ext tone) Left Lower Extremity Assessment LLE ROM/Strength/Tone: Unable to fully assess;Due to impaired cognition   Balance Balance Balance Assessed:  Yes Static Sitting Balance Static Sitting - Balance Support: Bilateral upper extremity supported Static  Sitting - Level of Assistance: 1: +1 Total assist Static Sitting - Comment/# of Minutes: Total A to sit EOB while nursing bathe front and back x 5 minutes Static Standing Balance Static Standing - Comment/# of Minutes: Total A x1 minute while nursing changes linens.   End of Session PT - End of Session Equipment Utilized During Treatment: Gait belt Patient left: in bed Nurse Communication: Mobility status   Takeem Krotzer, PT 01/03/2012, 9:07 AM

## 2012-01-03 NOTE — Clinical Social Work Note (Signed)
CSW presented bed offers to pt's wife. Only facility available at this time is [facility] which pt's wife reports is a good distance from home. She will accept offer but would like CSW to check again on Thursday for any openings in [place] or [place]. Facility notified and will talk with sister facility in [place] to see if a transfer could be arranged if bed opens up there.   Derenda Fennel, [place] [phone]

## 2012-01-03 NOTE — Progress Notes (Signed)
UR Chart Review Completed  

## 2012-01-03 NOTE — Clinical Social Work Placement (Signed)
Clinical Social Work Department CLINICAL SOCIAL WORK PLACEMENT NOTE 01/03/2012  Patient:  [name], [name]  Account Number:  [id number] Admit date:  01/01/2012  Clinical Social Worker:  Derenda Fennel, LCSW  Date/time:  01/02/2012 12:35 PM  Clinical Social Work is seeking post-discharge placement for this patient at the following level of care:   SKILLED NURSING   (*CSW will update this form in Epic as items are completed)   01/02/2012  Patient/family provided with [facility] Department of Clinical Social Work's list of facilities offering this level of care within the geographic area requested by the patient (or if unable, by the patient's family).  01/02/2012  Patient/family informed of their freedom to choose among providers that offer the needed level of care, that participate in Medicare, Medicaid or managed care program needed by the patient, have an available bed and are willing to accept the patient.  01/02/2012  Patient/family informed of [facility]' ownership interest in [facility], as well as of the fact that they are under no obligation to receive care at this facility.  PASARR submitted to [employer] on  PASARR number received from [employer] on   FL2 transmitted to all facilities in geographic area requested by pt/family on  01/02/2012 FL2 transmitted to all facilities within larger geographic area on 01/02/2012  Patient informed that his/her managed care company has contracts with or will negotiate with  certain facilities, including the following:     Patient/family informed of bed offers received:  01/03/2012 Patient chooses bed at [facility] Physician recommends and patient chooses bed at  [facility]  Patient to be transferred to  on   Patient to be transferred to facility by   The following physician request were entered in Epic:   Additional Comments: existing pasarr  Derenda Fennel, LCSW [phone]

## 2012-01-04 LAB — COMPREHENSIVE METABOLIC PANEL
ALT: 34 U/L (ref 0–53)
Albumin: 2 g/dL — ABNORMAL LOW (ref 3.5–5.2)
Alkaline Phosphatase: 208 U/L — ABNORMAL HIGH (ref 39–117)
BUN: 17 mg/dL (ref 6–23)
Chloride: 118 mEq/L — ABNORMAL HIGH (ref 96–112)
Potassium: 4.1 mEq/L (ref 3.5–5.1)
Sodium: 144 mEq/L (ref 135–145)
Total Bilirubin: 1.5 mg/dL — ABNORMAL HIGH (ref 0.3–1.2)
Total Protein: 5.9 g/dL — ABNORMAL LOW (ref 6.0–8.3)

## 2012-01-04 LAB — CBC
HCT: 30.6 % — ABNORMAL LOW (ref 39.0–52.0)
MCH: 31 pg (ref 26.0–34.0)
MCHC: 31.7 g/dL (ref 30.0–36.0)
MCV: 97.8 fL (ref 78.0–100.0)
Platelets: 103 10*3/uL — ABNORMAL LOW (ref 150–400)
RDW: 13.7 % (ref 11.5–15.5)
WBC: 13.2 10*3/uL — ABNORMAL HIGH (ref 4.0–10.5)

## 2012-01-04 MED ORDER — METOPROLOL TARTRATE 1 MG/ML IV SOLN
5.0000 mg | INTRAVENOUS | Status: DC
  Administered 2012-01-04 – 2012-01-05 (×6): 5 mg via INTRAVENOUS
  Filled 2012-01-04 (×6): qty 5

## 2012-01-04 NOTE — Progress Notes (Addendum)
Pt. Has been very sleepy today, from Xanax given at 0355 this am, no po medications given this shift. Pt. Starting to wake up, family at side.

## 2012-01-04 NOTE — Progress Notes (Signed)
     Subjective: This man was admitted  with severe dehydration. 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 Urine culture is negative for growth. He remains alert, but I do not believe is taking much by mouth. Today his electrolytes are much improved. His blood pressure remains elevated.           Physical Exam: Blood pressure 157/86, pulse 93, temperature 98.2 F (36.8 C), temperature source Axillary, resp. rate 18, height 5\' 3"  (1.6 m), weight 48.6 kg (107 lb 2.3 oz), SpO2 99.00%. He is currently some lessened. He looks clinically better hydrated. Heart sounds are present without murmurs. Lung fields are clear. Abdomen is soft nontender.   Investigations:  Recent Results (from the past 240 hour(s))  URINE CULTURE     Status: Normal   Collection Time   01/01/12  5:01 PM      Component Value Range Status Comment   Specimen Description URINE, CATHETERIZED   Final    Special Requests NONE   Final    Culture  Setup Time 01/01/2012 22:17   Final    Colony Count NO GROWTH   Final    Culture NO GROWTH   Final    Report Status 01/03/2012 FINAL   Final   MRSA PCR SCREENING     Status: Normal   Collection Time   01/03/12  6:20 PM      Component Value Range Status Comment   MRSA by PCR NEGATIVE  NEGATIVE Final      Basic Metabolic Panel:  Basename 01/04/12 0516 01/03/12 1253  NA 144 151*  K 4.1 3.0*  CL 118* 124*  CO2 20 22  GLUCOSE 138* 136*  BUN 17 24*  CREATININE 1.41* 1.56*  CALCIUM 8.1* 7.9*  MG -- --  PHOS -- --   Liver Function Tests:  [facility] 01/04/12 0516 01/03/12 0535  AST 28 25  ALT 34 37  ALKPHOS 208* 193*  BILITOT 1.5* 1.6*  PROT 5.9* 5.6*  ALBUMIN 2.0* 1.9*     CBC:  Basename 01/04/12 0516 01/03/12 0535 01/01/12 1333  WBC 13.2* 10.0 --  NEUTROABS -- -- 16.4*  HGB 9.7* 9.3* --  HCT 30.6* 29.0* --  MCV 97.8 98.0 --  PLT  103* 90* --    No results found.    Medications: I have reviewed the patient's current medications.  Impression: 1. Dehydration, now improving. 2. End-stage  dementia. 3. 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 Stent in place. 4. Hypernatremia secondary to #1, persistent, resolved..      Plan: 1. Reduce intravenous fluids to 75 cc an hour. 2. If is stable, consider discharge to a skilled nursing facility tomorrow.    LOS: 3 days   Wilson Singer Pager [phone]  01/04/2012, 8:05 AM

## 2012-01-05 ENCOUNTER — Inpatient Hospital Stay (HOSPITAL_COMMUNITY): Payer: Medicare Other

## 2012-01-05 LAB — CBC
MCH: 31.4 pg (ref 26.0–34.0)
Platelets: 97 10*3/uL — ABNORMAL LOW (ref 150–400)
RBC: 2.83 MIL/uL — ABNORMAL LOW (ref 4.22–5.81)
WBC: 12.6 10*3/uL — ABNORMAL HIGH (ref 4.0–10.5)

## 2012-01-05 LAB — COMPREHENSIVE METABOLIC PANEL
ALT: 27 U/L (ref 0–53)
AST: 27 U/L (ref 0–37)
CO2: 20 mEq/L (ref 19–32)
Calcium: 8 mg/dL — ABNORMAL LOW (ref 8.4–10.5)
Chloride: 115 mEq/L — ABNORMAL HIGH (ref 96–112)
GFR calc non Af Amer: 48 mL/min — ABNORMAL LOW (ref >90)
Sodium: 141 mEq/L (ref 135–145)

## 2012-01-05 MED ORDER — CLONAZEPAM 0.5 MG PO TABS: 0.2500 mg | ORAL_TABLET | Freq: Three times a day (TID) | ORAL | Status: AC | PRN

## 2012-01-05 MED ORDER — SODIUM CHLORIDE 0.9 % IJ SOLN
3.0000 mL | Freq: Two times a day (BID) | INTRAMUSCULAR | Status: DC
  Administered 2012-01-05: 3 mL via INTRAVENOUS

## 2012-01-05 MED ORDER — SODIUM CHLORIDE 0.9 % IV SOLN
250.0000 mL | INTRAVENOUS | Status: DC | PRN
  Administered 2012-01-05: 250 mL via INTRAVENOUS

## 2012-01-05 MED ORDER — SODIUM CHLORIDE 0.9 % IJ SOLN: 3.0000 mL | INTRAMUSCULAR | Status: DC | PRN

## 2012-01-05 NOTE — Clinical Social Work Note (Signed)
Patient ready for discharge today to SNF, patient will be transported via [facility] to [facility]. Patient unable to give consent, wife agreeable to transport although she preferred patient to remain in [place] or [place].  CSW verified no beds available in facilities in either town. Facility agreeable to patient transfer today. Wife asked to go to facility and sign paperwork for admission, wife spoke w Lawanna Kobus, Admissions rep.  FL2 reviewed w RN, discharge summary and PT notes faxed to facility via [facility].  Discharge packet prepared and placed w shadow chart for transport.  RN asked to call report to facility.  Santa Genera, LCSW Clinical Social Worker [phone])

## 2012-01-05 NOTE — Discharge Summary (Signed)
Physician Discharge Summary  [name]:[id number] DOB: October 10, 1929 DOA: 01/01/2012  PCP: Kirk Ruths, MD  Admit date: 01/01/2012 Discharge date: 01/05/2012  Time spent: Greater than 30 minutes    Discharge Diagnoses:  1. Dehydration, resolved. 2. 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 3. Biliary stricture, status post stent, atypical cells seen on breast and. 4. Hypernatremia secondary to #1, resolved. 5. Protein calorie malnutrition, severe albumin 2.0. 5. Palliative performance score of 30% at best, mid  to long-term prognosis poor.   Discharge Condition: Stable.  Diet recommendation: Pured with honey thickened liquids by teaspoon.  Filed Weights   01/01/12 2300  Weight: 48.6 kg (107 lb 2.3 oz)    History of present illness:  This 78 year old man, who is well-known to our hospital, who has end-stage vascular dementia, presented to the hospital with symptoms of dehydration and altered mental status. Please see initial history as outlined below: HPI: [name] is a 78 y.o. 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 Karilyn Cota. The patient was discharged to [facility] skilled nursing facility on 12/22/2011 pretty continued to decline. He has evidence of terminal dysphasia with minimal by mouth intake in the ER was found to have severe dehydration and electrolyte abnormality. Per the discharge note a discussion about hospice with high prior to discharge but since the results of the pathology were pending the family wanted to have an attempt at improvement in rehabilitation. The patient has severe dysphagia and was discharged on a  thick liquid diet, he has not had any water or significant oral intake since discharge.   Hospital Course:  Patient was admitted and started on intravenous fluids and intravenous antibiotics for possible UTI. However urine culture was negative. Gradually he is sodium improved and normalized as his state of hydration.Marland Kitchen However, he has had fairly poor by mouth intake and his level of mentation is rather poor at the present time. I've had an extensive discussion with the patient's wife on this admission and on previous admission, explaining that this patient has end-stage dementia and his ability to maintain hydration is rather poor. I had recommended hospice care. The patient's wife did not wish to pursue this at this time. Patient is DO NOT RESUSCITATE. He is now stable for discharge, however I am very doubtful as to whether he will be able to maintain hydration going forward. I would not be surprised if he is readmitted within the next month or so.  Procedures:  None.   Consultations:  None.  Discharge Exam: Filed Vitals:   01/04/12 1944 01/04/12 2336 01/05/12 0450 01/05/12 1135  BP: 128/79 150/92 155/86 133/60  Pulse: 108 93 82 86  Temp: 99.1 F (37.3 C) 99.7 F (37.6 C) 98.7 F (37.1 C)   TempSrc: Oral Oral Oral   Resp: 18 18 20    Height:      Weight:      SpO2: 95% 97% 98%     General: He looks chronically sick and somewhat cachectic/malnourished. Cardiovascular: Heart sounds are present without any gallop rhythm. There are no murmurs. Respiratory: Lung fields are clinically clear. He is at the present time somewhat drowsy. However he is arousable. There are no focal neurological signs.  Discharge Instructions  Discharge  Orders    Future Orders Please Complete By Expires   Diet - low sodium heart healthy      Increase activity slowly          Medication List     As of 01/05/2012  1:19 PM    STOP taking these medications         amoxicillin-clavulanate 875-125 MG  per tablet   Commonly known as: AUGMENTIN      aspirin EC 81 MG tablet      megestrol 40 MG/ML suspension   Commonly known as: MEGACE      piperacillin-tazobactam 3.375 GM/50ML IVPB   Commonly known as: ZOSYN      traZODone 50 MG tablet   Commonly known as: DESYREL      TAKE these medications         clonazePAM 0.5 MG tablet   Commonly known as: KLONOPIN   Take 0.5 tablets (0.25 mg total) by mouth 3 (three) times daily as needed.      docusate sodium 100 MG capsule   Commonly known as: COLACE   Take 100 mg by mouth 3 (three) times daily.      metoprolol 50 MG tablet   Commonly known as: LOPRESSOR   Take 50 mg by mouth daily.      omeprazole 20 MG capsule   Commonly known as: PRILOSEC   Take 20 mg by mouth daily.      Tamsulosin HCl 0.4 MG Caps   Commonly known as: FLOMAX   Take 0.4 mg by mouth daily.          The results of significant diagnostics from this hospitalization (including imaging, microbiology, ancillary and laboratory) are listed below for reference.    Significant Diagnostic Studies: Dg Chest 1 View  12/13/2011  *RADIOLOGY REPORT*  Clinical Data: Altered mental status  CHEST - 1 VIEW  Comparison: 09/15/2011 and 12/13/2011  Findings: Cardiomediastinal silhouette is stable.  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  No acute infiltrate or pleural effusion.  No pulmonary edema. Atherosclerotic calcifications of thoracic aorta again noted.  IMPRESSION: No active disease.  No significant change.   Original Report Authenticated By: Natasha Mead, M.D.    Ct Head Wo Contrast  12/13/2011  *RADIOLOGY REPORT*  Clinical Data: Dementia.  Weakness.  Altered mental status. Hypertension.  CT HEAD WITHOUT CONTRAST  Technique:  Contiguous axial images were obtained from the base of the skull through the vertex without contrast.  Comparison: 09/16/2011 MR.  09/15/2011 CT.  Findings: No intracranial hemorrhage.  Significant confluent white matter type changes periventricular and  subcortical region.  This limits detection of small acute infarct. No CT evidence of large acute infarct.  Global atrophy without hydrocephalus.  No intracranial mass lesion detected on this unenhanced exam.  Vascular calcifications.  Polypoid opacification right maxillary sinus.  IMPRESSION: No intracranial hemorrhage.  Significant small vessel disease type changes limits detection of small acute infarct however, no CT evidence of large acute infarct.   Original Report Authenticated By: Lacy Duverney, M.D.    Mr Mrcp  12/15/2011  *RADIOLOGY REPORT*  Clinical Data:  Elevated liver function studies with nausea. Dementia.  MRI ABDOMEN WITHOUT CONTRAST (MRCP)  Technique: Multiplanar multisequence MR imaging of the abdomen was performed, including heavily T2-weighted images of the biliary and pancreatic ducts.  Three-dimensional MR images were rendered by post processing of the original MR data.  Comparison:  Abdominal CT 09/03/2010 and 03/23/2004.  Right upper quadrant abdominal ultrasound 12/13/2011.  Findings:  Study  is mildly limited by breathing artifact due to the patient's dementia.  Intravenous contrast was not administered.  There is moderate diffuse intrahepatic biliary dilatation.  There is an abrupt change in the caliber of the ductal system within the hepatic hilum with a focal high-grade stricture measuring 9 mm in length.  The common bile duct within and immediately superior to the pancreatic head is normal in caliber.  There is no definite evidence of choledocholithiasis.  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 The gallbladder appears normal.  The pancreas and pancreatic duct appear normal.  There is no evidence of pancreas divisum.  No focal hepatic abnormalities are seen.  The spleen and adrenal glands appear normal.  Bilateral renal cysts are unchanged.  There is possible diffuse gastric wall thickening versus incomplete distension.  No  enlarged lymph nodes are seen within the upper abdomen.  IMPRESSION:  1.  Moderate diffuse intrahepatic biliary dilatation with high- grade long segment stricture near the hepatic hilum.  No definite evidence of choledocholithiasis.  Findings are concerning for a malignant stricture (probably a cholangiocarcinoma).  Detail is limited by breathing artifact and the lack of intravenous contrast. 2.  The pancreas appears normal.  There is no evidence of metastatic disease. 3.  Possible diffuse gastric wall thickening versus incomplete distension.  The patient may be able to better tolerate abdominal CT with contrast if further imaging is desired prior to ERCP.  I have left a telephone message to discuss these findings with Dr. Karilyn Cota.   Original Report Authenticated By: Carey Bullocks, M.D.    Mr 3d Recon At Scanner  12/15/2011  *RADIOLOGY REPORT*  Clinical Data:  Elevated liver function studies with nausea. Dementia.  MRI ABDOMEN WITHOUT CONTRAST (MRCP)  Technique: Multiplanar multisequence MR imaging of the abdomen was performed, including heavily T2-weighted images of the biliary and pancreatic ducts.  Three-dimensional MR images were rendered by post processing of the original MR data.  Comparison:  Abdominal CT 09/03/2010 and 03/23/2004.  Right upper quadrant abdominal ultrasound 12/13/2011.  Findings:  Study is mildly limited by breathing artifact due to the patient's dementia.  Intravenous contrast was not administered.  There is moderate diffuse intrahepatic biliary dilatation.  There is an abrupt change in the caliber of the ductal system within the hepatic hilum with a focal high-grade stricture measuring 9 mm in length.  The common bile duct within and immediately superior to the pancreatic head is normal in caliber.  There is no definite evidence of choledocholithiasis.  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 The gallbladder appears normal.  The  pancreas and pancreatic duct appear normal.  There is no evidence of pancreas divisum.  No focal hepatic abnormalities are seen.  The spleen and adrenal glands appear normal.  Bilateral renal cysts are unchanged.  There is possible diffuse gastric wall thickening versus incomplete distension.  No enlarged lymph nodes are seen within the upper abdomen.  IMPRESSION:  1.  Moderate diffuse intrahepatic biliary dilatation with high- grade long segment stricture near the hepatic hilum.  No definite evidence of choledocholithiasis.  Findings are concerning for a malignant stricture (probably a cholangiocarcinoma).  Detail is limited by breathing artifact and the lack of intravenous contrast. 2.  The pancreas appears normal.  There is no evidence of metastatic disease. 3.  Possible diffuse gastric wall thickening versus incomplete distension.  The patient may be able to better  tolerate abdominal CT with contrast if further imaging is desired prior to ERCP.  I have left a telephone message to discuss these findings with Dr. Karilyn Cota.   Original Report Authenticated By: Carey Bullocks, M.D.    Dg Chest Port 1 View  01/05/2012  *RADIOLOGY REPORT*  Clinical Data: Dehydration, dementia, biliary stricture with suspected cholangiocarcinoma, question pneumonia  PORTABLE CHEST - 1 VIEW  Comparison: Portable exam 0734 hours compared to 01/01/2012  Findings: Upper normal heart size. Atherosclerotic calcification of a tortuous thoracic aorta. Slight pulmonary vascular congestion. No acute failure or consolidation. No pleural effusion or pneumothorax. Bones unremarkable.  IMPRESSION: No acute abnormalities.   Original Report Authenticated By: Ulyses Southward, M.D.    Dg Chest Port 1 View  01/01/2012  *RADIOLOGY REPORT*  Clinical Data: Cough, rule out infiltrate  PORTABLE CHEST - 1 VIEW  Comparison: 12/05/ 13  Findings: Cardiomediastinal silhouette is stable.  No acute infiltrate or pleural effusion.  No pulmonary edema. Bony thorax is  stable.  IMPRESSION: No active disease.  No significant change.   Original Report Authenticated By: Natasha Mead, M.D.    Dg Chest Port 1 View  12/15/2011  *RADIOLOGY REPORT*  Clinical Data: Chest congestion.  PORTABLE CHEST - 1 VIEW  Comparison: 12/13/2011  Findings: Reverse lordotic projection noted.  Density projecting over the upper chest likely represents the anterior neck cystic lesion previously worked up on ultrasound of 09/06/2010.  Atherosclerotic calcification of the aortic arch noted with mild aortic tortuosity.  Accounting for the projection, the lungs appear otherwise clear.  IMPRESSION:  1.  The lungs appear clear. 2.  Aortic atherosclerosis. 3.  Density projecting over the upper mediastinum is believed to reflect the known cystic lesion in the lower neck.   Original Report Authenticated By: Gaylyn Rong, M.D.    Dg Ercp  12/16/2011  *RADIOLOGY REPORT*  Clinical Data: Common bile duct stricture.  ERCP ERCP performed.  Fluoro time, 4 minutes and 48 seconds.  Comparison:  MRI, 12/15/2011  Technique:  Multiple spot images obtained with the fluoroscopic device and submitted for interpretation post-procedure.  ERCP was performed by Dr. Karilyn Cota.  Findings: Only two images were provided.  Initial image shows placement the scope with a follow-up image showing placement of a long common bile duct stent.  By report, no stones are seen. Brushing cytology was performed.  IMPRESSION: ERCP with stent placement across the long common duct stricture.  These images were submitted for radiologic interpretation only. Please see the procedural report for the amount of contrast and the fluoroscopy time utilized.   Original Report Authenticated By: Amie Portland, M.D.    Dg Abd Portable 1v  12/18/2011  *RADIOLOGY REPORT*  Clinical Data: 78 year old male - evaluate biliary stent.  PORTABLE ABDOMEN - 1 VIEW  Comparison: 12/16/2011 ERCP and stent placement.  Findings: A CBD stent is identified overlying the right  abdomen, with mild narrowing along its midportion. No other definite complicating features are noted.  Bowel gas pattern is unremarkable. No acute bony abnormalities are present.  IMPRESSION: CBD stent overlying the right upper abdomen with narrowing of its midportion.   Original Report Authenticated By: Harmon Pier, M.D.    US Abdomen Limited Ruq  12/13/2011  *RADIOLOGY REPORT*  Clinical Data:  Elevated LFTs, question cholelithiasis  LIMITED ABDOMINAL ULTRASOUND - RIGHT UPPER QUADRANT  Comparison:  None  Findings:  Gallbladder:  Suboptimally distended.  Small amount of probable dependent sludge with mild wall thickening, which could be due to edema or to underdistension.  No sonographic Murphy's sign or definite pericholecystic fluid.  No definite discrete shadowing calculi identified.  Common bile duct:  Dilated 11 mm diameter.  Questionable intraluminal echogenic focus within CBD, question artifact, unable to exclude choledocolithiasis.  Liver:  Normal parenchymal echogenicity.  Intrahepatic biliary dilatation.  No focal mass lesion.  No right upper quadrant ascites identified. Visualized portion of pancreas unremarkable.  IMPRESSION: Intrahepatic and extrahepatic biliary dilatation with questionable intraluminal echogenic focus/calculus versus artifact. In the setting of elevated LFTs, consider follow-up MRCP imaging. Incompletely distended gallbladder with a slightly thickened wall and likely dependent sludge. No definite gallstones or sonographic Murphy's sign identified.                    Original Report Authenticated By: Ulyses Southward, M.D.     Microbiology: Recent Results (from the past 240 hour(s))  URINE CULTURE     Status: Normal   Collection Time   01/01/12  5:01 PM      Component Value Range Status Comment   Specimen Description URINE, CATHETERIZED   Final    Special Requests NONE   Final    Culture  Setup Time 01/01/2012 22:17   Final    Colony Count NO GROWTH   Final    Culture NO GROWTH    Final    Report Status 01/03/2012 FINAL   Final   MRSA PCR SCREENING     Status: Normal   Collection Time   01/03/12  6:20 PM      Component Value Range Status Comment   MRSA by PCR NEGATIVE  NEGATIVE Final      Labs: Basic Metabolic Panel:  Lab 01/05/12 1610 01/04/12 0516 01/03/12 1253 01/03/12 0535 01/01/12 1333  NA 141 144 151* 154* 156*  K 5.0 4.1 3.0* 3.0* 3.8  CL 115* 118* 124* 125* 122*  CO2 20 20 22 22 24   GLUCOSE 93 138* 136* 77 120*  BUN 14 17 24* 25* 27*  CREATININE 1.35 1.41* 1.56* 1.59* 1.36*  CALCIUM 8.0* 8.1* 7.9* 8.0* 9.0  MG -- -- -- -- --  PHOS -- -- -- -- --   Liver Function Tests:  Lab 01/05/12 0410 01/04/12 0516 01/03/12 0535 01/01/12 1333  AST 27 28 25  57*  ALT 27 34 37 76*  ALKPHOS 186* 208* 193* 288*  BILITOT 1.2 1.5* 1.6* 3.2*  PROT 5.6* 5.9* 5.6* 7.3  ALBUMIN 1.7* 2.0* 1.9* 2.5*     Lab 01/01/12 1454  AMMONIA 20   CBC:  Lab 01/05/12 0410 01/04/12 0516 01/03/12 0535 01/01/12 1333  WBC 12.6* 13.2* 10.0 18.8*  NEUTROABS -- -- -- 16.4*  HGB 8.9* 9.7* 9.3* 11.4*  HCT 27.6* 30.6* 29.0* 35.8*  MCV 97.5 97.8 98.0 98.9  PLT 97* 103* 90* 116*   Cardiac Enzymes:  Lab 01/01/12 1333  CKTOTAL --  CKMB --  CKMBINDEX --  TROPONINI <0.30   BNP: BNP (last 3 results)  Basename 09/16/11 0826  PROBNP 481.3*         Signed:  Deanna Wiater C  [facility] 01/05/2012, 1:19 PM

## 2012-01-05 NOTE — Clinical Social Work Note (Signed)
Clinical Social Work Department CLINICAL SOCIAL WORK PLACEMENT NOTE 01/05/2012  Patient:  [name], [name]  Account Number:  [id number] Admit date:  01/01/2012  Clinical Social Worker:  Sherrlyn Hock  Date/time:  01/02/2012 12:35 PM  Clinical Social Work is seeking post-discharge placement for this patient at the following level of care:   SKILLED NURSING   (*CSW will update this form in Epic as items are completed)   01/02/2012  Patient/family provided with [facility] Department of Clinical Social Work's list of facilities offering this level of care within the geographic area requested by the patient (or if unable, by the patient's family).  01/02/2012  Patient/family informed of their freedom to choose among providers that offer the needed level of care, that participate in Medicare, Medicaid or managed care program needed by the patient, have an available bed and are willing to accept the patient.  01/02/2012  Patient/family informed of [facility]' ownership interest in [facility], as well as of the fact that they are under no obligation to receive care at this facility.  PASARR submitted to E[employer] on  PASARR number received from [employer] on   FL2 transmitted to all facilities in geographic area requested by pt/family on  01/02/2012 FL2 transmitted to all facilities within larger geographic area on 01/02/2012  Patient informed that his/her managed care company has contracts with or will negotiate with  certain facilities, including the following:     Patient/family informed of bed offers received:  01/03/2012 Patient chooses bed at [facility] Physician recommends and patient chooses bed at  [facility]  Patient to be transferred to [facility] [place] on  01/05/2012 Patient to be transferred to facility by [facility]  The following physician request were entered in Epic:   Additional Comments: existing  pasarr  Santa Genera, LCSW Clinical Social Worker [phone])

## 2012-01-05 NOTE — Progress Notes (Signed)
Report given to Alfonzo Beers, LPN at [facility].

## 2012-01-05 NOTE — Clinical Social Work Note (Signed)
Updated patient's wife and daughter on bed availability.  At present, patient has one bed offer at [facility].  Family willing to accept this offer, and admissions at [facility] is willing to facilitate patient transfer to facility in [place] as soon as bed opens up there.  Wife requested Admissions at [facility] contact her to arrange time to complete preadmit paperwork at facility.  Wife would like to go to SNF to complete paperwork then meet husband when he is admitted to SNF.  Plan would be to transfer patient via EMS.  Santa Genera, LCSW Clinical Social Worker [phone])

## 2012-01-05 NOTE — Progress Notes (Signed)
PT Cancellation Note  Patient Details Name: [name] MRN: [id number] DOB: 1929-12-03   Cancelled Treatment:    Reason Eval/Treat Not Completed: Patient's level of consciousness; attempted therapy this morning however patient very lethargic and only responding with eyes opening for short periods of time. While arousing patient he did spit up a small amount of vomit which needed to be suctioned out and patient cleaned.   Evalene Vath ATKINSO 01/05/2012, 9:56 AM

## 2012-02-11 DEATH — deceased
